# Patient Record
Sex: Male | Born: 1950 | Race: White | Hispanic: No | Marital: Married | Smoking: Never smoker
Health system: Southern US, Community
[De-identification: ages and names within clinical notes are randomized; demographics above are authoritative.]

## PROBLEM LIST (undated history)

## (undated) DIAGNOSIS — Z860101 Personal history of adenomatous and serrated colon polyps: Secondary | ICD-10-CM

## (undated) DIAGNOSIS — I10 Essential (primary) hypertension: Secondary | ICD-10-CM

## (undated) DIAGNOSIS — G250 Essential tremor: Secondary | ICD-10-CM

## (undated) DIAGNOSIS — M1990 Unspecified osteoarthritis, unspecified site: Secondary | ICD-10-CM

## (undated) DIAGNOSIS — C4491 Basal cell carcinoma of skin, unspecified: Secondary | ICD-10-CM

## (undated) DIAGNOSIS — C439 Malignant melanoma of skin, unspecified: Secondary | ICD-10-CM

## (undated) DIAGNOSIS — G709 Myoneural disorder, unspecified: Secondary | ICD-10-CM

## (undated) DIAGNOSIS — K5790 Diverticulosis of intestine, part unspecified, without perforation or abscess without bleeding: Secondary | ICD-10-CM

## (undated) DIAGNOSIS — Z86010 Personal history of colonic polyps: Secondary | ICD-10-CM

## (undated) HISTORY — DX: Myoneural disorder, unspecified: G70.9

## (undated) HISTORY — DX: Diverticulosis of intestine, part unspecified, without perforation or abscess without bleeding: K57.90

## (undated) HISTORY — DX: Personal history of adenomatous and serrated colon polyps: Z86.0101

## (undated) HISTORY — DX: Malignant melanoma of skin, unspecified: C43.9

## (undated) HISTORY — DX: Essential (primary) hypertension: I10

## (undated) HISTORY — PX: COSMETIC SURGERY: SHX468

## (undated) HISTORY — DX: Essential tremor: G25.0

## (undated) HISTORY — DX: Unspecified osteoarthritis, unspecified site: M19.90

## (undated) HISTORY — DX: Basal cell carcinoma of skin, unspecified: C44.91

## (undated) HISTORY — PX: COLONOSCOPY W/ POLYPECTOMY: SHX1380

## (undated) HISTORY — DX: Personal history of colonic polyps: Z86.010

---

## 2002-08-16 ENCOUNTER — Encounter: Payer: Self-pay | Admitting: Orthopedic Surgery

## 2002-08-16 ENCOUNTER — Encounter: Admission: RE | Admit: 2002-08-16 | Discharge: 2002-08-16 | Payer: Self-pay | Admitting: Orthopedic Surgery

## 2002-09-13 ENCOUNTER — Ambulatory Visit (HOSPITAL_BASED_OUTPATIENT_CLINIC_OR_DEPARTMENT_OTHER): Admission: RE | Admit: 2002-09-13 | Discharge: 2002-09-13 | Payer: Self-pay | Admitting: Orthopedic Surgery

## 2002-10-04 HISTORY — PX: SHOULDER ARTHROSCOPY W/ ROTATOR CUFF REPAIR: SHX2400

## 2004-11-10 DIAGNOSIS — C4492 Squamous cell carcinoma of skin, unspecified: Secondary | ICD-10-CM

## 2004-11-10 HISTORY — DX: Squamous cell carcinoma of skin, unspecified: C44.92

## 2006-08-02 ENCOUNTER — Encounter: Admission: RE | Admit: 2006-08-02 | Discharge: 2006-08-02 | Payer: Self-pay | Admitting: Orthopedic Surgery

## 2006-09-21 ENCOUNTER — Ambulatory Visit (HOSPITAL_BASED_OUTPATIENT_CLINIC_OR_DEPARTMENT_OTHER): Admission: RE | Admit: 2006-09-21 | Discharge: 2006-09-21 | Payer: Self-pay | Admitting: Orthopedic Surgery

## 2007-05-15 DIAGNOSIS — C4492 Squamous cell carcinoma of skin, unspecified: Secondary | ICD-10-CM | POA: Insufficient documentation

## 2007-05-15 HISTORY — DX: Squamous cell carcinoma of skin, unspecified: C44.92

## 2009-05-07 ENCOUNTER — Inpatient Hospital Stay (HOSPITAL_COMMUNITY): Admission: RE | Admit: 2009-05-07 | Discharge: 2009-05-09 | Payer: Self-pay | Admitting: Orthopedic Surgery

## 2010-10-04 HISTORY — PX: KNEE SURGERY: SHX244

## 2010-10-28 IMAGING — CR DG CHEST 2V
2 series · 2 of 2 positions shown · non-contrast
Comparison: None

CLINICAL DATA: Preop for degenerative change of the right knee

CHEST - 2 VIEW

[view not recorded (1 of 2)]
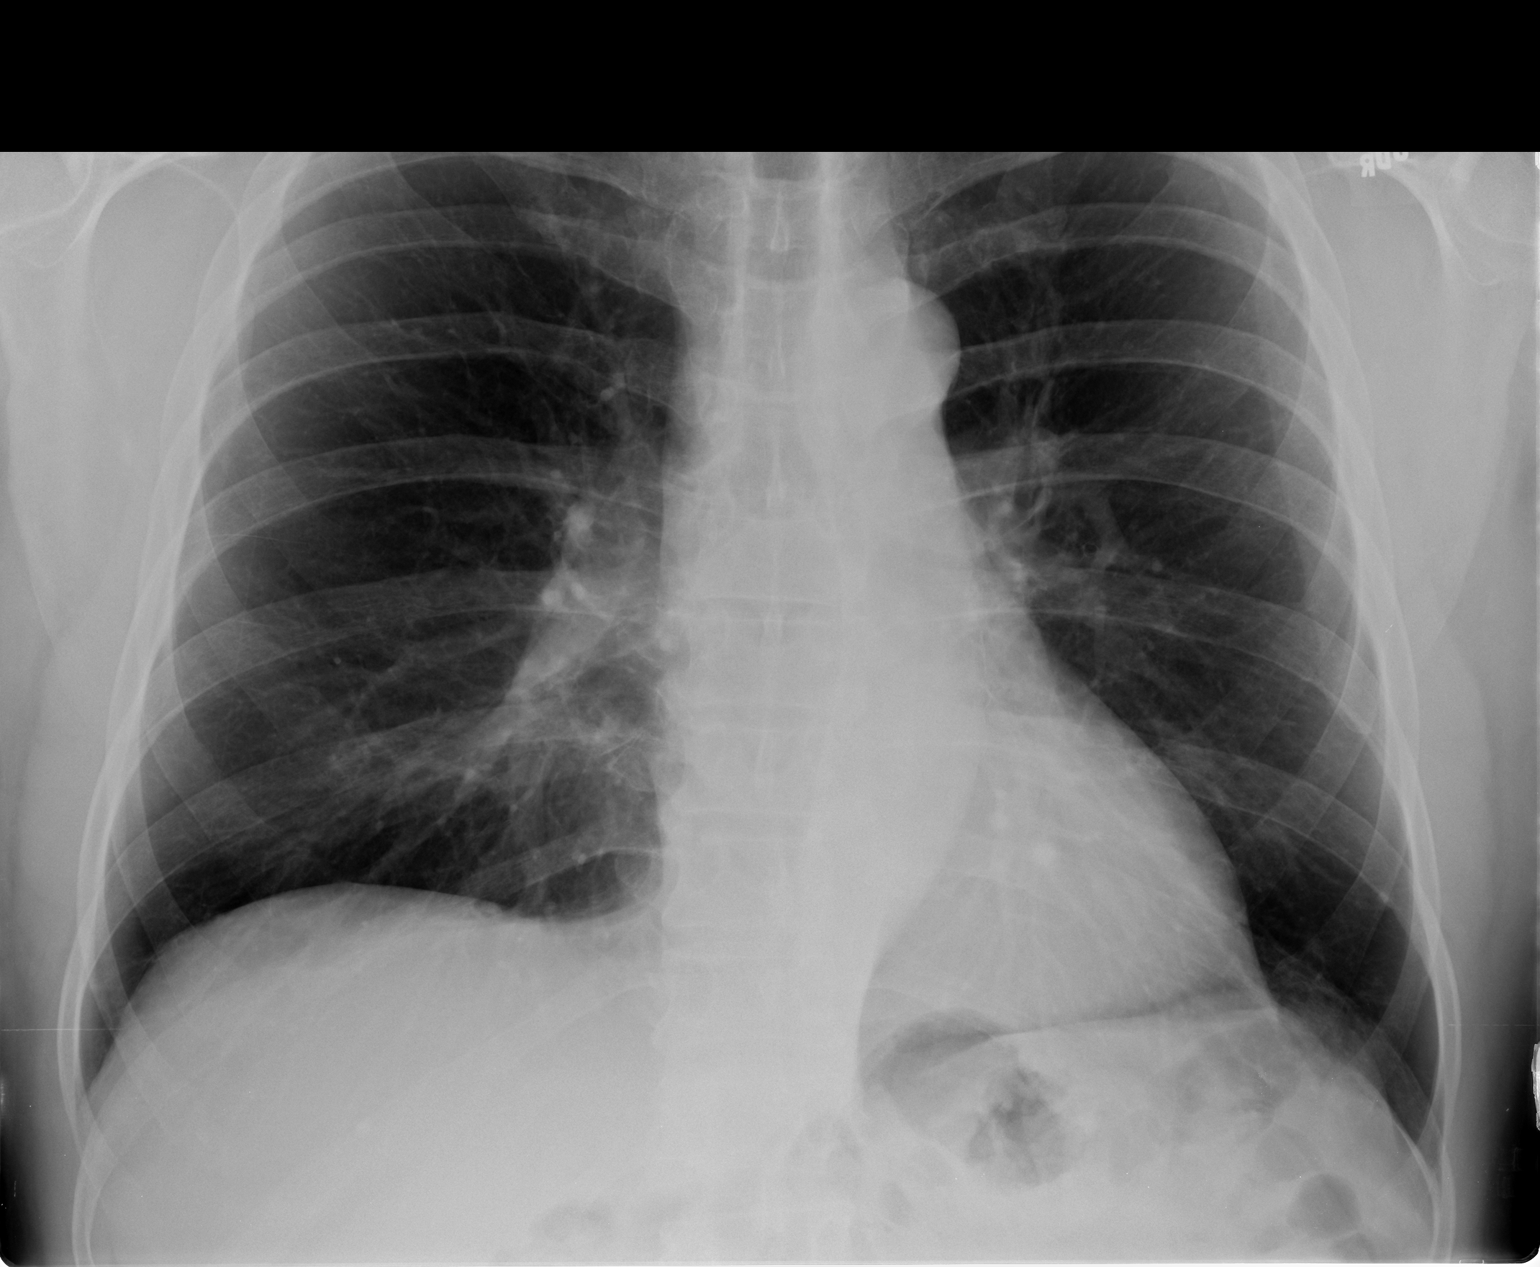

[view not recorded (2 of 2)]
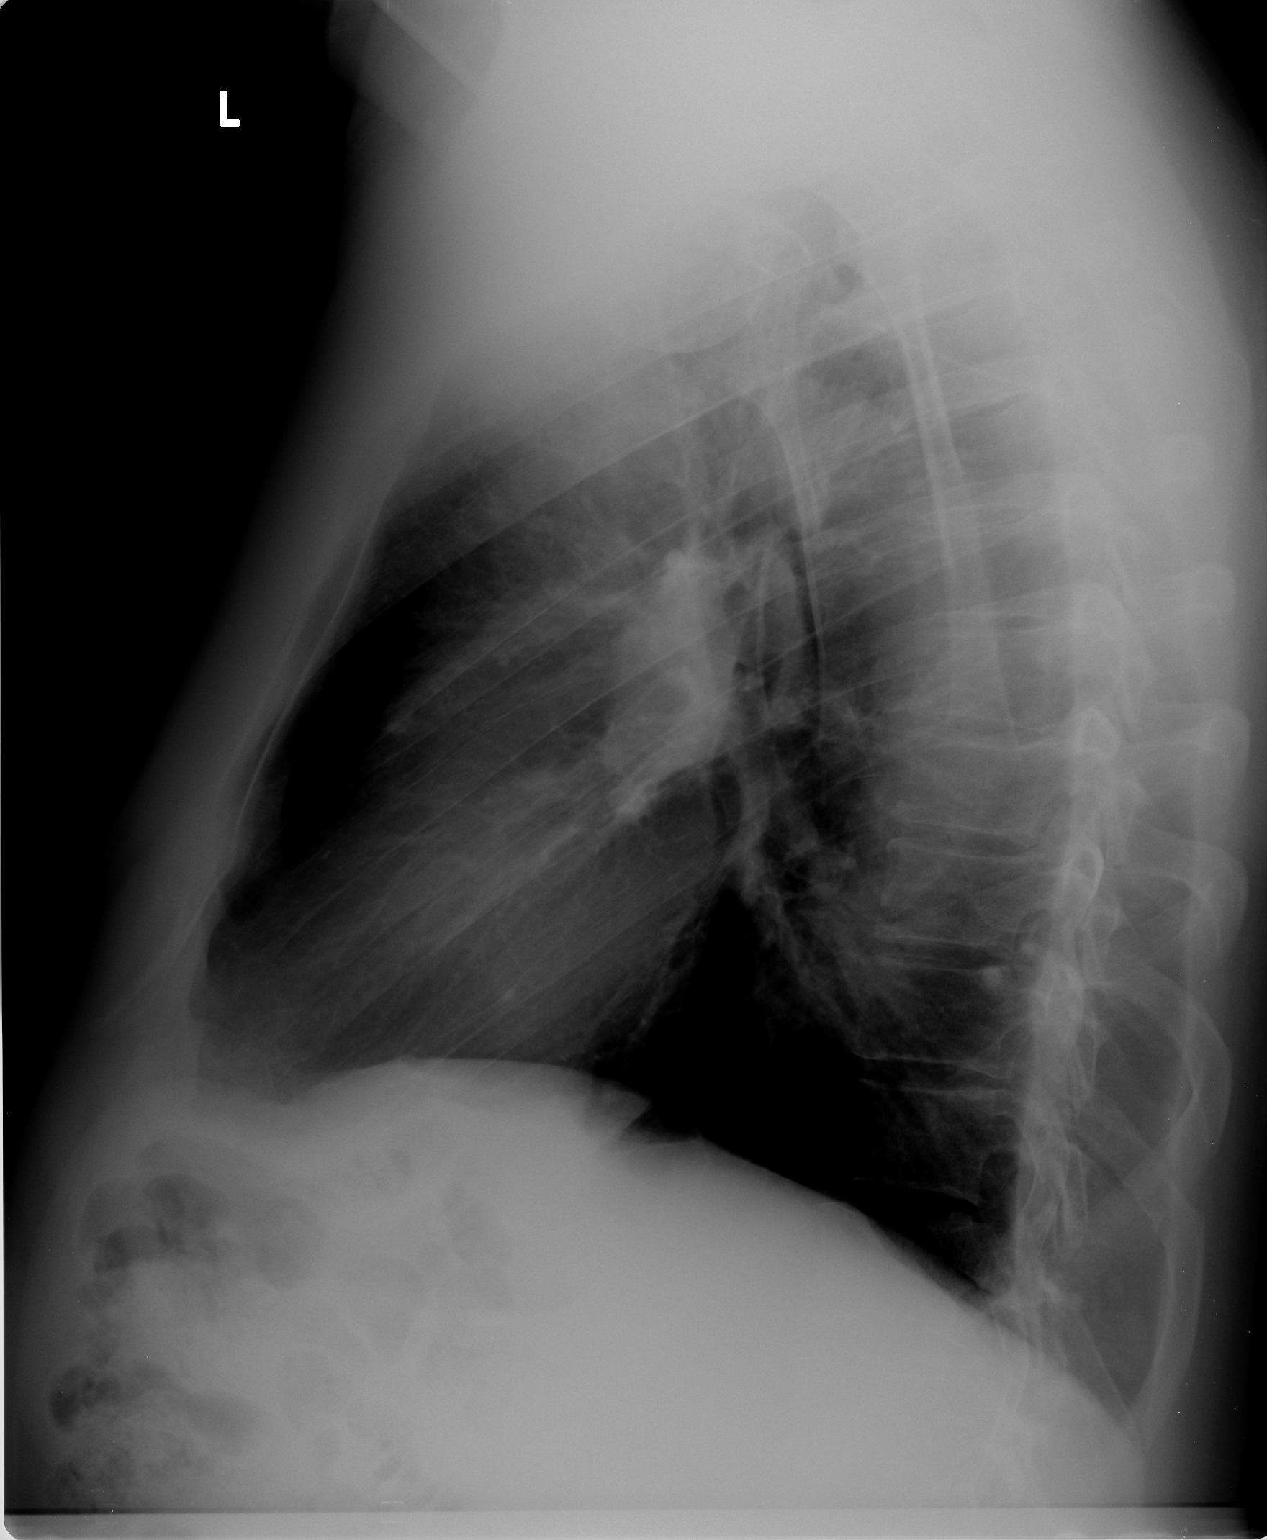

[2 of 2 positions shown; findings below may reference images not displayed]

FINDINGS: Small somewhat dense nodules are noted bilaterally most
consistent with granulomas, partially calcified.  No active
infiltrate or effusion is seen.  The heart is within normal limits
in size.  There are degenerative changes in the thoracic spine.
IMPRESSION: No active lung disease.  Probable faintly calcified granulomas
bilaterally.  Compare with prior or follow-up chest x-ray.

## 2011-01-09 LAB — PROTIME-INR
INR: 1.1 (ref 0.00–1.49)
Prothrombin Time: 13.8 seconds (ref 11.6–15.2)
Prothrombin Time: 14.9 seconds (ref 11.6–15.2)

## 2011-01-09 LAB — BASIC METABOLIC PANEL
BUN: 6 mg/dL (ref 6–23)
CO2: 29 mEq/L (ref 19–32)
Calcium: 8.1 mg/dL — ABNORMAL LOW (ref 8.4–10.5)
Creatinine, Ser: 0.79 mg/dL (ref 0.4–1.5)
Creatinine, Ser: 0.87 mg/dL (ref 0.4–1.5)
GFR calc Af Amer: >60 mL/min (ref >60)
GFR calc Af Amer: >60 mL/min (ref >60)
GFR calc non Af Amer: >60 mL/min (ref >60)
GFR calc non Af Amer: >60 mL/min (ref >60)
Potassium: 3.4 mEq/L — ABNORMAL LOW (ref 3.5–5.1)
Sodium: 135 mEq/L (ref 135–145)

## 2011-01-09 LAB — CBC
MCHC: 35.5 g/dL (ref 30.0–36.0)
MCV: 95.2 fL (ref 78.0–100.0)
Platelets: 148 10*3/uL — ABNORMAL LOW (ref 150–400)
Platelets: 183 10*3/uL (ref 150–400)
RBC: 3.41 MIL/uL — ABNORMAL LOW (ref 4.22–5.81)
RBC: 3.67 MIL/uL — ABNORMAL LOW (ref 4.22–5.81)
RBC: 4.47 MIL/uL (ref 4.22–5.81)
RDW: 12.6 % (ref 11.5–15.5)
WBC: 5 10*3/uL (ref 4.0–10.5)
WBC: 7.1 10*3/uL (ref 4.0–10.5)

## 2011-01-09 LAB — TYPE AND SCREEN
ABO/RH(D): O NEG
Antibody Screen: NEGATIVE

## 2011-01-10 LAB — COMPREHENSIVE METABOLIC PANEL
ALT: 27 U/L (ref 0–53)
AST: 22 U/L (ref 0–37)
Albumin: 4.3 g/dL (ref 3.5–5.2)
Calcium: 9.9 mg/dL (ref 8.4–10.5)
GFR calc Af Amer: >60 mL/min (ref >60)
Sodium: 141 mEq/L (ref 135–145)
Total Protein: 6.9 g/dL (ref 6.0–8.3)

## 2011-01-10 LAB — URINALYSIS, ROUTINE W REFLEX MICROSCOPIC
Glucose, UA: NEGATIVE mg/dL
Ketones, ur: NEGATIVE mg/dL
Protein, ur: NEGATIVE mg/dL
Urobilinogen, UA: 0.2 mg/dL (ref 0.0–1.0)

## 2011-01-10 LAB — TYPE AND SCREEN
ABO/RH(D): O NEG
Antibody Screen: NEGATIVE

## 2011-01-10 LAB — ABO/RH: ABO/RH(D): O NEG

## 2011-02-16 NOTE — Op Note (Signed)
NAME:  AHARON, CARRIERE NO.:  [id number]   MEDICAL RECORD NO.:  [id number]          PATIENT TYPE:  INP   LOCATION:  5030                         FACILITY:  [facility]   PHYSICIAN:  Loreta Ave, M.D. DATE OF BIRTH:  1951-07-15   DATE OF PROCEDURE:  05/07/2009  DATE OF DISCHARGE:                               OPERATIVE REPORT   PREOPERATIVE DIAGNOSIS:  End-stage degenerative arthritis, right knee  with varus alignment.   POSTOPERATIVE DIAGNOSIS:  End-stage degenerative arthritis, right knee  with varus alignment.   PROCEDURE:  Right total knee replacement, modified minimally invasive  approach, Stryker triathlon prosthesis.  Soft tissue balancing.  Cemented pegged posterior stabilized #7 femoral component.  Cemented #7  tibial component with 11-mm polyethylene insert.  Resurfacing patellar  component 38 mm pegged cemented medial offset.   SURGEON:  Loreta Ave, MD   ASSISTANT:  Genene Churn. Barry Dienes, [place], present throughout the entire case and  necessary for timely completion of procedure.   ANESTHESIA:  General.   ESTIMATED BLOOD LOSS:  Minimal.   SPECIMENS:  None.   CULTURES:  None.   COMPLICATIONS:  None.   DRESSINGS:  Soft compressive with knee immobilizer.   DRAIN:  Hemovac x1.   TOURNIQUET TIME:  1 hour and 5 minutes.   PROCEDURE:  The patient was brought to the operating room, placed on  operating table in supine position.  After adequate anesthesia had been  obtained, knee examined.  Varus alignment, partially correctable.  Pretty much full extension, flexion 100 degrees.  Tourniquet applied.  Prepped and draped in usual sterile fashion.  Exsanguinated with  elevation and Esmarch.  Tourniquet inflated to 300 mmHg.  Straight  incision above the patella down to tibial tubercle.  Skin and  subcutaneous tissue divided.  Medial arthrotomy vastus splitting  preserving quad tendon.  Knee exposed.  Grade 4 changes throughout.  Remnants of menisci,  cruciate ligaments, loose body spurs removed.  Distal femur exposed.  Intramedullary guide.  Distal cut 10 mm set at 5  degrees of valgus.  Using epicondylar axis sized, cut, and fitted for #7  femoral component.  Tibia exposed.  Erosion medially.  Extramedullary  guide.  Three degrees posterior slope cut resected below the defect  medially.  Size #7 component.  Recess examined to be sure all spurs  removed.  Thorough irrigation.  Patella exposed, measured, posterior 10  mm removed, sized, drilled, and fitted with a 38-mm component.  Trial  was put in place, #7 above and below.  With the 11-mm insert and 38  patella, I had excellent mechanical axis, full extension, full flexion,  good alignment, good stability, good patellofemoral tracking.  Tibia was  marked for rotation and hand reamed.  All trials removed.  Copious  irrigation with a pulse irrigating device.  Cement prepared, placed on  all components, firmly seated.  Excess cement removed.  Polyethylene  attached to tibia.  Knee reduced.  Patella held with a clamp.  Once  cement hardened, it was reexamined, again pleased with alignment,  stability, and tracking.  Hemovac placed and brought out through a  separate stab  wound.  Arthrotomy closed with #1 Vicryl.  Skin and  subcutaneous tissue with Vicryl and staples.  Knee injected with  Marcaine.  Hemovac clamped.  Sterile compressive dressing applied.  Tourniquet was inflated and removed.  Knee immobilizer applied.  Anesthesia reversed.  Brought to the room.  Tolerated surgery well.  No  complications.      Loreta Ave, M.D.  Electronically Signed     DFM/MEDQ  D:  05/08/2009  T:  05/08/2009  Job:  [id number]

## 2011-02-19 NOTE — Op Note (Signed)
NAME:  Henry Knight, Henry Knight NO.:  [id number]   MEDICAL RECORD NO.:  [id number]          PATIENT TYPE:  AMB   LOCATION:  DSC                          FACILITY:  [facility]   PHYSICIAN:  Loreta Ave, M.D. DATE OF BIRTH:  April 07, 1951   DATE OF PROCEDURE:  09/21/2006  DATE OF DISCHARGE:                               OPERATIVE REPORT   PREOPERATIVE DIAGNOSES:  Right shoulder impingement, rotator cuff tear,  distal clavicle osteolysis.   POSTOPERATIVE DIAGNOSES:  Right shoulder impingement, rotator cuff tear,  distal clavicle osteolysis, with significant complete rotator cuff tear,  posterior labrum tear, and marked tearing of intra-articular portion of  biceps tendon.   PROCEDURES:  1. Right shoulder exam under anesthesia, arthroscopy, debridement of      biceps, labrum, rotator cuff.  2. Acromioplasty, coracoacromial ligament release, excision of distal      clavicle, mini-open repair of rotator cuff tear with Concept Repair      system and FiberWire suture, repair of interval tear with FiberWire      suture, resection and reimplantation of biceps tendon into      bicipital groove.   SURGEON:  Loreta Ave, M.D.   ASSISTANT:  Genene Churn. Barry Dienes, P.A. (present throughout the entire case).   ANESTHESIA:  General.   BLOOD LOSS:  Minimal.   SPECIMENS:  None.   CULTURES:  None.   COMPLICATIONS:  None.   DRESSING:  Soft compressive with shoulder immobilizer.   DESCRIPTION OF PROCEDURES:  The patient was brought to the operating  room and placed on the operating table in supine position.  After  adequate anesthesia had been obtained, the right shoulder was examined.  Full motion and stable shoulder.  Placed in a beach-chair position on  the shoulder positioner and prepped and draped in the usual sterile  fashion.  Three portals, anterior, posterior and lateral.  Shoulder  entered with a blunt obturator, distended and inspected.  Posterior  labrum tear debrided.   Biceps tendon had marked tearing of intra-  articular portion, compromising more than 50% of the tendon.  Intra-  articular portion debrided and freed up for reimplantation.  Moderate  amount of partial tearing, undersurface of the cuff.  One area of full-  thickness tear, anterior half of supraspinatus tendon.  All of this  debrided from below.  Articular cartilage looked good in the shoulder.  Canal redirected subacromially.  Much more significant tearing, entire  supraspinatus tendon, with a little bit of retraction.  Interval tear  anteriorly.  Type 3 acromion.  Obvious impedance plethysmograph.  Grade  4 changes, AC joint with spurring of distal clavicle.  Bursa resected,  cuff debrided.  Acromioplasty to a type 1 acromion with shaver and high-  speed bur.  CA ligament released with cautery.  Distal clavicle exposed  lateral centimeter, and peri-articular spurs resected.  Adequacy of  decompression confirmed throughout.  Instruments and fluid removed.  Deltoid-splitting incision.  Adequacy of decompression again confirmed.  Tip was mobilized.  Interval tear recognized anteriorly that did not  extend all the way over laterally.  The cuff was mobilized, captured  with  weave, FiberWire suture.  Interval tear closed with a running  FiberWire suture.  Biceps tendon brought out to the bicipital groove,  resected back to healthy tissue and captured with a weave FiberWire  suture.  The cuff was then repaired with the  Concept Repair system with drill holes placed in the tuberosity, sutures  brought through these, arm abducted and sutures tied firmly over a bony  bridge.  Interval tear closed with the running FiberWire.  The biceps  was then reimplanted into the humerus in the bicipital groove through a  large drill hole with 2 small exiting drill holes.  Sutures brought in  the large hole out both sides, pulled up over the top of the tendon,  which was pulled down into the humerus.  Firmly  tied.  Nice firm  reimplantation and cuff repair.  Wound irrigated.  Full passive motion.  Deltoid closed with Vicryl.  Skin closed with Vicryl.  Portals were  closed with nylon.  Sterile compressive dressing applied.  Shoulder  immobilizer applied.  Anesthesia reversed.  Brought to the recovery  room.  Tolerated surgery well with no complications.      Loreta Ave, M.D.  Electronically Signed     DFM/MEDQ  D:  09/21/2006  T:  09/21/2006  Job:  [id number]

## 2011-02-19 NOTE — Op Note (Signed)
   NAME:  Henry Knight, Henry Knight NO.:  [id number]   MEDICAL RECORD NO.:  [id number]                   PATIENT TYPE:  AMB   LOCATION:  DSC                                  FACILITY:  [facility]   PHYSICIAN:  Loreta Ave, M.D.              DATE OF BIRTH:  01/06/1951   DATE OF PROCEDURE:  09/13/2002  DATE OF DISCHARGE:                                 OPERATIVE REPORT   PREOPERATIVE DIAGNOSIS:  Degenerative medial meniscus tear, left knee.   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   PROCEDURES:  Left knee examination under anesthesia, arthroscopy, partial  medial and lateral meniscectomies, with chondroplasty of medial and lateral  compartments.   SURGEON:  Loreta Ave, M.D.   ASSISTANT:  Arlys John D. Petrarca, P.A.-C.   ANESTHESIA:  Knee block with sedation.   CULTURES:  None.   COMPLICATIONS:  None.   DRAINS:  None.   DRESSING:  Soft compressive.   DESCRIPTION OF PROCEDURE:  The patient was brought to the operating room and  placed on the operating table in the supine position.  After adequate  anesthesia had been obtained, the left knee examined.  Good motion, good  stability, positive McMurray medially.  Tourniquet and leg holder applied.  Left prepped and draped in the usual sterile fashion.  Three portals  created, one superolateral, one each medial and lateral parapatellar.  Inflow catheter introduced, the knee distended, arthroscope introduced, knee  inspected.  Reactive synovitis throughout debrided.  The patellofemoral  joint had mild degenerative changes, good tracking.  Cruciate ligaments  intact.  The lateral compartment had some grade 2 changes on the tibia and  on the femur, treated with chondroplasty.  Complex tearing of the posterior  third at the posterior attachment.  Saucerized out, tapered in smoothly with  baskets  and shaver.  Remaining meniscus intact.  Medial compartment  extensive tearing, entire posterior half of medial meniscus.  Posterior half  removed, tapered into remaining meniscus.  Grade 3 changes on the tibia and  on the femur.  Chondroplasty to a smooth surface.  All chondral loose bodies  were removed.  The entire knee examined and no other findings appreciated.  Instruments and fluid removed.  Portals and the knee injected with Marcaine.  Portals closed with 4-0 nylon.  Sterile compressive dressing applied.  Anesthesia reversed.  Brought to the recovery room.  Tolerated the surgery  well with no complications.                                              Loreta Ave, M.D.   DFM/MEDQ  D:  09/13/2002  T:  09/14/2002  Job:  [id number]

## 2012-04-14 ENCOUNTER — Other Ambulatory Visit: Payer: Self-pay

## 2012-04-14 MED ORDER — IBUPROFEN 800 MG PO TABS: 800.0000 mg | ORAL_TABLET | Freq: Three times a day (TID) | ORAL | Status: AC | PRN

## 2012-04-18 ENCOUNTER — Other Ambulatory Visit: Payer: Self-pay | Admitting: *Deleted

## 2012-04-18 MED ORDER — LORAZEPAM 1 MG PO TABS: 1.0000 mg | ORAL_TABLET | Freq: Two times a day (BID) | ORAL | Status: AC | PRN

## 2012-07-20 ENCOUNTER — Other Ambulatory Visit: Payer: Self-pay | Admitting: Radiology

## 2012-07-28 ENCOUNTER — Ambulatory Visit (INDEPENDENT_AMBULATORY_CARE_PROVIDER_SITE_OTHER): Payer: BC Managed Care – PPO | Admitting: Family Medicine

## 2012-07-28 VITALS — BP 125/95 | HR 77 | Temp 98.3°F | Resp 18 | Wt 234.0 lb

## 2012-07-28 DIAGNOSIS — G250 Essential tremor: Secondary | ICD-10-CM | POA: Insufficient documentation

## 2012-07-28 MED ORDER — LORAZEPAM 1 MG PO TABS: 1.0000 mg | ORAL_TABLET | Freq: Two times a day (BID) | ORAL | Status: DC | PRN

## 2012-07-28 NOTE — Progress Notes (Signed)
[facility] [address] [place] [address] [phone]  Date:  07/28/2012   Name:  Henry Knight   DOB:  [birth year]   MRN:  [id number]  PCP:  No primary provider on file.    Chief Complaint: Anxiety   History of Present Illness:  Henry Knight is a 61 y.o. very pleasant male patient who presents with the following:  He is here today for an rx refill.  He has a history of essential tremor which has been present since he was a child.  He has had the best results with ativan- he has tried several different medications over his lifetime.   He uses the lorazepam prn- he does not like to use it unless it is necessary.  He does not usually take it two days in a row.   He also occasionally uses tramadol for pain- he is aware that using these medications in combination can cause increased sedation.  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   There is no problem list on file for this patient.   Past Medical History  Diagnosis Date  . Arthritis   . Neuromuscular disorder   . Essential tremor     Past Surgical History  Procedure Date  . Cosmetic surgery   . Joint replacement     History  Substance Use Topics  . Smoking status: Never Smoker   . Smokeless tobacco: Not on file  . Alcohol Use: 0.6 oz/week    1 Glasses of wine per week    Family History  Problem Relation Age of Onset  . Heart disease Father   . Obesity Brother     Allergies  Allergen Reactions  . Penicillins     Medication list has been reviewed and updated.  No current outpatient prescriptions on file prior to visit.    Review of Systems:  As per HPI- otherwise negative.   Physical Examination: Filed Vitals:   07/28/12 1326  BP: 125/95  Pulse: 77  Temp: 98.3 F (36.8 C)  Resp: 18   Filed Vitals:   07/28/12 1326  Weight: 234 lb (106.142 kg)   There is no height on file to calculate BMI. Ideal Body Weight:    GEN: WDWN, NAD, Non-toxic, A & O x 3 HEENT:  Atraumatic, Normocephalic. Neck supple. No masses, No LAD. Ears and Nose: No external deformity. CV: RRR, No M/G/R. No JVD. No thrill. No extra heart sounds. PULM: CTA B, no wheezes, crackles, rhonchi. No retractions. No resp. distress. No accessory muscle use. ABD: S, NT, ND, +BS. No rebound. No HSM. EXTR: No c/c/e NEURO Normal gait. Minimal tremor evident PSYCH: Normally interactive. Conversant. Not depressed or anxious appearing.  Calm demeanor.    Assessment and Plan: 1. Essential tremor  LORazepam (ATIVAN) 1 MG tablet   Refilled lorazepam for his tremor.  He has used this for some time without a problem.   Abbe Amsterdam, MD

## 2013-01-31 ENCOUNTER — Other Ambulatory Visit: Payer: Self-pay | Admitting: Family Medicine

## 2013-01-31 ENCOUNTER — Other Ambulatory Visit: Payer: Self-pay | Admitting: Internal Medicine

## 2013-01-31 NOTE — Telephone Encounter (Signed)
Dr Patsy Lager, it looks like you last D/W pt in 07/2012. Do you want to RF or pt need OV first?

## 2013-05-03 ENCOUNTER — Other Ambulatory Visit: Payer: Self-pay | Admitting: Physician Assistant

## 2013-06-08 ENCOUNTER — Other Ambulatory Visit: Payer: Self-pay | Admitting: Family Medicine

## 2013-06-08 DIAGNOSIS — G894 Chronic pain syndrome: Secondary | ICD-10-CM

## 2013-07-11 ENCOUNTER — Ambulatory Visit (INDEPENDENT_AMBULATORY_CARE_PROVIDER_SITE_OTHER): Payer: BC Managed Care – PPO | Admitting: Emergency Medicine

## 2013-07-11 VITALS — BP 150/88 | HR 65 | Temp 98.0°F | Resp 16 | Ht 73.0 in | Wt 233.8 lb

## 2013-07-11 DIAGNOSIS — Z23 Encounter for immunization: Secondary | ICD-10-CM

## 2013-07-11 DIAGNOSIS — G250 Essential tremor: Secondary | ICD-10-CM

## 2013-07-11 MED ORDER — MELOXICAM 15 MG PO TABS: 15.0000 mg | ORAL_TABLET | Freq: Every day | ORAL | Status: DC

## 2013-07-11 MED ORDER — LORAZEPAM 1 MG PO TABS: 1.0000 mg | ORAL_TABLET | Freq: Two times a day (BID) | ORAL | Status: DC | PRN

## 2013-07-11 NOTE — Patient Instructions (Signed)
Tremor  Tremor is a rhythmic, involuntary muscular contraction characterized by oscillations (to-and-fro movements) of a part of the body. The most common of all involuntary movements, tremor can affect various body parts such as the hands, head, facial structures, vocal cords, trunk, and legs; most tremors, however, occur in the hands. Tremor often accompanies neurological disorders associated with aging. Although the disorder is not life-threatening, it can be responsible for functional disability and social embarrassment.  TREATMENT   There are many types of tremor and several ways in which tremor is classified. The most common classification is by behavioral context or position. There are five categories of tremor within this classification: resting, postural, kinetic, task-specific, and psychogenic. Resting or static tremor occurs when the muscle is at rest, for example when the hands are lying on the lap. This type of tremor is often seen in patients with Parkinson's disease. Postural tremor occurs when a patient attempts to maintain posture, such as holding the hands outstretched. Postural tremors include physiological tremor, essential tremor, tremor with basal ganglia disease (also seen in patients with Parkinson's disease), cerebellar postural tremor, tremor with peripheral neuropathy, post-traumatic tremor, and alcoholic tremor. Kinetic or intention (action) tremor occurs during purposeful movement, for example during finger-to-nose testing. Task-specific tremor appears when performing goal-oriented tasks such as handwriting, speaking, or standing. This group consists of primary writing tremor, vocal tremor, and orthostatic tremor. Psychogenic tremor occurs in both older and younger patients. The key feature of this tremor is that it dramatically lessens or disappears when the patient is distracted.  PROGNOSIS  There are some treatment options available for tremor; the appropriate treatment depends on  accurate diagnosis of the cause. Some tremors respond to treatment of the underlying condition, for example in some cases of psychogenic tremor treating the patient's underlying mental problem may cause the tremor to disappear. Also, patients with tremor due to Parkinson's disease may be treated with Levodopa drug therapy. Symptomatic drug therapy is available for several other tremors as well. For those cases of tremor in which there is no effective drug treatment, physical measures such as teaching the patient to brace the affected limb during the tremor are sometimes useful. Surgical intervention such as thalamotomy or deep brain stimulation may be useful in certain cases.  Document Released: 09/10/2002 Document Revised: 12/13/2011 Document Reviewed: 09/20/2005  ExitCare® Patient Information ©2014 [employer].

## 2013-07-11 NOTE — Progress Notes (Signed)
[facility] [address], [place] [address] [phone]  Date:  07/11/2013   Name:  Henry Knight   DOB:  1951-07-21   MRN:  [id number]  PCP:  No primary provider on file.    Chief Complaint: Medication Refill and Immunizations   History of Present Illness:  Henry Knight is a 62 y.o. very pleasant male patient who presents with the following:  Life long history of essential tremor evaluated at [facility] and [facility].  Has been maintained on ativan and needs a refill on his medication.  No adverse effects.  No untoward sedation.  No improvement with over the counter medications or other home remedies. Denies other complaint or health concern today.   Patient Active Problem List   Diagnosis Date Noted  . Essential tremor 07/28/2012    Past Medical History  Diagnosis Date  . Arthritis   . Neuromuscular disorder   . Essential tremor     Past Surgical History  Procedure Laterality Date  . Cosmetic surgery    . Joint replacement      History  Substance Use Topics  . Smoking status: Never Smoker   . Smokeless tobacco: Not on file  . Alcohol Use: 0.6 oz/week    1 Glasses of wine per week    Family History  Problem Relation Age of Onset  . Heart disease Father   . Obesity Brother     Allergies  Allergen Reactions  . Penicillins     Medication list has been reviewed and updated.  Current Outpatient Prescriptions on File Prior to Visit  Medication Sig Dispense Refill  . ibuprofen (ADVIL,MOTRIN) 800 MG tablet Take 800 mg by mouth every 8 (eight) hours as needed.      Marland Kitchen LORazepam (ATIVAN) 1 MG tablet Take 1 tablet (1 mg total) by mouth 2 (two) times daily as needed. For tremor  60 tablet  1  . LORazepam (ATIVAN) 1 MG tablet TAKE 1 TABLET 2 TIMES DAILY AS NEEDED FOR TREMOR  60 tablet  1  . traMADol (ULTRAM) 50 MG tablet Take 1 tablet (50 mg total) by mouth every 6 (six) hours as needed for pain. Office visit needed prior to  further refills  30 tablet  0   No current facility-administered medications on file prior to visit.    Review of Systems:  As per HPI, otherwise negative.    Physical Examination: Filed Vitals:   07/11/13 0915  BP: 150/88  Pulse: 65  Temp: 98 F (36.7 C)  Resp: 16   Filed Vitals:   07/11/13 0915  Height: 6\' 1"  (1.854 m)  Weight: 233 lb 12.8 oz (106.051 kg)   Body mass index is 30.85 kg/(m^2). Ideal Body Weight: Weight in (lb) to have BMI = 25: 189.1   GEN: WDWN, NAD, Non-toxic, Alert & Oriented x 3 HEENT: Atraumatic, Normocephalic.  Ears and Nose: No external deformity. EXTR: No clubbing/cyanosis/edema NEURO: Normal gait. Mild resting tremor. Controlled by holding onto his arms and legs PSYCH: Normally interactive. Conversant. Not depressed or anxious appearing.  Calm demeanor.    Assessment and Plan: Essential tremor Ativan Follow up in 6 months Flu shot   Signed,  Phillips Odor, MD

## 2013-10-06 ENCOUNTER — Other Ambulatory Visit: Payer: Self-pay | Admitting: Family Medicine

## 2013-10-11 ENCOUNTER — Other Ambulatory Visit: Payer: Self-pay | Admitting: Family Medicine

## 2013-10-11 DIAGNOSIS — G894 Chronic pain syndrome: Secondary | ICD-10-CM

## 2013-10-11 NOTE — Telephone Encounter (Signed)
Pt here in October. Told to follow up in 6 months. Ok to refill for 3 months.

## 2014-02-15 ENCOUNTER — Other Ambulatory Visit: Payer: Self-pay | Admitting: Family Medicine

## 2014-02-15 DIAGNOSIS — G894 Chronic pain syndrome: Secondary | ICD-10-CM

## 2014-02-15 NOTE — Telephone Encounter (Signed)
Dr Lorelei Pont, do you want to RF? Pt was seen in Jan by Dr Ouida Sills for other issues and told to f/up in 6 mos, but she hasn't been seen by you or for this med since Oct.

## 2014-04-22 ENCOUNTER — Other Ambulatory Visit: Payer: Self-pay | Admitting: Family Medicine

## 2014-04-25 ENCOUNTER — Other Ambulatory Visit: Payer: Self-pay | Admitting: Emergency Medicine

## 2014-05-01 ENCOUNTER — Encounter: Payer: Self-pay | Admitting: Nurse Practitioner

## 2014-05-01 ENCOUNTER — Ambulatory Visit (INDEPENDENT_AMBULATORY_CARE_PROVIDER_SITE_OTHER): Payer: BC Managed Care – PPO | Admitting: Nurse Practitioner

## 2014-05-01 ENCOUNTER — Other Ambulatory Visit: Payer: Self-pay | Admitting: Family Medicine

## 2014-05-01 VITALS — BP 180/108 | HR 57 | Temp 97.9°F | Ht 73.0 in | Wt 232.0 lb

## 2014-05-01 DIAGNOSIS — L723 Sebaceous cyst: Secondary | ICD-10-CM

## 2014-05-01 DIAGNOSIS — M129 Arthropathy, unspecified: Secondary | ICD-10-CM

## 2014-05-01 DIAGNOSIS — G894 Chronic pain syndrome: Secondary | ICD-10-CM

## 2014-05-01 DIAGNOSIS — G252 Other specified forms of tremor: Secondary | ICD-10-CM

## 2014-05-01 DIAGNOSIS — F411 Generalized anxiety disorder: Secondary | ICD-10-CM

## 2014-05-01 DIAGNOSIS — L720 Epidermal cyst: Secondary | ICD-10-CM

## 2014-05-01 DIAGNOSIS — R030 Elevated blood-pressure reading, without diagnosis of hypertension: Secondary | ICD-10-CM

## 2014-05-01 DIAGNOSIS — IMO0001 Reserved for inherently not codable concepts without codable children: Secondary | ICD-10-CM

## 2014-05-01 DIAGNOSIS — G250 Essential tremor: Secondary | ICD-10-CM

## 2014-05-01 DIAGNOSIS — M1990 Unspecified osteoarthritis, unspecified site: Secondary | ICD-10-CM

## 2014-05-01 MED ORDER — IBUPROFEN 800 MG PO TABS: 800.0000 mg | ORAL_TABLET | Freq: Two times a day (BID) | ORAL | Status: DC | PRN

## 2014-05-01 MED ORDER — LORAZEPAM 1 MG PO TABS: 1.0000 mg | ORAL_TABLET | Freq: Every day | ORAL | Status: DC | PRN

## 2014-05-01 MED ORDER — TRAMADOL HCL 50 MG PO TABS: 50.0000 mg | ORAL_TABLET | Freq: Every evening | ORAL | Status: DC | PRN

## 2014-05-01 NOTE — Progress Notes (Signed)
Pre visit review using our clinic review tool, if applicable. No additional management support is needed unless otherwise documented below in the visit note. 

## 2014-05-01 NOTE — Patient Instructions (Signed)
I am concerned about your blood pressure. Please check 3 blood pressures at home. Feet should be flat on floor, cuff should be at level of heart, sit for 5 minutes before taking reading. Check at same time each day. Bring numbers with you to next appointment.  Please schedule follow up visit within the next 4 weeks. We will do blood work at that time, so consider a morning appointment & do not eat 8 hours before arrival. Water is OK. I recommend tetanus vaccine at that time as well .  Go to www.medicare.gov to learn more about medicare. Start application process 6 months before you turn 59.  I will return records to you at follow up appointment. Nice to see you.

## 2014-05-02 ENCOUNTER — Encounter: Payer: Self-pay | Admitting: Nurse Practitioner

## 2014-05-02 DIAGNOSIS — L720 Epidermal cyst: Secondary | ICD-10-CM | POA: Insufficient documentation

## 2014-05-02 DIAGNOSIS — F4010 Social phobia, unspecified: Secondary | ICD-10-CM | POA: Insufficient documentation

## 2014-05-02 DIAGNOSIS — I10 Essential (primary) hypertension: Secondary | ICD-10-CM | POA: Insufficient documentation

## 2014-05-02 DIAGNOSIS — M159 Polyosteoarthritis, unspecified: Secondary | ICD-10-CM | POA: Insufficient documentation

## 2014-05-02 NOTE — Progress Notes (Signed)
Subjective:     Henry Knight is a 63 y.o. male who wishes to establish care. Most recent health care provided by [facility]. Mr Chasteen gives history of essential tremor for which he saw Dr Erling Cruz one time. He uses ativan in social situations when anxiety makes tremor worse. He has significant history of MSK pain for which he has seen Dr Percell Miller who performed R KR and R shoulder repair. He has also seen acupuncturist in past for relief of arthritis pain in hands & feet. He uses 800 mg advil bid and tramadol 2-3 times weekly for pain. Medications such as narcotics keep him awake.  He has elevated BP in ofc today-states he took HCTZ in past, but stopped medication. 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  The following portions of the patient's history were reviewed and updated as appropriate: allergies, current medications, past family history, past medical history, past social history, past surgical history and problem list.  Review of Systems Pertinent items are noted in HPI.    Objective:    BP 180/108  Pulse 57  Temp(Src) 97.9 F (36.6 C) (Temporal)  Ht 6\' 1"  (1.854 m)  Wt 232 lb (105.235 kg)  BMI 30.62 kg/m2  SpO2 96% BP 180/108  Pulse 57  Temp(Src) 97.9 F (36.6 C) (Temporal)  Ht 6\' 1"  (1.854 m)  Wt 232 lb (105.235 kg)  BMI 30.62 kg/m2  SpO2 96% General appearance: alert, cooperative, appears stated age, mild distress and appears anxious, nervous Head: Normocephalic, without obvious abnormality, atraumatic Eyes: negative findings: lids and lashes normal and conjunctivae and sclerae normal Lungs: clear to auscultation bilaterally Heart: regular rate and rhythm, S1, S2 normal, no murmur, click, rub or gallop Extremities: extremities normal, atraumatic, no cyanosis or edema Pulses: 2+ and symmetric    Assessment:   1. 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 Essential tremor Would like to refer to neuro for eval & tx, pt hesitant Consider inderal to lower BP, help w/tremor & anxiety   3. Elevated blood pressure Need blood work, pt declines today-needlephobic, agrees to return within 4 weeks. Record 3 home pressures & bring at f/u OV  4. Anxiety state, unspecified Ativan, PRN recmd inderal  5. Needs tdap, pt declines.  6. Cyst on back Planning to see Dr Jon Gills next month

## 2014-05-06 ENCOUNTER — Telehealth: Payer: Self-pay | Admitting: Nurse Practitioner

## 2014-05-06 NOTE — Telephone Encounter (Signed)
Left detailed message for pt. Advised to call back if she has any questions.

## 2014-05-06 NOTE — Telephone Encounter (Signed)
pls call pt: Advise He may pick up his medical info that he brought to his last OV, at his convenience. It will be at front desk.

## 2014-05-29 ENCOUNTER — Ambulatory Visit (INDEPENDENT_AMBULATORY_CARE_PROVIDER_SITE_OTHER): Payer: BC Managed Care – PPO | Admitting: Nurse Practitioner

## 2014-05-29 ENCOUNTER — Encounter: Payer: Self-pay | Admitting: Nurse Practitioner

## 2014-05-29 VITALS — BP 162/95 | HR 64 | Temp 97.5°F | Resp 18 | Ht 73.0 in | Wt 233.0 lb

## 2014-05-29 DIAGNOSIS — M8949 Other hypertrophic osteoarthropathy, multiple sites: Secondary | ICD-10-CM

## 2014-05-29 DIAGNOSIS — R252 Cramp and spasm: Secondary | ICD-10-CM | POA: Insufficient documentation

## 2014-05-29 DIAGNOSIS — R7989 Other specified abnormal findings of blood chemistry: Secondary | ICD-10-CM

## 2014-05-29 DIAGNOSIS — M150 Primary generalized (osteo)arthritis: Secondary | ICD-10-CM

## 2014-05-29 DIAGNOSIS — G252 Other specified forms of tremor: Secondary | ICD-10-CM

## 2014-05-29 DIAGNOSIS — G250 Essential tremor: Secondary | ICD-10-CM

## 2014-05-29 DIAGNOSIS — Z0000 Encounter for general adult medical examination without abnormal findings: Secondary | ICD-10-CM | POA: Insufficient documentation

## 2014-05-29 DIAGNOSIS — M159 Polyosteoarthritis, unspecified: Secondary | ICD-10-CM

## 2014-05-29 DIAGNOSIS — I10 Essential (primary) hypertension: Secondary | ICD-10-CM

## 2014-05-29 LAB — COMPREHENSIVE METABOLIC PANEL
ALT: 29 U/L (ref 0–53)
AST: 29 U/L (ref 0–37)
Albumin: 4.2 g/dL (ref 3.5–5.2)
Alkaline Phosphatase: 73 U/L (ref 39–117)
BILIRUBIN TOTAL: 0.9 mg/dL (ref 0.2–1.2)
BUN: 22 mg/dL (ref 6–23)
CALCIUM: 9.2 mg/dL (ref 8.4–10.5)
CHLORIDE: 105 meq/L (ref 96–112)
CO2: 23 meq/L (ref 19–32)
CREATININE: 0.8 mg/dL (ref 0.4–1.5)
GFR: 97.64 mL/min (ref >60.00)
Glucose, Bld: 97 mg/dL (ref 70–99)
Potassium: 4.3 mEq/L (ref 3.5–5.1)
Sodium: 136 mEq/L (ref 135–145)
Total Protein: 6.9 g/dL (ref 6.0–8.3)

## 2014-05-29 LAB — CBC WITH DIFFERENTIAL/PLATELET
BASOS PCT: 0.4 % (ref 0.0–3.0)
Basophils Absolute: 0 10*3/uL (ref 0.0–0.1)
EOS ABS: 0.1 10*3/uL (ref 0.0–0.7)
EOS PCT: 1.7 % (ref 0.0–5.0)
HCT: 46.2 % (ref 39.0–52.0)
HEMOGLOBIN: 15.7 g/dL (ref 13.0–17.0)
LYMPHS PCT: 38.9 % (ref 12.0–46.0)
Lymphs Abs: 1.5 10*3/uL (ref 0.7–4.0)
MCHC: 34 g/dL (ref 30.0–36.0)
MCV: 95.7 fl (ref 78.0–100.0)
Monocytes Absolute: 0.4 10*3/uL (ref 0.1–1.0)
Monocytes Relative: 10.8 % (ref 3.0–12.0)
NEUTROS ABS: 1.9 10*3/uL (ref 1.4–7.7)
NEUTROS PCT: 48.2 % (ref 43.0–77.0)
Platelets: 199 10*3/uL (ref 150.0–400.0)
RBC: 4.82 Mil/uL (ref 4.22–5.81)
RDW: 12.3 % (ref 11.5–15.5)
WBC: 3.9 10*3/uL — AB (ref 4.0–10.5)

## 2014-05-29 LAB — LIPID PANEL
CHOL/HDL RATIO: 6
CHOLESTEROL: 175 mg/dL (ref 0–200)
HDL: 30.1 mg/dL — AB (ref >39.00)
NONHDL: 144.9
Triglycerides: 317 mg/dL — ABNORMAL HIGH (ref 0.0–149.0)
VLDL: 63.4 mg/dL — ABNORMAL HIGH (ref 0.0–40.0)

## 2014-05-29 LAB — VITAMIN B12: VITAMIN B 12: 372 pg/mL (ref 211–911)

## 2014-05-29 LAB — TSH: TSH: 1.9 u[IU]/mL (ref 0.35–4.50)

## 2014-05-29 LAB — T4, FREE: Free T4: 0.8 ng/dL (ref 0.60–1.60)

## 2014-05-29 LAB — VITAMIN D 25 HYDROXY (VIT D DEFICIENCY, FRACTURES): VITD: 38.59 ng/mL (ref 30.00–100.00)

## 2014-05-29 LAB — MAGNESIUM: Magnesium: 1.9 mg/dL (ref 1.5–2.5)

## 2014-05-29 LAB — HEMOGLOBIN A1C: Hgb A1c MFr Bld: 5.4 % (ref 4.6–6.5)

## 2014-05-29 MED ORDER — IBUPROFEN 600 MG PO TABS: 600.0000 mg | ORAL_TABLET | Freq: Two times a day (BID) | ORAL | Status: DC | PRN

## 2014-05-29 MED ORDER — PROPRANOLOL HCL ER 80 MG PO CP24: 80.0000 mg | ORAL_CAPSULE | Freq: Every day | ORAL | Status: DC

## 2014-05-29 MED ORDER — TRAMADOL HCL 50 MG PO TABS: 50.0000 mg | ORAL_TABLET | Freq: Every evening | ORAL | Status: DC | PRN

## 2014-05-29 NOTE — Assessment & Plan Note (Signed)
Mildly elevated BP, Home readings range from 130-160/90-95. Start propranolol as he it may help tremor as well. Advised may have some fatigue in 1st 2 weeks. Labs today. F/u 3-4 weeks.

## 2014-05-29 NOTE — Patient Instructions (Signed)
We will call with lab results.  Please start propranolol. Take after dinner or at bedtime.  Please return in 3 to 4 weeks to evaluate.   I decreased ibuprophen to 600 mg tabs. Continue to take with food as needed. Studies show tylenol & ibuprophen taken together can result in better pain relief than ibuprophen alone. Consider taking 500 to 1000 mg tylenol twice daily. Also, topical aspercreme may help if used 3 times daily. Natural anti-inflammatories can be used as well: Fresh ginger tea daily (grate 1-2 TBLS fresh ginger in & steep in hot water for 5 minutes); handful tart cherries or 1/2 cup tart cherry juice daily; 2-3 curmarin capsules daily. Start 1 supplement at a time & take for 2 weeks before adding a second or third, if needed.  Consider getting tetanus vaccine at physical with Dr Anitra Lauth or when you return for follow-up.  Nice to see you.

## 2014-05-29 NOTE — Progress Notes (Signed)
Subjective:     Henry Knight is a 63 y.o. male who presents for follow up osteoarthritis of bilat cmc joints, knees, shoulders; essential tremor, and elevated blood pressure. Also, he is returning to have labwork done-he is needle phobic & was not prepared to have labwork done at first visit. He is agreable today. Re osteoarthritis, he is getting good relief from ibuprophen 800 mg bid prn-works better than mobic. He thinks his blood pressure is a bit higher on medication. Re tremor, he has had persistent tremor in hands, legs, head & voice since childhood. He was evaluated by neurology in past. He tried propranolol with inadequate results & it made him feel tired, so he stopped taking medication. Re elevated bp, he brings in home readings that range from 135/90-160/95.  He is active-works as Art therapist. He plans to schedule physical w/Dr McGowen in next few mos, I will order labs work today pertinent to today's visit & for CPE. Pt not sure if he wants PSA checked. Will discuss further w/Dr McGowen.  The following portions of the patient's history were reviewed and updated as appropriate: allergies, current medications, past family history, past medical history, past social history, past surgical history and problem list.  Review of Systems Pertinent items are noted in HPI.    Objective:    BP 162/95  Pulse 64  Temp(Src) 97.5 F (36.4 C) (Oral)  Resp 18  Ht 6\' 1"  (1.854 m)  Wt 233 lb (105.688 kg)  BMI 30.75 kg/m2  SpO2 96% BP 162/95  Pulse 64  Temp(Src) 97.5 F (36.4 C) (Oral)  Resp 18  Ht 6\' 1"  (1.854 m)  Wt 233 lb (105.688 kg)  BMI 30.75 kg/m2  SpO2 96% General appearance: alert, cooperative, appears stated age, no distress and mild voice tremor, comes & goes. Head: Normocephalic, without obvious abnormality, atraumatic Eyes: negative findings: lids and lashes normal, conjunctivae and sclerae normal, corneas clear and pupils equal, round, reactive to light and  accomodation Lungs: clear to auscultation bilaterally Heart: regular rate and rhythm, S1, S2 normal, no murmur, click, rub or gallop Extremities: extremities normal, atraumatic, no cyanosis or edema and varicose veins noted Pulses: 2+ and symmetric Neurologic: Motor: grossly normal Gait: Normal    Assessment:   1. Essential tremor - Vitamin B12 - TSH - T4, free - Vitamin B1 - propranolol ER (INDERAL LA) 80 MG 24 hr capsule; Take 1 capsule (80 mg total) by mouth daily.  Dispense: 30 capsule; Refill: 1  2. Preventative health care - CBC with Differential - Hemoglobin A1c - Hepatitis C antibody - Lipid panel - Comprehensive metabolic panel - Vit D  25 hydroxy (rtn osteoporosis monitoring) - Vitamin B12 - Vitamin B1  3. Cramp of both lower extremities - Vitamin B12 - TSH - T4, free - Vitamin B1 - Magnesium  4. Essential hypertension, benign - propranolol ER (INDERAL LA) 80 MG 24 hr capsule; Take 1 capsule (80 mg total) by mouth daily.  Dispense: 30 capsule; Refill: 1  5. Primary osteoarthritis involving multiple joints - traMADol (ULTRAM) 50 MG tablet; Take 1 tablet (50 mg total) by mouth at bedtime as needed.  Dispense: 30 tablet; Refill: 2 - ibuprofen (ADVIL,MOTRIN) 600 MG tablet; Take 1 tablet (600 mg total) by mouth 2 (two) times daily as needed.  Dispense: 60 tablet; Refill: 1  See problem list for complete A&P See pt instructions. F/u 3-4 weeks. Needs tdap-declined today. Will request Gi records from Dr Fawn Kirk

## 2014-05-29 NOTE — Assessment & Plan Note (Signed)
Pos fam Hx: mother, mat aunts. Tremor present since childhood, hands, legs, voice, head involved. He was evaluated by neuro many years ago.  Manages socially w/ETOH. Tried propranolol in past-didn't think it helped. Has gotten better over years-thinks due to fewer triggers-lifestyle changes/stability. Labs today to eval thyroid, mg, B12 & B1, CMET. Pt agreeable to starting propranolol again since Bp is slightly elevated & med may work for both conditions. F/u 3-4 wks.

## 2014-05-29 NOTE — Progress Notes (Signed)
Pre visit review using our clinic review tool, if applicable. No additional management support is needed unless otherwise documented below in the visit note. 

## 2014-05-29 NOTE — Assessment & Plan Note (Signed)
800 mg ibuprophen bid is helpful, has bumped BP a little. Will decrease to 600 mg bid PRN.  Encourage topicals-aspercream, natural anti-inflamms. May add tylenol as well.

## 2014-05-29 NOTE — Assessment & Plan Note (Signed)
Nocturnal cramping in thighs for several years. Pt is a landscaper. Labs to eval lytes, Mg, thyroid. Muscle relaxer helpful in past.

## 2014-05-30 LAB — HEPATITIS C ANTIBODY: HCV Ab: NEGATIVE

## 2014-05-30 LAB — LDL CHOLESTEROL, DIRECT: Direct LDL: 84.3 mg/dL

## 2014-05-31 ENCOUNTER — Telehealth: Payer: Self-pay | Admitting: Nurse Practitioner

## 2014-05-31 NOTE — Telephone Encounter (Signed)
pls call pt: Advise Labs look good, except B12 borderline: starts B complex to get 1200 mcg daily; Triglycerides are elevated. This is influenced by sugar intake. I recommend he cut out sweet foods & beverages, including alcoholic beverages for 4 to 6 weeks & we can check again. If diet changes do not drop level, medication is option. Magnesium is borderline. Start 250 mg at bedtime. If it causes diarrhea, cut back to 125 mg.

## 2014-05-31 NOTE — Telephone Encounter (Signed)
Left message for pt to call back  °

## 2014-06-02 LAB — VITAMIN B1: Vitamin B1 (Thiamine): 11 nmol/L (ref 8–30)

## 2014-06-03 NOTE — Telephone Encounter (Signed)
Spoke with pt, advised lab results. Pt understood and will take vitamins as instructed.

## 2014-06-03 NOTE — Telephone Encounter (Signed)
Left message for pt to call back  °

## 2014-06-26 ENCOUNTER — Encounter: Payer: Self-pay | Admitting: Nurse Practitioner

## 2014-06-26 ENCOUNTER — Ambulatory Visit (INDEPENDENT_AMBULATORY_CARE_PROVIDER_SITE_OTHER): Payer: BC Managed Care – PPO | Admitting: Nurse Practitioner

## 2014-06-26 VITALS — BP 149/81 | HR 44 | Temp 97.5°F | Resp 16 | Ht 72.0 in | Wt 233.0 lb

## 2014-06-26 DIAGNOSIS — Z23 Encounter for immunization: Secondary | ICD-10-CM

## 2014-06-26 DIAGNOSIS — G252 Other specified forms of tremor: Secondary | ICD-10-CM

## 2014-06-26 DIAGNOSIS — I10 Essential (primary) hypertension: Secondary | ICD-10-CM

## 2014-06-26 DIAGNOSIS — G250 Essential tremor: Secondary | ICD-10-CM

## 2014-06-26 NOTE — Patient Instructions (Signed)
Magnesium is low normal-may be contributing to muscle cramps. Take 250 mg magnesium maleate at night. If it causes diarrhea, cut in half.  B12 & B1 are low normal. Regular alcohol intake can make these low. Start B complex daily to get 1200 mcg B12 in each tablet or dropperful. Liquid is absorbed best.  Take vitamin D daily 1000 iu D3 with meal.  Triglycerides are high. Refined sugars (including alcoholic beverages) & flours contribute to this high number. Triglycerides turn into bad cholesterol that contributes to heart & vessel disease. Diet changes can normalize this number. Overall, you are in good shape, given age, activity, & no prior heart history.  See handout for good human nutrition.  Continue propranolol. See Dr Anitra Lauth for physical & flu shot.  Nice to see you.

## 2014-06-26 NOTE — Progress Notes (Signed)
Pre visit review using our clinic review tool, if applicable. No additional management support is needed unless otherwise documented below in the visit note. 

## 2014-06-27 NOTE — Assessment & Plan Note (Signed)
Started propranolol. Pt not sure if helping. Will continue for BP control.

## 2014-06-27 NOTE — Progress Notes (Signed)
Subjective:     Henry Knight is a 63 y.o. male presents for f/u elevated BP & essential tremor. I started propranolol to address both conditions. BP is well controlled in ofc today & home readings have been under 140/90. He is not sure if tremor is better. He has had some fatigue since starting med. He wishes to continue for another 4 weeks. We discussed elevated triglycerides. He does not want to start a statin, but would like to make some diet changes-cutting back sugar & refined flours. He is active & no heart history. Will not push meds. He feels well overall, but continues to c/o L knee pain. He sees ortho-knee replcmt has been recommended. He plans to schedule CPE w/Dr McGowen in next month or so.  The following portions of the patient's history were reviewed and updated as appropriate: allergies, current medications, past family history, past medical history, past social history, past surgical history and problem list.  Review of Systems Pertinent items are noted in HPI.    Objective:    BP 149/81  Pulse 44  Temp(Src) 97.5 F (36.4 C) (Oral)  Resp 16  Ht 6' (1.829 m)  Wt 233 lb (105.688 kg)  BMI 31.59 kg/m2  SpO2 99% BP 149/81  Pulse 44  Temp(Src) 97.5 F (36.4 C) (Oral)  Resp 16  Ht 6' (1.829 m)  Wt 233 lb (105.688 kg)  BMI 31.59 kg/m2  SpO2 99% General appearance: alert, cooperative, appears stated age and no distress Head: Normocephalic, without obvious abnormality, atraumatic Eyes: negative findings: lids and lashes normal and conjunctivae and sclerae normal    Assessment:  1. Essential tremor  2. Essential hypertension, benign  3. Need for prophylactic vaccination with combined diphtheria-tetanus-pertussis (DTP) vaccine - Tdap vaccine greater than or equal to 7yo IM  See problem list for complete A&P See pt instructions. F/u at convenience for CPE w/Dr Ware.

## 2014-06-27 NOTE — Assessment & Plan Note (Signed)
Normal BP with propranolol. Pt reports some fatigue, tolerable. Will continue.

## 2014-07-22 ENCOUNTER — Other Ambulatory Visit: Payer: Self-pay | Admitting: *Deleted

## 2014-07-22 DIAGNOSIS — G250 Essential tremor: Secondary | ICD-10-CM

## 2014-07-22 DIAGNOSIS — I10 Essential (primary) hypertension: Secondary | ICD-10-CM

## 2014-07-22 MED ORDER — PROPRANOLOL HCL ER 80 MG PO CP24: 80.0000 mg | ORAL_CAPSULE | Freq: Every day | ORAL | Status: DC

## 2014-08-07 ENCOUNTER — Encounter: Payer: Self-pay | Admitting: Family Medicine

## 2014-08-07 ENCOUNTER — Ambulatory Visit (INDEPENDENT_AMBULATORY_CARE_PROVIDER_SITE_OTHER): Payer: BC Managed Care – PPO | Admitting: Family Medicine

## 2014-08-07 VITALS — BP 131/83 | HR 45 | Temp 97.7°F | Resp 18 | Ht 72.0 in | Wt 233.0 lb

## 2014-08-07 DIAGNOSIS — Z23 Encounter for immunization: Secondary | ICD-10-CM

## 2014-08-07 DIAGNOSIS — Z125 Encounter for screening for malignant neoplasm of prostate: Secondary | ICD-10-CM

## 2014-08-07 DIAGNOSIS — H7113 Cholesteatoma of tympanum, bilateral: Secondary | ICD-10-CM

## 2014-08-07 DIAGNOSIS — Z0000 Encounter for general adult medical examination without abnormal findings: Secondary | ICD-10-CM

## 2014-08-07 NOTE — Progress Notes (Addendum)
Office Note 08/07/2014  CC:  Chief Complaint  Patient presents with  . Annual Exam   HPI:  Henry Knight is a 63 y.o. White male who is here for annual CPE. No acute complaints. Compliant with meds, propranolol seems to be helping more with bp than with essential tremor but he is doing "ok" on this for now.  Past Medical History  Diagnosis Date  . Arthritis   . Neuromuscular disorder   . Essential tremor   . Chicken pox   . Melanoma     chest  . BCC (basal cell carcinoma of skin)   . HTN (hypertension)     Past Surgical History  Procedure Laterality Date  . Cosmetic surgery    . Knee surgery Right 2012  . Joint replacement Right 2012  . Shoulder arthroscopy w/ rotator cuff repair  2004  . Colonoscopy w/ polypectomy  approx 2007    Dr. Marcy Panning 5 yrs but pt did not go    Family History  Problem Relation Age of Onset  . Heart disease Father   . Alcohol abuse Father   . Obesity Brother   . Asperger's syndrome Brother   . Obesity Mother   . Tremor Mother   . Anxiety disorder Mother     History   Social History  . Marital Status: Married    Spouse Name: N/A    Number of Children: 1  . Years of Education: N/A   Occupational History  . Not on file.   Social History Main Topics  . Smoking status: Never Smoker   . Smokeless tobacco: Never Used  . Alcohol Use: 4.8 oz/week    8 Glasses of wine per week  . Drug Use: No  . Sexual Activity: Yes   Other Topics Concern  . 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 He lives with his wife & daughter.    Outpatient Prescriptions Prior to Visit  Medication Sig Dispense Refill  . ibuprofen (ADVIL,MOTRIN) 600 MG tablet Take 1 tablet (600 mg total) by mouth 2 (two) times daily as needed. 60 tablet 1  . LORazepam (ATIVAN) 1 MG tablet Take 1 tablet (1 mg total) by mouth daily as needed for anxiety. 60 tablet 1  . propranolol ER (INDERAL LA) 80 MG 24 hr capsule  Take 1 capsule (80 mg total) by mouth daily. 30 capsule 2  . traMADol (ULTRAM) 50 MG tablet Take 1 tablet (50 mg total) by mouth at bedtime as needed. 30 tablet 2   No facility-administered medications prior to visit.    Allergies  Allergen Reactions  . Penicillins     ROS Review of Systems  Constitutional: Negative for fever, chills, appetite change and fatigue.  HENT: Negative for congestion, dental problem, ear pain and sore throat.   Eyes: Negative for discharge, redness and visual disturbance.  Respiratory: Negative for cough, chest tightness, shortness of breath and wheezing.   Cardiovascular: Negative for chest pain, palpitations and leg swelling.  Gastrointestinal: Negative for nausea, vomiting, abdominal pain, diarrhea and blood in stool.  Genitourinary: Negative for dysuria, urgency, frequency, hematuria, flank pain and difficulty urinating.  Musculoskeletal: Negative for myalgias, back pain, joint swelling, arthralgias and neck stiffness.  Skin: Negative for pallor and rash.  Neurological: Negative for dizziness, speech difficulty, weakness and headaches.  Hematological: Negative for adenopathy. Does not bruise/bleed easily.  Psychiatric/Behavioral: Negative for confusion and sleep disturbance. The patient is not nervous/anxious.  PE; Blood pressure 131/83, pulse 45, temperature 97.7 F (36.5 C), temperature source Oral, resp. rate 18, height 6' (1.829 m), weight 233 lb (105.688 kg), SpO2 95 %. Gen: Alert, well appearing.  Patient is oriented to person, place, time, and situation. AFFECT: pleasant, lucid thought and speech. ENT: Ears: EACs clear, normal epithelium.  TMs with good light reflex and landmarks bilaterally.  At the area where the [facility] intersects the TM he has very distinct, round, pearly-colored cystic-appearing nodular masses--several on right TM, one on left TM.  Eyes: no injection, icteris, swelling, or exudate.  EOMI, PERRLA. Nose: no drainage or  turbinate edema/swelling.  No injection or focal lesion.  Mouth: lips without lesion/swelling.  Oral mucosa pink and moist.  Dentition intact and without obvious caries or gingival swelling.  Oropharynx without erythema, exudate, or swelling.  Neck: supple/nontender.  No LAD, mass, or TM.  Carotid pulses 2+ bilaterally, without bruits. CV: RRR, no m/r/g.   LUNGS: CTA bilat, nonlabored resps, good aeration in all lung fields. ABD: soft, NT, ND, BS normal.  No hepatospenomegaly or mass.  No bruits. EXT: no clubbing, cyanosis, or edema.  Musculoskeletal: no joint swelling, erythema, warmth, or tenderness.  ROM of all joints intact. Skin - no sores or suspicious lesions or rashes or color changes Rectal exam: negative without mass, lesions or tenderness, PROSTATE EXAM: smooth and symmetric without nodules or tenderness. Neuro: CN 2-12 intact bilaterally, strength 5/5 in proximal and distal upper extremities and lower extremities bilaterally.  No sensory deficits.  Mild resting tremor of arms, head, neck..  No disdiadochokinesis.  No ataxia.    Pertinent labs:  Lab Results  Component Value Date   WBC 3.9* 05/29/2014   HGB 15.7 05/29/2014   HCT 46.2 05/29/2014   MCV 95.7 05/29/2014   PLT 199.0 05/29/2014     Chemistry      Component Value Date/Time   NA 136 05/29/2014 1000   K 4.3 05/29/2014 1000   CL 105 05/29/2014 1000   CO2 23 05/29/2014 1000   BUN 22 05/29/2014 1000   CREATININE 0.8 05/29/2014 1000      Component Value Date/Time   CALCIUM 9.2 05/29/2014 1000   ALKPHOS 73 05/29/2014 1000   AST 29 05/29/2014 1000   ALT 29 05/29/2014 1000   BILITOT 0.9 05/29/2014 1000     Lab Results  Component Value Date   CHOL 175 05/29/2014   HDL 30.10* 05/29/2014   LDLDIRECT 84.3 05/29/2014   TRIG 317.0* 05/29/2014   CHOLHDL 6 05/29/2014   Lab Results  Component Value Date   TSH 1.90 05/29/2014    ASSESSMENT AND PLAN:   Health maintenance examination Reviewed age and gender  appropriate health maintenance issues (prudent diet, regular exercise, health risks of tobacco and excessive alcohol, use of seatbelts, fire alarms in home, use of sunscreen).  Also reviewed age and gender appropriate health screening as well as vaccine recommendations. Flu vaccine IM today. Recent CBC, lipids, CMET, TSH 05/2014: reviewed again today.  Just needs to work on diet/exercise for mildly elevated trigs and low HDL. DRE normal today, PSA drawn. Gave pt Dr. Ulyses Amor number to call and arrange f/u colonoscopy that he is overdue for. We called their office today and asked them to sent report of last colonoscopy. Regarding his abnormal small masses around the TM edge on both TM's, I don't know what these are so I've referred him to ENT for further e/m of this.   An After Visit Summary was printed  and given to the patient.  FOLLOW UP:  Return for 4-6 mo for routine chronic illness f/u.

## 2014-08-07 NOTE — Addendum Note (Signed)
Addended by: Jannette Spanner on: 08/07/2014 11:20 AM   Modules accepted: Orders

## 2014-08-07 NOTE — Patient Instructions (Signed)
Call [facility] (Dr. Ulyses Amor office) at [phone] to arrange your colonoscopy that is overdue.

## 2014-08-07 NOTE — Assessment & Plan Note (Signed)
Reviewed age and gender appropriate health maintenance issues (prudent diet, regular exercise, health risks of tobacco and excessive alcohol, use of seatbelts, fire alarms in home, use of sunscreen).  Also reviewed age and gender appropriate health screening as well as vaccine recommendations. Flu vaccine IM today. Recent CBC, lipids, CMET, TSH 05/2014: reviewed again today.  Just needs to work on diet/exercise for mildly elevated trigs and low HDL. DRE normal today, PSA drawn. Gave pt Dr. Ulyses Amor number to call and arrange f/u colonoscopy that he is overdue for. We called their office today and asked them to sent report of last colonoscopy. Regarding his abnormal small masses around the TM edge on both TM's, I don't know what these are so I've referred him to ENT for further e/m of this.

## 2014-08-07 NOTE — Progress Notes (Signed)
Pre visit review using our clinic review tool, if applicable. No additional management support is needed unless otherwise documented below in the visit note. 

## 2014-08-08 LAB — PSA: PSA: 0.61 ng/mL (ref 0.10–4.00)

## 2014-10-08 ENCOUNTER — Other Ambulatory Visit: Payer: Self-pay

## 2014-10-08 ENCOUNTER — Telehealth: Payer: Self-pay | Admitting: Nurse Practitioner

## 2014-10-08 DIAGNOSIS — M150 Primary generalized (osteo)arthritis: Principal | ICD-10-CM

## 2014-10-08 DIAGNOSIS — M159 Polyosteoarthritis, unspecified: Secondary | ICD-10-CM

## 2014-10-08 DIAGNOSIS — M8949 Other hypertrophic osteoarthropathy, multiple sites: Secondary | ICD-10-CM

## 2014-10-08 NOTE — Telephone Encounter (Signed)
Please advise on refill.

## 2014-10-08 NOTE — Telephone Encounter (Signed)
Patient requesting refill for Lorazepam & Ibuprofen [facility] [address]

## 2014-10-08 NOTE — Telephone Encounter (Signed)
Please send these to me as refills. Thnx!

## 2014-10-08 NOTE — Telephone Encounter (Signed)
Last OV 08/07/2014

## 2014-10-09 MED ORDER — IBUPROFEN 600 MG PO TABS: 600.0000 mg | ORAL_TABLET | Freq: Two times a day (BID) | ORAL | Status: DC | PRN

## 2014-10-09 MED ORDER — LORAZEPAM 1 MG PO TABS: 1.0000 mg | ORAL_TABLET | Freq: Every day | ORAL | Status: DC | PRN

## 2014-10-17 ENCOUNTER — Other Ambulatory Visit: Payer: Self-pay | Admitting: *Deleted

## 2014-10-17 DIAGNOSIS — I10 Essential (primary) hypertension: Secondary | ICD-10-CM

## 2014-10-17 DIAGNOSIS — G250 Essential tremor: Secondary | ICD-10-CM

## 2014-10-17 MED ORDER — PROPRANOLOL HCL ER 80 MG PO CP24: 80.0000 mg | ORAL_CAPSULE | Freq: Every day | ORAL | Status: DC

## 2014-11-04 DIAGNOSIS — K5790 Diverticulosis of intestine, part unspecified, without perforation or abscess without bleeding: Secondary | ICD-10-CM

## 2014-11-04 HISTORY — DX: Diverticulosis of intestine, part unspecified, without perforation or abscess without bleeding: K57.90

## 2014-11-14 LAB — HM COLONOSCOPY

## 2014-11-18 ENCOUNTER — Encounter: Payer: Self-pay | Admitting: Family Medicine

## 2014-12-12 ENCOUNTER — Encounter: Payer: Self-pay | Admitting: Family Medicine

## 2015-03-07 ENCOUNTER — Encounter: Payer: Self-pay | Admitting: Nurse Practitioner

## 2015-03-07 ENCOUNTER — Ambulatory Visit (INDEPENDENT_AMBULATORY_CARE_PROVIDER_SITE_OTHER): Payer: Medicare Other | Admitting: Nurse Practitioner

## 2015-03-07 VITALS — BP 122/64 | HR 71 | Temp 97.5°F | Ht 72.0 in | Wt 233.0 lb

## 2015-03-07 DIAGNOSIS — R252 Cramp and spasm: Secondary | ICD-10-CM | POA: Diagnosis not present

## 2015-03-07 DIAGNOSIS — E781 Pure hyperglyceridemia: Secondary | ICD-10-CM

## 2015-03-07 DIAGNOSIS — M150 Primary generalized (osteo)arthritis: Secondary | ICD-10-CM

## 2015-03-07 DIAGNOSIS — M8949 Other hypertrophic osteoarthropathy, multiple sites: Secondary | ICD-10-CM

## 2015-03-07 DIAGNOSIS — F4010 Social phobia, unspecified: Secondary | ICD-10-CM

## 2015-03-07 DIAGNOSIS — IMO0001 Reserved for inherently not codable concepts without codable children: Secondary | ICD-10-CM

## 2015-03-07 DIAGNOSIS — H7393 Unspecified disorder of tympanic membrane, bilateral: Secondary | ICD-10-CM

## 2015-03-07 DIAGNOSIS — M159 Polyosteoarthritis, unspecified: Secondary | ICD-10-CM

## 2015-03-07 DIAGNOSIS — R030 Elevated blood-pressure reading, without diagnosis of hypertension: Secondary | ICD-10-CM | POA: Diagnosis not present

## 2015-03-07 MED ORDER — LORAZEPAM 1 MG PO TABS: 1.0000 mg | ORAL_TABLET | Freq: Every day | ORAL | Status: DC | PRN

## 2015-03-07 MED ORDER — TRAMADOL HCL 50 MG PO TABS: 50.0000 mg | ORAL_TABLET | Freq: Every evening | ORAL | Status: DC | PRN

## 2015-03-07 MED ORDER — IBUPROFEN 600 MG PO TABS: 600.0000 mg | ORAL_TABLET | Freq: Two times a day (BID) | ORAL | Status: DC | PRN

## 2015-03-07 NOTE — Patient Instructions (Signed)
Start curmarin 2000 mg qd for arthritis pain. Continue ibuprophen as needed. OK to add tylenol 1000 mg 3 times daily if needed.  Consider starting a low dose anti-depressant for treatment of anxiety so that you do not need lorazepam as often.  Take 250 mg magnesium daily, at bedtime.  Fiber goal is 30 mg daily to lower triglycerides. Take spoonful metamucil with 8 oz water daily to lower triglycerides. Limit alcohol intake to 10 oz wine daily to lower triglycerides.  Take B complex to get 1200 mcg b12 daily.  When buying supplements, look for USP label.  Pls return for labs.  See you in 6 mos or sooner if concerns.

## 2015-03-07 NOTE — Progress Notes (Signed)
Pre visit review using our clinic review tool, if applicable. No additional management support is needed unless otherwise documented below in the visit note. 

## 2015-03-10 ENCOUNTER — Telehealth: Payer: Self-pay | Admitting: Nurse Practitioner

## 2015-03-10 ENCOUNTER — Other Ambulatory Visit: Payer: Self-pay | Admitting: Family Medicine

## 2015-03-10 DIAGNOSIS — R030 Elevated blood-pressure reading, without diagnosis of hypertension: Secondary | ICD-10-CM

## 2015-03-10 DIAGNOSIS — E781 Pure hyperglyceridemia: Secondary | ICD-10-CM | POA: Insufficient documentation

## 2015-03-10 DIAGNOSIS — H7390 Unspecified disorder of tympanic membrane, unspecified ear: Secondary | ICD-10-CM | POA: Insufficient documentation

## 2015-03-10 DIAGNOSIS — IMO0001 Reserved for inherently not codable concepts without codable children: Secondary | ICD-10-CM | POA: Insufficient documentation

## 2015-03-10 NOTE — Telephone Encounter (Signed)
LMOM for pt to CB and advise which ENT he prefers.

## 2015-03-10 NOTE — Progress Notes (Signed)
Subjective:     Henry Knight is a 64 y.o. male presents for f/u of hypertriglyceridemia, anxiety, continues to c/o leg cramps, BP is elevated today, continues to c/o joint pain. Hypertriglyceridemia: over 300. Discussed potential contributing dietary factors-refined sugar including ETOH, refined grains. Pt states he does not eat a lot of refined sugar or grains, but drinks wine daily. Discussed cutting back to no more than 2 glasses daily-dry wine & increasing fiber in diet. He is not interested in taking statin or fenofibrate. Anxiety: mostly social anxiety & is r/t tremor (hands & head), goes back to childhood-he was on valium as child & teen for tremor. Has been using ativan for 15 yrs w/relief in anxiety. Also uses ETOH for social anxiety. Discussed benefits of SSRI for anxiety. Discussed potential harms of LT use of benzos. He does not want to change meds. Was taking propranolol for tremor, anxiety, & BP. Stopped as he does not like to take meds & didn't think it was helping tremor or anixiety.  continues to c/o leg cramps: Mg was 1.9-low nml. He is not taking supplement as recommended. 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  BP is elevated today: recheck was nml. Pt states home bp are good, but BP always elevates in social situations. Discussed indication for lowering BP if stays elevated most of the time. He will consider re-starting propranolol.  continues to c/o joint pain: L knee, L heel, bilat shoulders, bilat CMC joints. He is taking advil 600 mg bid & uses ativan to "help deal with pain". We have discussed celebrex-he does not want to start, feels ibuprophen working OK. Denies heartburn/stomach upset, black stools. Discussed adding curmarin 2000 mg qd.   The following portions of the patient's history were reviewed and updated as appropriate: allergies, current medications, past medical history, past social history, past surgical history and problem list.  Review of  Systems Pertinent items are noted in HPI.    Objective:    BP 122/64 mmHg  Pulse 71  Temp(Src) 97.5 F (36.4 C) (Oral)  Ht 6' (1.829 m)  Wt 233 lb (105.688 kg)  BMI 31.59 kg/m2  SpO2 98% BP 122/64 mmHg  Pulse 71  Temp(Src) 97.5 F (36.4 C) (Oral)  Ht 6' (1.829 m)  Wt 233 lb (105.688 kg)  BMI 31.59 kg/m2  SpO2 98% General appearance: alert, cooperative, appears stated age and no distress Head: Normocephalic, without obvious abnormality, atraumatic Eyes: negative findings: lids and lashes normal and conjunctivae and sclerae normal  Ears: R TM has 2 pearly growths top of TM, canal clear. L TM Flesh-colored growth lateral TM, irreg shape, canal nml Lungs: clear to auscultation bilaterally Heart: regular rate and rhythm, S1, S2 normal, no murmur, click, rub or gallop Extremities: extremities normal, atraumatic, no cyanosis or edema and no red or swollen joints. Well-healed Scar anterior R knee from replcmt. Neurologic: Alert and oriented X 3, normal strength and tone. Normal symmetric reflexes. Normal coordination and gait Motor: fine hand tremor, bilat    Assessment:Plan     1. Primary osteoarthritis involving multiple joints continue - ibuprofen (ADVIL,MOTRIN) 600 MG tablet; Take 1 tablet (600 mg total) by mouth 2 (two) times daily as needed.  Dispense: 60 tablet; Refill: 3 - traMADol (ULTRAM) 50 MG tablet; Take 1 tablet (50 mg total) by mouth at bedtime as needed.  Dispense: 30 tablet; Refill: 2 Add curmarin 2000 mg qd  2. Hypertriglyceridemia Add 1 tbls metamucil qd mixed w/8 oz water Cut back ETOH  conumption-no more than 2 glasses wine qd  3. Cramp of both lower extremities Start magnesium 250 mg qhs  4. Elevated blood pressure Consider re-startin propranolol Need to repeat labs in August  5. Abnormal tympanic membrane, bilateral Denies hearing changes Refer to ENT (pt will CB w/name of provider he prefers)  6. Social anxiety disorder recmd SSRI to use ativan  less Pt refuses Cont ativan PRN  F/u 2 mos-cmet, urine micro, BP Spent 25 Minutes with patient, 50% or more was spent in counseling.

## 2015-03-10 NOTE — Telephone Encounter (Signed)
pls call pt & ask who he wants to see for ENT. Thnx!

## 2015-03-10 NOTE — Telephone Encounter (Signed)
Refill request for tramadol.  Pt was just given this medication at last OV on 03/07/15 x 2 rfs.  Rx denied.

## 2015-03-11 ENCOUNTER — Other Ambulatory Visit: Payer: Self-pay | Admitting: Nurse Practitioner

## 2015-03-11 DIAGNOSIS — H7393 Unspecified disorder of tympanic membrane, bilateral: Secondary | ICD-10-CM

## 2015-03-11 NOTE — Telephone Encounter (Signed)
Pt LMOM stating that her prefers [facility] [phone] and he won't be able to schedule until July.

## 2015-07-11 ENCOUNTER — Ambulatory Visit (INDEPENDENT_AMBULATORY_CARE_PROVIDER_SITE_OTHER): Payer: Medicare Other | Admitting: Family Medicine

## 2015-07-11 ENCOUNTER — Encounter: Payer: Self-pay | Admitting: Family Medicine

## 2015-07-11 VITALS — BP 153/89 | HR 64 | Temp 98.0°F | Resp 16 | Wt 236.0 lb

## 2015-07-11 DIAGNOSIS — M25571 Pain in right ankle and joints of right foot: Secondary | ICD-10-CM

## 2015-07-11 DIAGNOSIS — M79674 Pain in right toe(s): Secondary | ICD-10-CM

## 2015-07-11 DIAGNOSIS — Z23 Encounter for immunization: Secondary | ICD-10-CM

## 2015-07-11 NOTE — Progress Notes (Signed)
OFFICE VISIT  07/11/2015   CC:  Chief Complaint  Patient presents with  . Toe Pain    2 digit right foot x 3-4 weeks     HPI:    Patient is a 64 y.o. Caucasian male who presents for pain in 2nd toe of R foot. Onset about a month ago, constantly there but waxes and wanes some in intensity, located on the underside of 2nd toe and in space between 2nd and 3rd toes a bit as well,no injury preceding it.  No worsening as time goes on. Never turned red or swelled up.  Works on Retail banker a lot Runner, broadcasting/film/video.  Wearing sandals seems to help decrease the pressure on the toe.  Past Medical History  Diagnosis Date  . Arthritis   . Neuromuscular disorder ([place])   . Essential tremor   . Melanoma ([place])     chest  . BCC (basal cell carcinoma of skin)   . HTN (hypertension)   . History of adenomatous polyp of colon 2007; 2016    Recall 5 yrs (Dr. Benson Norway)  . Diverticulosis 11/2014    Noted on 11/2014 colonoscopy: ascending, descending, and sigmoid    Past Surgical History  Procedure Laterality Date  . Cosmetic surgery    . Knee surgery Right 2012  . Joint replacement Right 2012  . Shoulder arthroscopy w/ rotator cuff repair  2004  . Colonoscopy w/ polypectomy  approx 2007; 11/2014    Dr. Marcy Panning 5 yrs     Outpatient Prescriptions Prior to Visit  Medication Sig Dispense Refill  . ibuprofen (ADVIL,MOTRIN) 600 MG tablet Take 1 tablet (600 mg total) by mouth 2 (two) times daily as needed. 60 tablet 3  . LORazepam (ATIVAN) 1 MG tablet Take 1 tablet (1 mg total) by mouth daily as needed for anxiety. 30 tablet 2  . traMADol (ULTRAM) 50 MG tablet Take 1 tablet (50 mg total) by mouth at bedtime as needed. 30 tablet 2  . propranolol ER (INDERAL LA) 80 MG 24 hr capsule Take 1 capsule (80 mg total) by mouth daily. (Patient not taking: Reported on 03/07/2015) 30 capsule 2   No facility-administered medications prior to visit.    Allergies  Allergen Reactions  . Penicillins     ROS As per  HPI  PE: Blood pressure 153/89, pulse 64, temperature 98 F (36.7 C), temperature source Oral, resp. rate 16, weight 236 lb (107.049 kg), SpO2 94 %. Gen: Alert, well appearing.  Patient is oriented to person, place, time, and situation. R foot: plantar surface of 2nd toe MTP joint is TTP, not so much tenderness noted in interdigital spaces.  No metatarsal head tenderness.  No plantar wart present.  No swelling or warmth or erythema of any joints of toe or foot.  ROM of toes intact.  LABS:  none  IMPRESSION AND PLAN:  Toe arthralgia in R foot, likely osteoarthritis. Rest, NSAIDs discussed.  Discussed getting an x-ray today but pt says he sees Dr. Percell Miller, orthopedist, and will likely f/u with him about his toe pain and likely get an x-ray at that time, so we decided to defer x-ray today. No new meds recommended.    He went on to asks some questions today about fish oil and hypertriglyceridemia treatment but I had to ask him to bring this up at his next chronic illness f/u or CPE.  An After Visit Summary was printed and given to the patient.  FOLLOW UP: Return if symptoms worsen or fail to  improve.

## 2015-07-11 NOTE — Progress Notes (Signed)
Pre visit review using our clinic review tool, if applicable. No additional management support is needed unless otherwise documented below in the visit note. 

## 2015-07-11 NOTE — Addendum Note (Signed)
Addended by: Ralph Dowdy on: 07/11/2015 10:39 AM   Modules accepted: Orders

## 2015-08-05 ENCOUNTER — Other Ambulatory Visit: Payer: Self-pay | Admitting: *Deleted

## 2015-08-05 NOTE — Telephone Encounter (Signed)
RF request for lorazepam LOV: 08/07/14 Next ov:  None Last written: 03/07/15 #30 w/ 2RF  Please advise. Thanks. This was a Henry Knight pt, last few ov have been with Dr. Anitra Lauth.

## 2015-08-06 MED ORDER — LORAZEPAM 1 MG PO TABS: 1.0000 mg | ORAL_TABLET | Freq: Every day | ORAL | Status: DC | PRN

## 2015-08-07 NOTE — Telephone Encounter (Signed)
Rx faxed

## 2015-10-10 ENCOUNTER — Ambulatory Visit (INDEPENDENT_AMBULATORY_CARE_PROVIDER_SITE_OTHER): Payer: Medicare Other | Admitting: Family Medicine

## 2015-10-10 ENCOUNTER — Encounter: Payer: Self-pay | Admitting: Family Medicine

## 2015-10-10 VITALS — BP 141/106 | HR 68 | Temp 98.2°F | Resp 20 | Wt 239.8 lb

## 2015-10-10 DIAGNOSIS — M629 Disorder of muscle, unspecified: Secondary | ICD-10-CM | POA: Diagnosis not present

## 2015-10-10 DIAGNOSIS — R252 Cramp and spasm: Secondary | ICD-10-CM | POA: Diagnosis not present

## 2015-10-10 DIAGNOSIS — M9905 Segmental and somatic dysfunction of pelvic region: Secondary | ICD-10-CM | POA: Diagnosis not present

## 2015-10-10 DIAGNOSIS — M150 Primary generalized (osteo)arthritis: Secondary | ICD-10-CM | POA: Diagnosis not present

## 2015-10-10 DIAGNOSIS — M8949 Other hypertrophic osteoarthropathy, multiple sites: Secondary | ICD-10-CM

## 2015-10-10 DIAGNOSIS — M159 Polyosteoarthritis, unspecified: Secondary | ICD-10-CM

## 2015-10-10 DIAGNOSIS — M6289 Other specified disorders of muscle: Secondary | ICD-10-CM | POA: Insufficient documentation

## 2015-10-10 DIAGNOSIS — Z639 Problem related to primary support group, unspecified: Secondary | ICD-10-CM | POA: Insufficient documentation

## 2015-10-10 MED ORDER — BACLOFEN 10 MG PO TABS: 10.0000 mg | ORAL_TABLET | Freq: Three times a day (TID) | ORAL | Status: DC

## 2015-10-10 MED ORDER — IBUPROFEN 600 MG PO TABS
600.0000 mg | ORAL_TABLET | Freq: Two times a day (BID) | ORAL | Status: DC | PRN
Start: 2015-10-10 — End: 2016-06-30

## 2015-10-10 NOTE — Progress Notes (Signed)
Subjective:    Patient ID: Henry Knight, male    DOB: [birth year], 65 y.o.   MRN: [id number]  HPI   Muscle cramp/soreness: Patient presents with a history of chronic hamstring "soreness "since October 2016. He states he does not recall any enticing injury, but around that time he was lifting heavy bags might of her his lower back. He states he is an established patient of Dr. Percell Miller, orthopedic, and was evaluated by him for this addition at that time. He states he was told to complete stretches, which she has done but had slacked off. He was also prescribed a muscle relaxer, which he states he could not tolerate and function secondary to sedation. Patient is a Development worker, international aid, owns his own business. He denies numbness, tingling or pain. He does admit to tightness and soreness in his bilateral hamstrings left greater the right. He has had right knee replacement, and surgery on his right shoulder.   Of note: He states that he is looking for referral to a therapist/psychologist to help him deal with his wife who is a Ship broker. He states that he has no marital problems. He is looking for something to help him, help her, because she will not seek treatment.  Past Medical History  Diagnosis Date  . Arthritis   . Neuromuscular disorder ([place])   . Essential tremor   . Melanoma ([place])     chest  . BCC (basal cell carcinoma of skin)   . HTN (hypertension)   . History of adenomatous polyp of colon 2007; 2016    Recall 5 yrs (Dr. Benson Norway)  . Diverticulosis 11/2014    Noted on 11/2014 colonoscopy: ascending, descending, and sigmoid   Allergies  Allergen Reactions  . Penicillins    Past Surgical History  Procedure Laterality Date  . Cosmetic surgery    . Knee surgery Right 2012  . Joint replacement Right 2012  . Shoulder arthroscopy w/ rotator cuff repair  2004  . Colonoscopy w/ polypectomy  approx 2007; 11/2014    Dr. Marcy Panning 5 yrs      Review of Systems Negative, with the exception of  above mentioned in HPI     Objective:   Physical Exam BP 141/106 mmHg  Pulse 68  Temp(Src) 98.2 F (36.8 C)  Resp 20  Wt 239 lb 12 oz (108.75 kg)  SpO2 96%  Body mass index is 32.51 kg/(m^2). Gen: Afebrile. No acute distress. Nontoxic in appearance, well-developed, well-nourished, mildly obese Caucasian male.  HENT: AT. [place]. MMM.  Eyes:Pupils Equal Round Reactive to light, Extraocular movements intact,  Conjunctiva without redness, discharge or icterus. CV: RRR  Chest: CTAB, no wheeze or crackles MSK: No erythema, tissue texture change/ropiness right lumbar paraspinal, fullness right lumbar paraspinal. No bony tenderness. No step-offs. Tenderness to palpation left distal hamstring insertion and muscle belly medially. Right ropiness of semimembranous medial semitendinosus muscles. Decreased flexion left hip. ASIS compression test positive for restriction on the left. ASIS inferior on left compared to ASIS on the right. Positive bending flexion test left. Neurovascularly intact distally.    Assessment & Plan:  1. Primary osteoarthritis involving multiple joints - ibuprofen (ADVIL,MOTRIN) 600 MG tablet; Take 1 tablet (600 mg total) by mouth 2 (two) times daily as needed.  Dispense: 60 tablet; Refill: 3   2. Hamstring tightness of both lower extremities/Pelvic somatic dysfunction - Hd been evaluated by Ortho. Pt did not seem interested in medications. Again discussed heat/stretches, muscle relaxers, physical therapy. Patient would benefit from lower  back strengthening to aid in realignment of pelvic floor/hips allowing for release of hamstrings. - Patient decided to use his physical therapy friend, who will provide him with proper stretches/training without incurring cost. - baclofen (LIORESAL) 10 MG tablet; Take 1 tablet (10 mg total) by mouth 3 (three) times daily.  Dispense: 30 each; Refill: 0 - discussed referral to sports medicine for continued monitoring.  could consider osteopathic  manipulation in the future. ? Dr. Charlann Boxer, DO?  F/U PRN

## 2015-10-10 NOTE — Patient Instructions (Addendum)
Hamstring Strain A hamstring strain is an injury that occurs when the hamstring muscles are overstretched or overloaded. The hamstring muscles are a group of muscles at the back of the thighs. These muscles are used in straightening the hips, bending the knees, and pulling back the legs. This type of injury is often called a pulled hamstring muscle. The severity of a muscle strain is rated in degrees. First-degree strains have the least amount of muscle fiber tearing and pain. Second-degree and third-degree strains have increasingly more tearing and pain. CAUSES Hamstring strains occur when a sudden, violent force is placed on these muscles and stretches them too far. This often occurs during activities that involve running, jumping, kicking, or weight lifting. RISK FACTORS Hamstring strains are especially common in athletes. Other things that can increase your risk for this injury include:  Having low strength, endurance, or flexibility of the hamstring muscles.  Performing high-impact physical activity.  Having poor physical fitness.  Having a previous leg injury.  Having fatigued muscles.  Older age. SIGNS AND SYMPTOMS  Pain in the back of the thigh.  Bruising.  Swelling.  Muscle spasm.  Difficulty using the muscle because of pain or lack of normal function. For severe strains, you may have a popping or snapping feeling when the injury occurs. DIAGNOSIS Your health care provider will perform a physical exam and ask about your medical history.  TREATMENT Often, the best treatment for a hamstring strain is protecting, resting, icing, applying compression, and elevating the injured area. This is referred to as the PRICE method of treatment. Your health care provider may also recommend medicines to help reduce pain or inflammation. HOME CARE INSTRUCTIONS  Use the PRICE method of treatment to promote muscle healing during the first 2-3 days after your injury. The PRICE method  involves:  P--Protecting the muscle from being injured again.  R--Restricting your activity and resting the injured body part.  I--Icing your injury. To do this, put ice in a plastic bag. Place a towel between your skin and the bag. Then, apply the ice and leave it on for 20 minutes, 2-3 times per day. After the third day, switch to moist heat packs.  C--Applying compression to the injured area with an elastic bandage. Be careful not to wrap it too tightly. That may interfere with blood circulation or may increase swelling.  E--Elevating the injured body part above the level of your heart as often as you can. You can do this by putting a pillow under your thigh when you sit or lie down.  Take medicines only as directed by your health care provider.  Begin exercising or stretching as directed by your health care provider.  Do not return to full activity level until your health care provider approves.  Keep all follow-up visits as directed by your health care provider. This is important. SEEK MEDICAL CARE IF:  You have increasing pain or swelling in the injured area.  You have numbness, tingling, or a significant loss of strength in the injured area.  Your foot or your toes become cold or turn blue.   This information is not intended to replace advice given to you by your health care provider. Make sure you discuss any questions you have with your health care provider.   Document Released: 06/15/2001 Document Revised: 10/11/2014 Document Reviewed: 05/06/2014 Elsevier Interactive Patient Education 2016 [place] need to stretch, use heat application and muscle relaxer.   I have called in baclofen and a  sports med referral for you. This will take some time and work to get better.  Talk to your PT friend and get some stretches/stregthening for your hamstrings, pelvic girdle and lower back.

## 2015-10-15 ENCOUNTER — Telehealth: Payer: Self-pay | Admitting: *Deleted

## 2015-10-15 NOTE — Telephone Encounter (Signed)
Left message for patient referral was already placed and They are trying to reach him to schedule.

## 2015-10-15 NOTE — Telephone Encounter (Signed)
Patient called and left message requesting a referral to Dr Raliegh Ip. Fields (sports medicine) He states you had recommended physical therapy but he feels he should see someone to find the cause before considering therapy. Please advise.

## 2015-10-15 NOTE — Telephone Encounter (Signed)
That referral has Henry Knight been placed at the time of his visit with me, and it appears in the referral section Epic that they have tried to call once.

## 2015-11-04 ENCOUNTER — Encounter: Payer: Self-pay | Admitting: Sports Medicine

## 2015-11-04 ENCOUNTER — Ambulatory Visit (INDEPENDENT_AMBULATORY_CARE_PROVIDER_SITE_OTHER): Payer: Medicare Other | Admitting: Sports Medicine

## 2015-11-04 VITALS — BP 164/99 | Ht 72.0 in | Wt 235.0 lb

## 2015-11-04 DIAGNOSIS — M629 Disorder of muscle, unspecified: Secondary | ICD-10-CM

## 2015-11-04 NOTE — Progress Notes (Signed)
   Subjective:    Patient ID: Henry Knight, male    DOB: 02/01/1951, 65 y.o.   MRN: [id number]  HPI chief complaint: Hamstring tightness  Henry Knight is a very pleasant 65 year old gentleman who comes in today complaining of bilateral hamstring pain. He does not recall any specific trauma but did have a sudden onset of left hamstring pain back in October after working in his yard. He then began to develop similar symptoms in his right hamstring. He describes a tightness and occasional cramping in his hamstrings which is worse with activity. He initially brought this to the attention of Dr. Percell Miller who gave him some simple stretches to start doing but that has not been helpful. He has also tried a couple of different muscle relaxers (Flexeril and baclofen), but these too have not been very helpful. He denies any associated numbness or tingling. He denies any discomfort past his knees. He does get occasional low back pain but it does not seem to be associated with his hamstring pain. He enjoys working out on an exercise bike. He has pain initially with his workout which resolves as he gets further into his ride. He denies any groin pain. He has not had any x-rays. No prior low back surgeries.  Past medical history reviewed Medications reviewed Surgical history reviewed. It is significant for a previous right total knee arthroplasty 4 years ago. He has also had a prior right shoulder arthroscopy. All of his orthopedic surgeries have been done by Dr. Percell Miller He has his own business in [place]    as above Objective:   Physical Exam Well-developed, well-nourished. No acute distress.  Neurological exam: Essential tremor noted  Lumbar spine: Full lumbar range of motion. He does have reproducible hamstring pain with forward flexion. No pain with extension. There is no tenderness to palpation along the lumbar midline nor over the paraspinal musculature. No spasm.  Examination  of both hamstrings show some tenderness to palpation along the musculotendinous area bilaterally. No palpable defect. Good strength. He definitely has tight hamstrings with forward flexion.       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 He understands that I would like for him to try to do these daily. He will follow-up with me in 4-6 weeks. It is possible that his symptoms are originating from his lumbar spine so if he fails to show any initial improvement at follow-up, then I would consider getting some x-rays of his L-spine. I think he is okay to continue with his current exercise plan. He will call me with questions or concerns prior to his follow-up visit.

## 2015-11-06 DIAGNOSIS — S76311D Strain of muscle, fascia and tendon of the posterior muscle group at thigh level, right thigh, subsequent encounter: Secondary | ICD-10-CM | POA: Diagnosis not present

## 2015-11-06 DIAGNOSIS — S76312D Strain of muscle, fascia and tendon of the posterior muscle group at thigh level, left thigh, subsequent encounter: Secondary | ICD-10-CM | POA: Diagnosis not present

## 2015-11-07 ENCOUNTER — Ambulatory Visit: Payer: Medicare Other | Admitting: Family Medicine

## 2015-11-24 ENCOUNTER — Telehealth: Payer: Self-pay | Admitting: *Deleted

## 2015-11-24 DIAGNOSIS — M8949 Other hypertrophic osteoarthropathy, multiple sites: Secondary | ICD-10-CM

## 2015-11-24 DIAGNOSIS — M150 Primary generalized (osteo)arthritis: Principal | ICD-10-CM

## 2015-11-24 DIAGNOSIS — M159 Polyosteoarthritis, unspecified: Secondary | ICD-10-CM

## 2015-11-24 MED ORDER — TRAMADOL HCL 50 MG PO TABS: 50.0000 mg | ORAL_TABLET | Freq: Every evening | ORAL | Status: DC | PRN

## 2015-11-24 NOTE — Telephone Encounter (Signed)
Pt will need to pick up Tramadol script. Printed today

## 2015-11-24 NOTE — Telephone Encounter (Signed)
Patient called left message requesting Tramadol for his joint pain. Patient states he has had this medication in the past and it has helped. Please advise.

## 2015-11-25 NOTE — Telephone Encounter (Signed)
Script up front for pick up

## 2015-11-26 DIAGNOSIS — S76311D Strain of muscle, fascia and tendon of the posterior muscle group at thigh level, right thigh, subsequent encounter: Secondary | ICD-10-CM | POA: Diagnosis not present

## 2015-11-26 DIAGNOSIS — S76312D Strain of muscle, fascia and tendon of the posterior muscle group at thigh level, left thigh, subsequent encounter: Secondary | ICD-10-CM | POA: Diagnosis not present

## 2015-12-03 DIAGNOSIS — B009 Herpesviral infection, unspecified: Secondary | ICD-10-CM | POA: Diagnosis not present

## 2015-12-03 DIAGNOSIS — S76312D Strain of muscle, fascia and tendon of the posterior muscle group at thigh level, left thigh, subsequent encounter: Secondary | ICD-10-CM | POA: Diagnosis not present

## 2015-12-03 DIAGNOSIS — L259 Unspecified contact dermatitis, unspecified cause: Secondary | ICD-10-CM | POA: Diagnosis not present

## 2015-12-03 DIAGNOSIS — S76311D Strain of muscle, fascia and tendon of the posterior muscle group at thigh level, right thigh, subsequent encounter: Secondary | ICD-10-CM | POA: Diagnosis not present

## 2016-01-07 ENCOUNTER — Other Ambulatory Visit: Payer: Self-pay | Admitting: *Deleted

## 2016-01-07 ENCOUNTER — Other Ambulatory Visit: Payer: Self-pay | Admitting: Family Medicine

## 2016-01-07 MED ORDER — LORAZEPAM 1 MG PO TABS: 1.0000 mg | ORAL_TABLET | Freq: Every day | ORAL | Status: DC | PRN

## 2016-01-07 NOTE — Telephone Encounter (Signed)
RF request for lorazepam LOV: 10/10/15 - acute visit Next ov: None Last written: 08/06/15 #30 w/ 2RF  Please advise. Thanks.

## 2016-01-08 NOTE — Telephone Encounter (Signed)
Rx did not print. Please reprint. Thanks.

## 2016-01-09 ENCOUNTER — Other Ambulatory Visit: Payer: Self-pay | Admitting: *Deleted

## 2016-01-09 MED ORDER — LORAZEPAM 1 MG PO TABS: 1.0000 mg | ORAL_TABLET | Freq: Every day | ORAL | Status: DC | PRN

## 2016-01-09 NOTE — Telephone Encounter (Signed)
Rx faxed

## 2016-01-09 NOTE — Telephone Encounter (Signed)
Pharmacy called in regards to this medication. Dr. Raoul Pitch printed Rx on Wednesday before she left but she did not sign it. Will you please reprint and sign. Thanks.

## 2016-02-10 ENCOUNTER — Encounter: Payer: Self-pay | Admitting: Family Medicine

## 2016-02-10 ENCOUNTER — Ambulatory Visit (INDEPENDENT_AMBULATORY_CARE_PROVIDER_SITE_OTHER): Payer: Medicare Other

## 2016-02-10 ENCOUNTER — Ambulatory Visit (INDEPENDENT_AMBULATORY_CARE_PROVIDER_SITE_OTHER): Payer: Medicare Other | Admitting: Family Medicine

## 2016-02-10 VITALS — BP 164/103 | HR 73 | Temp 98.1°F | Ht 72.0 in | Wt 238.8 lb

## 2016-02-10 DIAGNOSIS — S2231XA Fracture of one rib, right side, initial encounter for closed fracture: Secondary | ICD-10-CM | POA: Diagnosis not present

## 2016-02-10 DIAGNOSIS — R0781 Pleurodynia: Secondary | ICD-10-CM

## 2016-02-10 DIAGNOSIS — W19XXXA Unspecified fall, initial encounter: Secondary | ICD-10-CM

## 2016-02-10 MED ORDER — NAPROXEN 500 MG PO TABS: 500.0000 mg | ORAL_TABLET | Freq: Two times a day (BID) | ORAL | Status: DC

## 2016-02-10 MED ORDER — CYCLOBENZAPRINE HCL 5 MG PO TABS: 5.0000 mg | ORAL_TABLET | Freq: Three times a day (TID) | ORAL | Status: DC | PRN

## 2016-02-10 NOTE — Patient Instructions (Signed)
Rib Contusion A rib contusion is a deep bruise on your rib area. Contusions are the result of a blunt trauma that causes bleeding and injury to the tissues under the skin. A rib contusion may involve bruising of the ribs and of the skin and muscles in the area. The skin overlying the contusion may turn blue, purple, or yellow. Minor injuries will give you a painless contusion, but more severe contusions may stay painful and swollen for a few weeks. CAUSES  A contusion is usually caused by a blow, trauma, or direct force to an area of the body. This often occurs while playing contact sports. SYMPTOMS  Swelling and redness of the injured area.  Discoloration of the injured area.  Tenderness and soreness of the injured area.  Pain with or without movement. DIAGNOSIS  The diagnosis can be made by taking a medical history and performing a physical exam. An X-ray, CT scan, or MRI may be needed to determine if there were any associated injuries, such as broken bones (fractures) or internal injuries. TREATMENT  Often, the best treatment for a rib contusion is rest. Icing or applying cold compresses to the injured area may help reduce swelling and inflammation. Deep breathing exercises may be recommended to reduce the risk of partial lung collapse and pneumonia. Over-the-counter or prescription medicines may also be recommended for pain control. HOME CARE INSTRUCTIONS   Apply ice to the injured area:  Put ice in a plastic bag.  Place a towel between your skin and the bag.  Leave the ice on for 20 minutes, 2-3 times per day.  Take medicines only as directed by your health care provider.  Rest the injured area. Avoid strenuous activity and any activities or movements that cause pain. Be careful during activities and avoid bumping the injured area.  Perform deep-breathing exercises as directed by your health care provider.  Do not lift anything that is heavier than 5 lb (2.3 kg) until your  health care provider approves.  Do not use any tobacco products, including cigarettes, chewing tobacco, or electronic cigarettes. If you need help quitting, ask your health care provider. SEEK MEDICAL CARE IF:   You have increased bruising or swelling.  You have pain that is not controlled with treatment.  You have a fever. SEEK IMMEDIATE MEDICAL CARE IF:   You have difficulty breathing or shortness of breath.  You develop a continual cough, or you cough up thick or bloody sputum.  You feel sick to your stomach (nauseous), you throw up (vomit), or you have abdominal pain.   This information is not intended to replace advice given to you by your health care provider. Make sure you discuss any questions you have with your health care provider.   Document Released: 06/15/2001 Document Revised: 10/11/2014 Document Reviewed: 07/02/2014 Elsevier Interactive Patient Education 2016 [place].   Rib Fracture A rib fracture is a break or crack in one of the bones of the ribs. The ribs are a group of long, curved bones that wrap around your chest and attach to your spine. They protect your lungs and other organs in the chest cavity. A broken or cracked rib is often painful, but most do not cause other problems. Most rib fractures heal on their own over time. However, rib fractures can be more serious if multiple ribs are broken or if broken ribs move out of place and push against other structures. CAUSES   A direct blow to the chest. For example, this could  happen during contact sports, a car accident, or a fall against a hard object.  Repetitive movements with high force, such as pitching a baseball or having severe coughing spells. SYMPTOMS   Pain when you breathe in or cough.  Pain when someone presses on the injured area. DIAGNOSIS  Your caregiver will perform a physical exam. Various imaging tests may be ordered to confirm the diagnosis and to look for related injuries. These tests  may include a chest X-ray, computed tomography (CT), magnetic resonance imaging (MRI), or a bone scan. TREATMENT  Rib fractures usually heal on their own in 1-3 months. The longer healing period is often associated with a continued cough or other aggravating activities. During the healing period, pain control is very important. Medication is usually given to control pain. Hospitalization or surgery may be needed for more severe injuries, such as those in which multiple ribs are broken or the ribs have moved out of place.  HOME CARE INSTRUCTIONS   Avoid strenuous activity and any activities or movements that cause pain. Be careful during activities and avoid bumping the injured rib.  Gradually increase activity as directed by your caregiver.  Only take over-the-counter or prescription medications as directed by your caregiver. Do not take other medications without asking your caregiver first.  Apply ice to the injured area for the first 1-2 days after you have been treated or as directed by your caregiver. Applying ice helps to reduce inflammation and pain.  Put ice in a plastic bag.  Place a towel between your skin and the bag.   Leave the ice on for 15-20 minutes at a time, every 2 hours while you are awake.  Perform deep breathing as directed by your caregiver. This will help prevent pneumonia, which is a common complication of a broken rib. Your caregiver may instruct you to:  Take deep breaths several times a day.  Try to cough several times a day, holding a pillow against the injured area.  Use a device called an incentive spirometer to practice deep breathing several times a day.  Drink enough fluids to keep your urine clear or pale yellow. This will help you avoid constipation.   Do not wear a rib belt or binder. These restrict breathing, which can lead to pneumonia.  SEEK IMMEDIATE MEDICAL CARE IF:   You have a fever.   You have difficulty breathing or shortness of  breath.   You develop a continual cough, or you cough up thick or bloody sputum.  You feel sick to your stomach (nausea), throw up (vomit), or have abdominal pain.   You have worsening pain not controlled with medications.  MAKE SURE YOU:  Understand these instructions.  Will watch your condition.  Will get help right away if you are not doing well or get worse.   This information is not intended to replace advice given to you by your health care provider. Make sure you discuss any questions you have with your health care provider.   Document Released: 09/20/2005 Document Revised: 05/23/2013 Document Reviewed: 11/22/2012 Elsevier Interactive Patient Education [employer].

## 2016-02-10 NOTE — Progress Notes (Signed)
Patient ID: Henry Knight, male   DOB: 05-12-1951, 65 y.o.   MRN: [id number]    Henry Knight , May 09, 1951, 65 y.o., male MRN: [id number]  CC: right rib pain Subjective: Pt presents for an acute OV with complaints of right rib pain after falling 4 days ago. Patient states he was feeling okay and thought he was healing well until he sneezed yesterday. He states after he sneezed he had a sharp pain on his right back/side over his ribs. Patient states he fell on Saturday when he tripped in his new shoes and fell flat on cemented landing on his right side. Patient states it hurts now when he takes a deep breath. He denies any shortness of breath, dizziness, fever, increased bruising, abdominal pain or cough. He denies any prior injuries to his back or ribs, and this location.   Allergies  Allergen Reactions  . Penicillins    Social History  Substance Use Topics  . Smoking status: Never Smoker   . Smokeless tobacco: Never Used  . Alcohol Use: 4.8 oz/week    8 Glasses of wine per week   Past Medical History  Diagnosis Date  . Arthritis   . Neuromuscular disorder ([place])   . Essential tremor   . Melanoma ([place])     chest  . BCC (basal cell carcinoma of skin)   . HTN (hypertension)   . History of adenomatous polyp of colon 2007; 2016    Recall 5 yrs (Dr. Benson Norway)  . Diverticulosis 11/2014    Noted on 11/2014 colonoscopy: ascending, descending, and sigmoid   Past Surgical History  Procedure Laterality Date  . Cosmetic surgery    . Knee surgery Right 2012  . Joint replacement Right 2012  . Shoulder arthroscopy w/ rotator cuff repair  2004  . Colonoscopy w/ polypectomy  approx 2007; 11/2014    Dr. Marcy Panning 5 yrs    Family History  Problem Relation Age of Onset  . Heart disease Father   . Alcohol abuse Father   . Obesity Brother   . Asperger's syndrome Brother   . Obesity Mother   . Tremor Mother   . Anxiety disorder Mother      Medication List       This list is  accurate as of: 02/10/16  3:40 PM.  Always use your most recent med list.               ibuprofen 600 MG tablet  Commonly known as:  ADVIL,MOTRIN  Take 1 tablet (600 mg total) by mouth 2 (two) times daily as needed.     LORazepam 1 MG tablet  Commonly known as:  ATIVAN  Take 1 tablet (1 mg total) by mouth daily as needed for anxiety.     traMADol 50 MG tablet  Commonly known as:  ULTRAM  Take 1 tablet (50 mg total) by mouth at bedtime as needed.         ROS: Negative, with the exception of above mentioned in HPI  Objective:  BP 164/103 mmHg  Pulse 73  Temp(Src) 98.1 F (36.7 C) (Oral)  Ht 6' (1.829 m)  Wt 238 lb 12.8 oz (108.319 kg)  BMI 32.38 kg/m2  SpO2 96% Body mass index is 32.38 kg/(m^2). Gen: Afebrile. No acute distress. Nontoxic in appearance, well-developed, well-nourished, Caucasian male. HENT: AT. .  MMM, no oral lesions. Eyes:Pupils Equal Round Reactive to light, Extraocular movements intact,  Conjunctiva without redness, discharge or icterus. CV: RRR, no edema bilateral  lower extremity Chest: CTAB, no wheeze or crackles. Good air movement, normal resp effort.  Abd: Soft. NTND. BS present. No hepatosplenomegaly. MSK: No erythema, mild abrasion right posterior thoracic area. Mild bruising. Tender to palpation right posterior rib 10/11 Neuro: Normal gait. PERLA. EOMi. Alert. Oriented x3   Assessment/Plan: RONDO KOEL is a 65 y.o. male present for acute OV for  1. Rib pain on right side - Discussed with patient the possibility of further strain versus rib fracture. There is point tenderness to palpation over right posterior rib around 10/11. - No concern for pneumothorax or internal bleeding on exam. - patient to have x-ray of right ribs today, discussed with him treatment for rib fractures her to control the pain and allow time to heal. Patient was encouraged as long as not extremely painful to take deep breaths at least a few times an hour to prevent  pneumonia. Precautions were discussed with patient and when to seek emergent care. - DG Ribs Unilateral Right; Future - cyclobenzaprine (FLEXERIL) 5 MG tablet; Take 1 tablet (5 mg total) by mouth 3 (three) times daily as needed for muscle spasms.  Dispense: 30 tablet; Refill: 1 - naproxen (NAPROSYN) 500 MG tablet; Take 1 tablet (500 mg total) by mouth 2 (two) times daily with a meal.  Dispense: 30 tablet; Refill: 0   electronically signed by:  Howard Pouch, DO  [place]

## 2016-02-11 ENCOUNTER — Telehealth: Payer: Self-pay | Admitting: Family Medicine

## 2016-02-11 NOTE — Telephone Encounter (Signed)
Called pt to discuss: -Minimally displaced 11th right Rib fracture. Discussed personally with patient today that he does have a small rib fracture in the 11th rib. Encouraged him to not lift weight or perform heavy exercise for the next 4-6 weeks, using pain as his guide. She was advised the complications we normally see from such an injury, if any, is the development of pneumonia. Patient was encouraged to take be cognizant of taking deep breaths at least a few times an hour. Cautions given on increased short of breath, increased pain, dizziness or development of cough/fever, if patient experiences any of the above he was advised to seek immediate attention. Patient voiced understanding.  Dg Ribs Unilateral Right  02/10/2016  CLINICAL DATA:  Patient c/o right posterior lower rib pains s/p fall on 02/07/16, has bruising to this area as well, per MD does not want a chest x-ray EXAM: RIGHT RIBS - 2 VIEW COMPARISON:  05/02/2009 FINDINGS: Minimal displaced fracture, posterolateral aspect right eleventh rib. No pneumothorax or effusion. Right lung clear. IMPRESSION: 1. Minimally displaced right eleventh rib fracture. Electronically Signed   By: Lucrezia Europe M.D.   On: 02/10/2016 18:47

## 2016-03-11 ENCOUNTER — Encounter: Payer: Self-pay | Admitting: Family Medicine

## 2016-03-11 ENCOUNTER — Ambulatory Visit (INDEPENDENT_AMBULATORY_CARE_PROVIDER_SITE_OTHER): Payer: Medicare Other | Admitting: Family Medicine

## 2016-03-11 VITALS — BP 132/85 | HR 70 | Temp 98.0°F | Resp 20 | Wt 234.0 lb

## 2016-03-11 DIAGNOSIS — J209 Acute bronchitis, unspecified: Secondary | ICD-10-CM

## 2016-03-11 DIAGNOSIS — H7393 Unspecified disorder of tympanic membrane, bilateral: Secondary | ICD-10-CM

## 2016-03-11 MED ORDER — HYDROCODONE-HOMATROPINE 5-1.5 MG/5ML PO SYRP: 5.0000 mL | ORAL_SOLUTION | Freq: Three times a day (TID) | ORAL | Status: DC | PRN

## 2016-03-11 MED ORDER — DOXYCYCLINE HYCLATE 100 MG PO TABS: 100.0000 mg | ORAL_TABLET | Freq: Two times a day (BID) | ORAL | Status: DC

## 2016-03-11 NOTE — Progress Notes (Signed)
Patient ID: Henry Knight, male   DOB: January 24, 1951, 65 y.o.   MRN: [id number]    Henry Knight , [birth year], 65 y.o., male MRN: [id number]  CC: cough Subjective: Pt presents for an acute OV with complaints of cough of 2 weeks duration. Associated symptoms include chest congestion, decreased sleep from cough, fatigue, sneeze, hoarseness. He states it first started with a virus his daughter brought home that started with GI symptoms and  now progressed to chest symptoms. His cough has made his broken rib start to hurt again.  Patient denies fever, chills, nasal congestion, rhinorrhea, sinus pressure. Of note: Fractured Rib healed well. He has tried OTC meds, without great benefit.  Tdap and flu UTD.   Allergies  Allergen Reactions  . Penicillins    Social History  Substance Use Topics  . Smoking status: Never Smoker   . Smokeless tobacco: Never Used  . Alcohol Use: 4.8 oz/week    8 Glasses of wine per week   Past Medical History  Diagnosis Date  . Arthritis   . Neuromuscular disorder ([place])   . Essential tremor   . Melanoma ([place])     chest  . BCC (basal cell carcinoma of skin)   . HTN (hypertension)   . History of adenomatous polyp of colon 2007; 2016    Recall 5 yrs (Dr. Benson Norway)  . Diverticulosis 11/2014    Noted on 11/2014 colonoscopy: ascending, descending, and sigmoid   Past Surgical History  Procedure Laterality Date  . Cosmetic surgery    . Knee surgery Right 2012  . Joint replacement Right 2012  . Shoulder arthroscopy w/ rotator cuff repair  2004  . Colonoscopy w/ polypectomy  approx 2007; 11/2014    Dr. Marcy Panning 5 yrs    Family History  Problem Relation Age of Onset  . Heart disease Father   . Alcohol abuse Father   . Obesity Brother   . Asperger's syndrome Brother   . Obesity Mother   . Tremor Mother   . Anxiety disorder Mother      Medication List       This list is accurate as of: 03/11/16  8:41 AM.  Always use your most recent med list.               cyclobenzaprine 5 MG tablet  Commonly known as:  FLEXERIL  Take 1 tablet (5 mg total) by mouth 3 (three) times daily as needed for muscle spasms.     ibuprofen 600 MG tablet  Commonly known as:  ADVIL,MOTRIN  Take 1 tablet (600 mg total) by mouth 2 (two) times daily as needed.     LORazepam 1 MG tablet  Commonly known as:  ATIVAN  Take 1 tablet (1 mg total) by mouth daily as needed for anxiety.     naproxen 500 MG tablet  Commonly known as:  NAPROSYN  Take 1 tablet (500 mg total) by mouth 2 (two) times daily with a meal.     traMADol 50 MG tablet  Commonly known as:  ULTRAM  Take 1 tablet (50 mg total) by mouth at bedtime as needed.        ROS: Negative, with the exception of above mentioned in HPI  Objective:  BP 132/85 mmHg  Pulse 70  Temp(Src) 98 F (36.7 C)  Resp 20  Wt 234 lb (106.142 kg)  SpO2 97% Body mass index is 31.73 kg/(m^2). Gen: Afebrile. No acute distress. Nontoxic in appearance, well developed, well nourished.  HENT: AT. . Bilateral TM visualized, RT TM with 2-3 masses, LT TM normal , Rt EAM with multiple masses, LT EAM mass x1, flesh toned.MMM, no oral lesions. Bilateral nares with erythema, no swelling. Throat without erythema or exudates. Mild cough, mild hoarseness. No TTP facial sinus.  Eyes:Pupils Equal Round Reactive to light, Extraocular movements intact,  Conjunctiva without redness, discharge or icterus. Neck/lymp/endocrine: Supple,No lymphadenopathy CV: RRR  Chest: CTAB, no wheeze or crackles. Good air movement, normal resp effort.  Abd: Soft. NTND. BS present   Assessment/Plan: Henry Knight is a 65 y.o. male present for acute OV for  Acute bronchitis, unspecified organism/Cough - rest, hydrate - doxycycline (VIBRA-TABS) 100 MG tablet; Take 1 tablet (100 mg total) by mouth 2 (two) times daily.  Dispense: 20 tablet; Refill: 0 - HYDROcodone-homatropine (HYCODAN) 5-1.5 MG/5ML syrup; Take 5 mLs by mouth every 8 (eight)  hours as needed for cough.  Dispense: 120 mL; Refill: 0   Abnormal Ear anatomy/mass bilateral EOM and TM: ENT referral    > 25 minutes spent with patient, >50% of time spent face to face counseling patient and coordinating care.  electronically signed by:  Howard Pouch, DO  [place]

## 2016-03-11 NOTE — Patient Instructions (Signed)
Medicare wellness visit yearly for screenings and immunizations.  ENT referral placed again for abnormal ear anatomy.  Doxycycline for bronchitis and cough syrup. Mucinex palin for secretions and cough.   Acute Bronchitis Bronchitis is inflammation of the airways that extend from the windpipe into the lungs (bronchi). The inflammation often causes mucus to develop. This leads to a cough, which is the most common symptom of bronchitis.  In acute bronchitis, the condition usually develops suddenly and goes away over time, usually in a couple weeks. Smoking, allergies, and asthma can make bronchitis worse. Repeated episodes of bronchitis may cause further lung problems.  CAUSES Acute bronchitis is most often caused by the same virus that causes a cold. The virus can spread from person to person (contagious) through coughing, sneezing, and touching contaminated objects. SIGNS AND SYMPTOMS   Cough.   Fever.   Coughing up mucus.   Body aches.   Chest congestion.   Chills.   Shortness of breath.   Sore throat.  DIAGNOSIS  Acute bronchitis is usually diagnosed through a physical exam. Your health care provider will also ask you questions about your medical history. Tests, such as chest X-rays, are sometimes done to rule out other conditions.  TREATMENT  Acute bronchitis usually goes away in a couple weeks. Oftentimes, no medical treatment is necessary. Medicines are sometimes given for relief of fever or cough. Antibiotic medicines are usually not needed but may be prescribed in certain situations. In some cases, an inhaler may be recommended to help reduce shortness of breath and control the cough. A cool mist vaporizer may also be used to help thin bronchial secretions and make it easier to clear the chest.  HOME CARE INSTRUCTIONS  Get plenty of rest.   Drink enough fluids to keep your urine clear or pale yellow (unless you have a medical condition that requires fluid  restriction). Increasing fluids may help thin your respiratory secretions (sputum) and reduce chest congestion, and it will prevent dehydration.   Take medicines only as directed by your health care provider.  If you were prescribed an antibiotic medicine, finish it all even if you start to feel better.  Avoid smoking and secondhand smoke. Exposure to cigarette smoke or irritating chemicals will make bronchitis worse. If you are a smoker, consider using nicotine gum or skin patches to help control withdrawal symptoms. Quitting smoking will help your lungs heal faster.   Reduce the chances of another bout of acute bronchitis by washing your hands frequently, avoiding people with cold symptoms, and trying not to touch your hands to your mouth, nose, or eyes.   Keep all follow-up visits as directed by your health care provider.  SEEK MEDICAL CARE IF: Your symptoms do not improve after 1 week of treatment.  SEEK IMMEDIATE MEDICAL CARE IF:  You develop an increased fever or chills.   You have chest pain.   You have severe shortness of breath.  You have bloody sputum.   You develop dehydration.  You faint or repeatedly feel like you are going to pass out.  You develop repeated vomiting.  You develop a severe headache. MAKE SURE YOU:   Understand these instructions.  Will watch your condition.  Will get help right away if you are not doing well or get worse.   This information is not intended to replace advice given to you by your health care provider. Make sure you discuss any questions you have with your health care provider.   Document Released: 10/28/2004  Document Revised: 10/11/2014 Document Reviewed: 03/13/2013 Elsevier Interactive Patient Education [employer].

## 2016-03-31 DIAGNOSIS — L723 Sebaceous cyst: Secondary | ICD-10-CM | POA: Diagnosis not present

## 2016-03-31 DIAGNOSIS — L821 Other seborrheic keratosis: Secondary | ICD-10-CM | POA: Diagnosis not present

## 2016-03-31 DIAGNOSIS — L570 Actinic keratosis: Secondary | ICD-10-CM | POA: Diagnosis not present

## 2016-03-31 DIAGNOSIS — D239 Other benign neoplasm of skin, unspecified: Secondary | ICD-10-CM | POA: Diagnosis not present

## 2016-04-14 DIAGNOSIS — D164 Benign neoplasm of bones of skull and face: Secondary | ICD-10-CM | POA: Diagnosis not present

## 2016-04-21 ENCOUNTER — Encounter: Payer: Self-pay | Admitting: Physician Assistant

## 2016-04-21 DIAGNOSIS — D169 Benign neoplasm of bone and articular cartilage, unspecified: Secondary | ICD-10-CM | POA: Insufficient documentation

## 2016-06-10 ENCOUNTER — Other Ambulatory Visit: Payer: Self-pay | Admitting: *Deleted

## 2016-06-10 MED ORDER — LORAZEPAM 1 MG PO TABS: 1.0000 mg | ORAL_TABLET | Freq: Every day | ORAL | 2 refills | Status: DC | PRN

## 2016-06-10 NOTE — Telephone Encounter (Signed)
Refill request received from pharmacy for Lorazepam 1 mg . Last filled 01/09/16 by Dr Anitra Lauth for 30 tabs with 2 refills. I dont see where we have evaluated him for this medication . Do you want to Rx. Please advise

## 2016-06-30 ENCOUNTER — Other Ambulatory Visit: Payer: Self-pay | Admitting: *Deleted

## 2016-06-30 DIAGNOSIS — M159 Polyosteoarthritis, unspecified: Secondary | ICD-10-CM

## 2016-06-30 DIAGNOSIS — M8949 Other hypertrophic osteoarthropathy, multiple sites: Secondary | ICD-10-CM

## 2016-06-30 DIAGNOSIS — M150 Primary generalized (osteo)arthritis: Principal | ICD-10-CM

## 2016-06-30 MED ORDER — IBUPROFEN 600 MG PO TABS: 600.0000 mg | ORAL_TABLET | Freq: Two times a day (BID) | ORAL | 3 refills | Status: DC | PRN

## 2016-06-30 NOTE — Telephone Encounter (Signed)
Patient requesting refill on ibuprofen 600 mg last filled 10/10/15 60 with 3 refills. Patient last seen 03/11/16 for URI. This was Rx'd for osteoarthritis  On 10/10/15. Do you want to refill this?

## 2016-08-11 ENCOUNTER — Encounter: Payer: Self-pay | Admitting: Family Medicine

## 2016-08-11 ENCOUNTER — Ambulatory Visit (INDEPENDENT_AMBULATORY_CARE_PROVIDER_SITE_OTHER): Payer: Medicare Other | Admitting: Family Medicine

## 2016-08-11 VITALS — BP 131/88 | HR 66 | Temp 98.2°F | Resp 20 | Ht 72.0 in | Wt 236.5 lb

## 2016-08-11 DIAGNOSIS — E781 Pure hyperglyceridemia: Secondary | ICD-10-CM | POA: Diagnosis not present

## 2016-08-11 DIAGNOSIS — Z23 Encounter for immunization: Secondary | ICD-10-CM | POA: Diagnosis not present

## 2016-08-11 DIAGNOSIS — Z125 Encounter for screening for malignant neoplasm of prostate: Secondary | ICD-10-CM

## 2016-08-11 DIAGNOSIS — Z0000 Encounter for general adult medical examination without abnormal findings: Secondary | ICD-10-CM

## 2016-08-11 DIAGNOSIS — Z6832 Body mass index (BMI) 32.0-32.9, adult: Secondary | ICD-10-CM

## 2016-08-11 DIAGNOSIS — I10 Essential (primary) hypertension: Secondary | ICD-10-CM

## 2016-08-11 LAB — LIPID PANEL
CHOL/HDL RATIO: 6
Cholesterol: 195 mg/dL (ref 0–200)
HDL: 31.9 mg/dL — AB (ref >39.00)
NONHDL: 163.35
TRIGLYCERIDES: 321 mg/dL — AB (ref 0.0–149.0)
VLDL: 64.2 mg/dL — AB (ref 0.0–40.0)

## 2016-08-11 LAB — COMPREHENSIVE METABOLIC PANEL
ALT: 21 U/L (ref 0–53)
AST: 21 U/L (ref 0–37)
Albumin: 4.5 g/dL (ref 3.5–5.2)
Alkaline Phosphatase: 64 U/L (ref 39–117)
BILIRUBIN TOTAL: 0.8 mg/dL (ref 0.2–1.2)
BUN: 17 mg/dL (ref 6–23)
CALCIUM: 9.5 mg/dL (ref 8.4–10.5)
CHLORIDE: 105 meq/L (ref 96–112)
CO2: 26 meq/L (ref 19–32)
Creatinine, Ser: 0.89 mg/dL (ref 0.40–1.50)
GFR: 90.71 mL/min (ref >60.00)
GLUCOSE: 97 mg/dL (ref 70–99)
POTASSIUM: 4.3 meq/L (ref 3.5–5.1)
Sodium: 139 mEq/L (ref 135–145)
Total Protein: 7 g/dL (ref 6.0–8.3)

## 2016-08-11 LAB — CBC WITH DIFFERENTIAL/PLATELET
BASOS ABS: 0 10*3/uL (ref 0.0–0.1)
BASOS PCT: 0.8 % (ref 0.0–3.0)
Eosinophils Absolute: 0.1 10*3/uL (ref 0.0–0.7)
Eosinophils Relative: 2.8 % (ref 0.0–5.0)
HEMATOCRIT: 45.8 % (ref 39.0–52.0)
Hemoglobin: 15.6 g/dL (ref 13.0–17.0)
LYMPHS ABS: 1.6 10*3/uL (ref 0.7–4.0)
Lymphocytes Relative: 35.9 % (ref 12.0–46.0)
MCHC: 34.1 g/dL (ref 30.0–36.0)
MCV: 94.9 fl (ref 78.0–100.0)
MONOS PCT: 8.8 % (ref 3.0–12.0)
Monocytes Absolute: 0.4 10*3/uL (ref 0.1–1.0)
NEUTROS ABS: 2.3 10*3/uL (ref 1.4–7.7)
NEUTROS PCT: 51.7 % (ref 43.0–77.0)
PLATELETS: 210 10*3/uL (ref 150.0–400.0)
RBC: 4.82 Mil/uL (ref 4.22–5.81)
RDW: 12.7 % (ref 11.5–15.5)
WBC: 4.5 10*3/uL (ref 4.0–10.5)

## 2016-08-11 LAB — HEMOGLOBIN A1C: Hgb A1c MFr Bld: 5.3 % (ref 4.6–6.5)

## 2016-08-11 LAB — PSA: PSA: 0.82 ng/mL (ref 0.10–4.00)

## 2016-08-11 LAB — LDL CHOLESTEROL, DIRECT: LDL DIRECT: 88 mg/dL

## 2016-08-11 MED ORDER — ZOSTER VACCINE LIVE 19400 UNT/0.65ML ~~LOC~~ SUSR: 0.6500 mL | Freq: Once | SUBCUTANEOUS | 0 refills | Status: DC

## 2016-08-11 MED ORDER — ZOSTER VACCINE LIVE 19400 UNT/0.65ML ~~LOC~~ SUSR: 0.6500 mL | Freq: Once | SUBCUTANEOUS | 0 refills | Status: AC

## 2016-08-11 NOTE — Addendum Note (Signed)
Addended by: Leota Jacobsen on: 08/11/2016 10:12 AM   Modules accepted: Orders

## 2016-08-11 NOTE — Patient Instructions (Signed)

## 2016-08-11 NOTE — Progress Notes (Signed)
Patient ID: Henry Knight, male  DOB: September 09, 1951, 65 y.o.   MRN: [id number] Patient Care Team    Relationship Specialty Notifications Start End  Ma Hillock, DO PCP - General Family Medicine  07/11/15   Carol Ada, MD Consulting Physician Gastroenterology  08/11/16   Gaynelle Arabian, MD Consulting Physician Orthopedic Surgery  08/11/16   Melida Quitter, MD Consulting Physician Otolaryngology  08/11/16     Subjective:  Henry Knight is a 65 y.o. male present for CPE. All past medical history, surgical history, allergies, family history, immunizations, medications and social history were updated in the electronic medical record today. All recent labs, ED visits and hospitalizations within the last year were reviewed.  Pt has no complaints today. He is scheduled to see to Dr. Maureen Ralphs for his left knee, which he feels will likely need replaced. He also feels his tremor is mildly worse, but he has been drinking caffeine.   Health maintenance:  Colonoscopy: last screen 11/2014, recommend follow up 5 years; resulted 1 polyp. Completed by Dr. Benson Norway. Immunizations:  tdap UTD 2015, influenza admin today, PNA series prevnar 13 today- PSV23 08/2017, zostavax printed for patient.  Infectious disease screening:  Hep C completed PSA:  Lab Results  Component Value Date   PSA 0.61 08/07/2014  , pt was counseled on prostate cancer screenings.  Assistive device: None Oxygen use: None Patient has a Dental home. Hospitalizations/ED visits: None  Depression screen [facility] 2/9 08/11/2016 11/04/2015 11/04/2015  Decreased Interest 0 0 0  Down, Depressed, Hopeless 0 0 0  PHQ - 2 Score 0 0 0     Immunization History  Administered Date(s) Administered  . Influenza,inj,Quad PF,36+ Mos 07/11/2013, 08/07/2014, 07/11/2015  . Tdap 06/26/2014     Past Medical History:  Diagnosis Date  . Arthritis   . BCC (basal cell carcinoma of skin)   . Diverticulosis 11/2014   Noted on 11/2014 colonoscopy:  ascending, descending, and sigmoid  . Essential tremor   . History of adenomatous polyp of colon 2007; 2016   Recall 5 yrs (Dr. Benson Norway)  . HTN (hypertension)   . Melanoma ([place])    chest  . Neuromuscular disorder ([place])    Allergies  Allergen Reactions  . Penicillins    Past Surgical History:  Procedure Laterality Date  . COLONOSCOPY W/ POLYPECTOMY  approx 2007; 11/2014   Dr. Marcy Panning 5 yrs   . COSMETIC SURGERY    . JOINT REPLACEMENT Right 2012  . KNEE SURGERY Right 2012  . SHOULDER ARTHROSCOPY W/ ROTATOR CUFF REPAIR  2004   Family History  Problem Relation Age of Onset  . Heart disease Father   . Alcohol abuse Father   . Obesity Brother   . Asperger's syndrome Brother   . Obesity Mother   . Tremor Mother   . Anxiety disorder Mother    Social History   Social History  . Marital status: Married    Spouse name: N/A  . Number of children: 1  . Years of education: N/A   Occupational History  . Not on file.   Social History Main Topics  . Smoking status: Never Smoker  . Smokeless tobacco: Never Used  . Alcohol use 4.8 oz/week    8 Glasses of wine per week  . Drug use: No  . Sexual activity: Yes   Other Topics Concern  . 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 He lives with  his wife & daughter.     Medication List       Accurate as of 08/11/16  9:52 AM. Always use your most recent med list.          ECHINACEA EXTRACT PO Take by mouth.   Fish Oil 1000 MG Caps Take 1 capsule by mouth daily.   LORazepam 1 MG tablet Commonly known as:  ATIVAN Take 1 tablet (1 mg total) by mouth daily as needed for anxiety.   multivitamin capsule Take 1 capsule by mouth daily.   traMADol 50 MG tablet Commonly known as:  ULTRAM Take 1 tablet (50 mg total) by mouth at bedtime as needed.   TURMERIC PO Take by mouth.   Zoster Vaccine Live (PF) 19400 UNT/0.65ML injection Commonly known as:  ZOSTAVAX Inject 19,400 Units  into the skin once.        No results found for this or any previous visit (from the past 2160 hour(s)).  Dg Knee Right Port  Result Date: 05/07/2009 Clinical Data: Status post TKR  PORTABLE RIGHT KNEE - 1-2 VIEW  Comparison: None  Findings: The distal femoral, proximal tibial, and patellar prosthetic components appear in satisfactory position and alignment.  Vertical row of superficial staples anteriorly. Radiopaque drain in the suprapatellar space.  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 Provider: Morrell Riddle    ROS: 14 pt review of systems performed and negative (unless mentioned in an HPI)  Objective: BP 131/88 (BP Location: Right Arm, Patient Position: Sitting, Cuff Size: Large)   Pulse 66   Temp 98.2 F (36.8 C)   Resp 20   Ht 6' (1.829 m)   Wt 236 lb 8 oz (107.3 kg)   SpO2 97%   BMI 32.08 kg/m  Gen: Afebrile. No acute distress. Nontoxic in appearance, well-developed, well-nourished,  pleasant caucasian male.  HENT: AT. [place]. Bilateral TM visualized and normal in appearance, normal external auditory canal. MMM, no oral lesions, adequate dentition. Bilateral nares within normal limits. Throat without erythema, ulcerations or exudates.  No Cough on exam, no hoarseness on exam. Eyes:Pupils Equal Round Reactive to light, Extraocular movements intact,  Conjunctiva without redness, discharge or icterus. Neck/lymp/endocrine: Supple,no lymphadenopathy, no thyromegaly CV: RRR no murmur, no edema, +2/4 P posterior tibialis pulses. no carotid bruits. No JVD. Chest: CTAB, no wheeze, rhonchi or crackles. normal Respiratory effort. good Air movement. Abd: Soft. flat. NTND. BS present. no Masses palpated. No hepatosplenomegaly. No rebound tenderness or guarding. Skin: no rashes, purpura or petechiae. Warm and well-perfused. Skin intact. Neuro/Msk:  Normal gait. PERLA. EOMi. Alert. Oriented x3.  Cranial nerves II through XII intact. Muscle strength 5/5  upper/lower extremity. DTRs equal bilaterally. Mild tremor bilateral upper ext.  Psych: Normal affect, dress and demeanor. Normal speech. Normal thought content and judgment.   Assessment/plan: Henry Knight is a 65 y.o. male present for CPE Encounter for preventative adult health care examination Patient was encouraged to exercise greater than 150 minutes a week. Patient was encouraged to choose a diet filled with fresh fruits and vegetables, and lean meats. AVS provided to patient today for education/recommendation on gender specific health and safety maintenance. Colonoscopy: last screen 11/2014, recommend follow up 5 years; resulted 1 polyp. Completed by Dr. Benson Norway. Immunizations:  tdap UTD 2015, influenza admin today, PNA series prevnar 13 today- PSV23 08/2017, zostavax printed for patient.  Infectious disease screening:  Hep C completed PSA: normal 2015, repeat today  Prostate cancer screening - no urinary changes. Nocturia 1-2 x a  night. No fhx.  - PSA Essential hypertension, benign - stable today - CBC w/Diff - Comp Met (CMET) Hypertriglyceridemia - Lipid panel - takes fish oil 1000 mg, does not desire a statin medication. BMI 32.0-32.9,adult - Lipid panel - HgB A1c   Return in about 6 months (around 02/08/2017), or chronic medical conditions. Yearly for CPE and/or medical wellness (depending on insurance)  Electronically signed by: Howard Pouch, DO [place]

## 2016-08-12 ENCOUNTER — Telehealth: Payer: Self-pay | Admitting: Family Medicine

## 2016-08-12 DIAGNOSIS — E781 Pure hyperglyceridemia: Secondary | ICD-10-CM

## 2016-08-12 DIAGNOSIS — Z79899 Other long term (current) drug therapy: Secondary | ICD-10-CM | POA: Insufficient documentation

## 2016-08-12 MED ORDER — FENOFIBRATE 145 MG PO TABS: 145.0000 mg | ORAL_TABLET | Freq: Every day | ORAL | 1 refills | Status: DC

## 2016-08-12 NOTE — Telephone Encounter (Signed)
Please call pt:  - his labs are normal with the exception of his triglycerides are high (> 300) and his HDL or "good cholesterol" is low ( should be above 40). His records indicate he is taking 1000 mg of fish oil QD, I would like him to increase to 2000-3000 mg daily, increase fiber, increase the exercise to > 150 minutes a week.  The above modifications can help bring down triglycerides and increase the HDL.  - I would like to start him on fenofibrate to lower his triglycerides. This is a fiber based medication, not a statin (he has concerns over statins). I have called this in to his pharmacy for him to start. We will need to follow up with him 3 months after starting medication and repeat fasting labs. Future labs ordered if he would like to come in 2 days prior to provider appt to have labs drawn so they will be available to discuss.

## 2016-08-12 NOTE — Telephone Encounter (Signed)
Left message for patient to return call to review lab results and instructions. 

## 2016-08-13 NOTE — Telephone Encounter (Signed)
Spoke with patient reviewed lab results and instructions. Patient verbalized understanding. 

## 2016-08-20 DIAGNOSIS — M1712 Unilateral primary osteoarthritis, left knee: Secondary | ICD-10-CM | POA: Diagnosis not present

## 2016-10-13 ENCOUNTER — Ambulatory Visit (INDEPENDENT_AMBULATORY_CARE_PROVIDER_SITE_OTHER): Payer: Medicare Other | Admitting: Family Medicine

## 2016-10-13 ENCOUNTER — Encounter: Payer: Self-pay | Admitting: Family Medicine

## 2016-10-13 VITALS — BP 164/100 | Temp 97.9°F | Resp 20 | Wt 238.5 lb

## 2016-10-13 DIAGNOSIS — R030 Elevated blood-pressure reading, without diagnosis of hypertension: Secondary | ICD-10-CM

## 2016-10-13 DIAGNOSIS — R05 Cough: Secondary | ICD-10-CM

## 2016-10-13 DIAGNOSIS — J209 Acute bronchitis, unspecified: Secondary | ICD-10-CM | POA: Diagnosis not present

## 2016-10-13 DIAGNOSIS — R059 Cough, unspecified: Secondary | ICD-10-CM

## 2016-10-13 MED ORDER — DOXYCYCLINE HYCLATE 100 MG PO TABS: 100.0000 mg | ORAL_TABLET | Freq: Two times a day (BID) | ORAL | 0 refills | Status: DC

## 2016-10-13 MED ORDER — HYDROCODONE-HOMATROPINE 5-1.5 MG/5ML PO SYRP: 5.0000 mL | ORAL_SOLUTION | Freq: Three times a day (TID) | ORAL | 0 refills | Status: DC | PRN

## 2016-10-13 NOTE — Patient Instructions (Signed)
Start doxycyline 100 mg twice a day for 10 days. Complete antibiotics (what you already have and the new). Try Mucinex DM and humidifier in your room.  Use cough syrup for cough, caution with sedation.   Monitor BP. If elevated at followup we will need to discuss restarting a BP medication.   Avoid added salt and continue exercise.

## 2016-10-13 NOTE — Progress Notes (Signed)
Henry Knight , 03-13-1951, 66 y.o., male MRN: [id number] Patient Care Team    Relationship Specialty Notifications Start End  Ma Hillock, DO PCP - General Family Medicine  07/11/15   Carol Ada, MD Consulting Physician Gastroenterology  08/11/16   Gaynelle Arabian, MD Consulting Physician Orthopedic Surgery  08/11/16   Melida Quitter, MD Consulting Physician Otolaryngology  08/11/16   [place]    10/13/16    Comment[place] [facility] [place] [address] [place])    CC: congestion Subjective: Pt presents for an acute OV with complaints of nasal and chest congestion of 1 week duration. He states his cough is worsening and he is not sleeping at all. Painful cough secondary to prior cracked rib.  He denies, fever, chills, nausea, vomit or diarrhea. Pt has tried advil/tylenol robitussin cough and cold, nothing today to ease their symptoms.   Allergies  Allergen Reactions  . Penicillins    Social History  Substance Use Topics  . Smoking status: Never Smoker  . Smokeless tobacco: Never Used  . Alcohol use 4.8 oz/week    8 Glasses of wine per week   Past Medical History:  Diagnosis Date  . Arthritis   . BCC (basal cell carcinoma of skin)   . Diverticulosis 11/2014   Noted on 11/2014 colonoscopy: ascending, descending, and sigmoid  . Essential tremor   . History of adenomatous polyp of colon 2007; 2016   Recall 5 yrs (Dr. Benson Norway)  . HTN (hypertension)   . Melanoma ([place])    chest  . Neuromuscular disorder [facility])    Past Surgical History:  Procedure Laterality Date  . COLONOSCOPY W/ POLYPECTOMY  approx 2007; 11/2014   Dr. Marcy Panning 5 yrs   . COSMETIC SURGERY     laser peels, [facility])  . JOINT REPLACEMENT Right 2012  . KNEE SURGERY Right 2012  . SHOULDER ARTHROSCOPY W/ ROTATOR CUFF REPAIR  2004   Family History  Problem Relation Age of Onset  . Heart disease Father   . Alcohol abuse Father   . Obesity  Brother   . Asperger's syndrome Brother   . Obesity Mother   . Tremor Mother   . Anxiety disorder Mother    Allergies as of 10/13/2016      Reactions   Penicillins       Medication List       Accurate as of 10/13/16 12:10 PM. Always use your most recent med list.          doxycycline 100 MG tablet Commonly known as:  VIBRA-TABS Take 1 tablet (100 mg total) by mouth 2 (two) times daily.   ECHINACEA EXTRACT PO Take by mouth.   fenofibrate 145 MG tablet Commonly known as:  TRICOR Take 1 tablet (145 mg total) by mouth daily.   Fish Oil 1000 MG Caps Take 1 capsule by mouth daily.   HYDROcodone-homatropine 5-1.5 MG/5ML syrup Commonly known as:  HYCODAN Take 5 mLs by mouth every 8 (eight) hours as needed for cough.   ibuprofen 600 MG tablet Commonly known as:  ADVIL,MOTRIN   LORazepam 1 MG tablet Commonly known as:  ATIVAN Take 1 tablet (1 mg total) by mouth daily as needed for anxiety.   multivitamin capsule Take 1 capsule by mouth daily.   traMADol 50 MG tablet Commonly known as:  ULTRAM Take 1 tablet (50 mg total) by mouth at bedtime as needed.   TURMERIC PO Take  by mouth.       No results found for this or any previous visit (from the past 24 hour(s)). No results found.   ROS: Negative, with the exception of above mentioned in HPI   Objective:  BP (!) 164/100 (BP Location: Left Arm, Cuff Size: Large)   Temp 97.9 F (36.6 C)   Resp 20   Wt 238 lb 8 oz (108.2 kg)   SpO2 97%   BMI 32.35 kg/m  Body mass index is 32.35 kg/m. Gen: Afebrile. No acute distress. Nontoxic in appearance, well developed, well nourished.  HENT: AT. [place]. Bilateral TM visualized WNL (for him). MMM, no oral lesions. Bilateral nares with erythema and drianage. Throat without erythema or exudates. Hoarseness and cough present. Eyes:Pupils Equal Round Reactive to light, Extraocular movements intact,  Conjunctiva without redness, discharge or icterus. Neck/lymp/endocrine: Supple,  no lymphadenopathy CV: RRR Chest: CTAB, no wheeze or crackles. Good air movement, normal resp effort.   Assessment/Plan: Henry Knight is a 66 y.o. male present for acute OV for  Acute bronchitis, unspecified organism Cough - rest and hydrate, mucinex DM (avoid decongestants) - doxycyline BID for 10 days (enough called in to complete full therapy from the amount he already had) - HYDROcodone-homatropine (HYCODAN) 5-1.5 MG/5ML syrup; Take 5 mLs by mouth every 8 (eight) hours as needed for cough.  Dispense: 120 mL; Refill: 0 - F/U PRN, no refills on cough syrup.   Elevated BP without diagnosis of hypertension Pt had a prior h/o hypertension and currently not on medications. He is adamant he does not desire meds. He declined statin for his cholesterol as well, but agreed to tricor. Fhx heart disease in his father.  Review of BP readings at outside offices over last two months all have been above goal. Discussed dangers of uncontrolled HTN with pt today. He is ill, which may driving up blood pressure as well. He is asymptomatic. He has an appt for f/u on cholesterol coming up. Discussed monitoring BP in outpt setting and repeat on return visit, if still elevated would strongly encourage start of a blood pressure medicne and urine microalbumin.   electronically signed by:  Howard Pouch, DO  [place]

## 2016-11-04 DIAGNOSIS — M1712 Unilateral primary osteoarthritis, left knee: Secondary | ICD-10-CM | POA: Diagnosis not present

## 2016-11-10 ENCOUNTER — Other Ambulatory Visit (INDEPENDENT_AMBULATORY_CARE_PROVIDER_SITE_OTHER): Payer: Medicare Other

## 2016-11-10 DIAGNOSIS — Z79899 Other long term (current) drug therapy: Secondary | ICD-10-CM

## 2016-11-10 DIAGNOSIS — E781 Pure hyperglyceridemia: Secondary | ICD-10-CM | POA: Diagnosis not present

## 2016-11-10 LAB — LIPID PANEL
CHOL/HDL RATIO: 6
CHOLESTEROL: 221 mg/dL — AB (ref 0–200)
HDL: 36.3 mg/dL — AB (ref >39.00)
NonHDL: 184.61
Triglycerides: 211 mg/dL — ABNORMAL HIGH (ref 0.0–149.0)
VLDL: 42.2 mg/dL — AB (ref 0.0–40.0)

## 2016-11-10 LAB — HEPATIC FUNCTION PANEL
ALBUMIN: 4.6 g/dL (ref 3.5–5.2)
ALT: 21 U/L (ref 0–53)
AST: 25 U/L (ref 0–37)
Alkaline Phosphatase: 52 U/L (ref 39–117)
BILIRUBIN TOTAL: 0.6 mg/dL (ref 0.2–1.2)
Bilirubin, Direct: 0.1 mg/dL (ref 0.0–0.3)
Total Protein: 7.3 g/dL (ref 6.0–8.3)

## 2016-11-10 LAB — LDL CHOLESTEROL, DIRECT: Direct LDL: 126 mg/dL

## 2016-11-11 ENCOUNTER — Telehealth: Payer: Self-pay | Admitting: Family Medicine

## 2016-11-11 NOTE — Telephone Encounter (Signed)
Spoke with patient he states he has had a relapse of his URI . Explained to patient he would need an appt for evaluation of his symptoms. Scheduled patient to be seen on Friday

## 2016-11-11 NOTE — Telephone Encounter (Signed)
**  Remind patient they can make refill requests via MyChart**  Medication refill request (Name & Dosage): Doxycycline    Preferred pharmacy (Name & Address): [facility]    Other comments (if applicable):

## 2016-11-12 ENCOUNTER — Encounter: Payer: Self-pay | Admitting: Family Medicine

## 2016-11-12 ENCOUNTER — Ambulatory Visit (INDEPENDENT_AMBULATORY_CARE_PROVIDER_SITE_OTHER): Payer: Medicare Other | Admitting: Family Medicine

## 2016-11-12 VITALS — BP 148/96 | HR 62 | Temp 97.8°F | Resp 20 | Wt 234.0 lb

## 2016-11-12 DIAGNOSIS — Z9109 Other allergy status, other than to drugs and biological substances: Secondary | ICD-10-CM

## 2016-11-12 DIAGNOSIS — R058 Other specified cough: Secondary | ICD-10-CM

## 2016-11-12 DIAGNOSIS — R05 Cough: Secondary | ICD-10-CM | POA: Diagnosis not present

## 2016-11-12 NOTE — Patient Instructions (Signed)
Mucinex plain  or Mucinex DM (has robitussin in it also) for phlegm reduction and cough Supressant.  Zyrtec nightly for allergies. Take for at least a month straight.  Consider Flonase nasal spray as well to help with symptoms (for about 2-4 weeks).    Allergies An allergy is when your body reacts to a substance in a way that is not normal. An allergic reaction can happen after you:  Eat something.  Breathe in something.  Touch something. WHAT KINDS OF ALLERGIES ARE THERE? You can be allergic to:  Things that are only around during certain seasons, like molds and pollens.  Foods.  Drugs.  Insects.  Animal dander. WHAT ARE SYMPTOMS OF ALLERGIES?  Puffiness (swelling). This may happen on the lips, face, tongue, mouth, or throat.  Sneezing.  Coughing.  Breathing loudly (wheezing).  Stuffy nose.  Tingling in the mouth.  A rash.  Itching.  Itchy, red, puffy areas of skin (hives).  Watery eyes.  Throwing up (vomiting).  Watery poop (diarrhea).  Dizziness.  Feeling faint or fainting.  Trouble breathing or swallowing.  A tight feeling in the chest.  A fast heartbeat. HOW ARE ALLERGIES DIAGNOSED? Allergies can be diagnosed with:  A medical and family history.  Skin tests.  Blood tests.  A food diary. A food diary is a record of all the foods, drinks, and symptoms you have each day.  The results of an elimination diet. This diet involves making sure not to eat certain foods and then seeing what happens when you start eating them again. HOW ARE ALLERGIES TREATED? There is no cure for allergies, but allergic reactions can be treated with medicine. Severe reactions usually need to be treated at a hospital.  HOW CAN REACTIONS BE PREVENTED? The best way to prevent an allergic reaction is to avoid the thing you are allergic to. Allergy shots and medicines can also help prevent reactions in some cases. This information is not intended to replace advice  given to you by your health care provider. Make sure you discuss any questions you have with your health care provider. Document Released: 01/15/2013 Document Revised: 10/11/2014 Document Reviewed: 07/02/2014 Elsevier Interactive Patient Education  2017 Elsevier Inc.     Cough, Adult Introduction A cough helps to clear your throat and lungs. A cough may last only 2-3 weeks (acute), or it may last longer than 8 weeks (chronic). Many different things can cause a cough. A cough may be a sign of an illness or another medical condition. Follow these instructions at home:  Pay attention to any changes in your cough.  Take medicines only as told by your doctor.  If you were prescribed an antibiotic medicine, take it as told by your doctor. Do not stop taking it even if you start to feel better.  Talk with your doctor before you try using a cough medicine.  Drink enough fluid to keep your pee (urine) clear or pale yellow.  If the air is dry, use a cold steam vaporizer or humidifier in your home.  Stay away from things that make you cough at work or at home.  If your cough is worse at night, try using extra pillows to raise your head up higher while you sleep.  Do not smoke, and try not to be around smoke. If you need help quitting, ask your doctor.  Do not have caffeine.  Do not drink alcohol.  Rest as needed. Contact a doctor if:  You have new problems (symptoms).  You cough up yellow fluid (pus).  Your cough does not get better after 2-3 weeks, or your cough gets worse.  Medicine does not help your cough and you are not sleeping well.  You have pain that gets worse or pain that is not helped with medicine.  You have a fever.  You are losing weight and you do not know why.  You have night sweats. Get help right away if:  You cough up blood.  You have trouble breathing.  Your heartbeat is very fast. This information is not intended to replace advice given to you by  your health care provider. Make sure you discuss any questions you have with your health care provider. Document Released: 06/03/2011 Document Revised: 02/26/2016 Document Reviewed: 11/27/2014  2017 Elsevier

## 2016-11-12 NOTE — Progress Notes (Signed)
Henry Knight , [birth year], 66 y.o., male MRN: [id number] Patient Care Team    Relationship Specialty Notifications Start End  Ma Hillock, DO PCP - General Family Medicine  07/11/15   Carol Ada, MD Consulting Physician Gastroenterology  08/11/16   Gaynelle Arabian, MD Consulting Physician Orthopedic Surgery  08/11/16   Melida Quitter, MD Consulting Physician Otolaryngology  08/11/16   [place]    10/13/16    Comment[place] [facility] [place]ion [facility] [place] [address] [place])    CC: re-current sinus symptoms.  Subjective:   Cough: Pt presents today for re-current cough,congestion and drainage. He was treated for bronchitis about 1 month ago with doxycyline and reports improvement, but never full recovery. He reports his cough has remained, although improved. He is producing a minimal amount of clear phlegm with cough. He feels his sinus has improved. He denies fever, hoarseness, sore throat, sinus headache, nausea or vomit.   Prior note:  Pt presents for an acute OV with complaints of nasal and chest congestion of 1 week duration. He states his cough is worsening and he is not sleeping at all. Painful cough secondary to prior cracked rib.  He denies, fever, chills, nausea, vomit or diarrhea. Pt has tried advil/tylenol robitussin cough and cold, nothing today to ease their symptoms.   Allergies  Allergen Reactions  . Penicillins    Social History  Substance Use Topics  . Smoking status: Never Smoker  . Smokeless tobacco: Never Used  . Alcohol use 4.8 oz/week    8 Glasses of wine per week   Past Medical History:  Diagnosis Date  . Arthritis   . BCC (basal cell carcinoma of skin)   . Diverticulosis 11/2014   Noted on 11/2014 colonoscopy: ascending, descending, and sigmoid  . Essential tremor   . History of adenomatous polyp of colon 2007; 2016   Recall 5 yrs (Dr. Benson Norway)  . HTN (hypertension)   . Melanoma ([place])    chest  . Neuromuscular disorder [facility])    Past  Surgical History:  Procedure Laterality Date  . COLONOSCOPY W/ POLYPECTOMY  approx 2007; 11/2014   Dr. Marcy Panning 5 yrs   . COSMETIC SURGERY     laser peels, [facility] [facility] surgery [facility]) for acne scars  . JOINT REPLACEMENT Right 2012  . KNEE SURGERY Right 2012  . SHOULDER ARTHROSCOPY W/ ROTATOR CUFF REPAIR  2004   Family History  Problem Relation Age of Onset  . Heart disease Father   . Alcohol abuse Father   . Obesity Brother   . Asperger's syndrome Brother   . Obesity Mother   . Tremor Mother   . Anxiety disorder Mother    Allergies as of 11/12/2016      Reactions   Penicillins       Medication List       Accurate as of 11/12/16  9:32 AM. Always use your most recent med list.          ECHINACEA EXTRACT PO Take by mouth.   fenofibrate 145 MG tablet Commonly known as:  TRICOR Take 1 tablet (145 mg total) by mouth daily.   Fish Oil 1000 MG Caps Take 1 capsule by mouth daily.   ibuprofen 600 MG tablet Commonly known as:  ADVIL,MOTRIN   LORazepam 1 MG tablet Commonly known as:  ATIVAN Take 1 tablet (1 mg total) by mouth daily as needed for anxiety.   multivitamin capsule Take 1 capsule by  mouth daily.   traMADol 50 MG tablet Commonly known as:  ULTRAM Take 1 tablet (50 mg total) by mouth at bedtime as needed.   TURMERIC PO Take by mouth.       No results found for this or any previous visit (from the past 24 hour(s)). No results found.   ROS: Negative, with the exception of above mentioned in HPI   Objective:  BP (!) 148/96 (BP Location: Right Arm, Patient Position: Sitting, Cuff Size: Large)   Pulse 62   Temp 97.8 F (36.6 C)   Resp 20   Wt 234 lb (106.1 kg)   SpO2 98%   BMI 31.74 kg/m  Body mass index is 31.74 kg/m. Gen: Afebrile. No acute distress. Nontoxic in appearance. HENT: AT. [place]. Bilateral TM visualized and normal in appearance (osteoma bilateral). MMM. Bilateral nares with erythema, very mild swelling,  no drainage. Throat without erythema or exudates. Mild cough and PND present.  Eyes:Pupils Equal Round Reactive to light, Extraocular movements intact,  Conjunctiva without redness, discharge or icterus.  Neck/lymp/endocrine: Supple, no lymphadenopathy CV: RRR  Chest: CTAB, no wheeze or crackles    Assessment/Plan: Henry Knight is a 66 y.o. male present for acute OV for  Post-viral cough syndrome/Environmental allergies - Discussed with pt that he does not appear infectious today. His sinuses and voice are much improved. - his symptoms are likely from a post viral cough . - recommended mucinex (DM), Flonase and daily allergy medication (zyrtec/calritin etc).  - F/u PRN    electronically signed by:  Howard Pouch, DO  [place]

## 2016-11-17 ENCOUNTER — Ambulatory Visit (INDEPENDENT_AMBULATORY_CARE_PROVIDER_SITE_OTHER): Payer: Medicare Other | Admitting: Family Medicine

## 2016-11-17 ENCOUNTER — Encounter: Payer: Self-pay | Admitting: Family Medicine

## 2016-11-17 ENCOUNTER — Encounter: Payer: Self-pay | Admitting: *Deleted

## 2016-11-17 VITALS — BP 168/104 | HR 60 | Temp 97.6°F | Resp 20 | Ht 72.0 in | Wt 232.5 lb

## 2016-11-17 DIAGNOSIS — I10 Essential (primary) hypertension: Secondary | ICD-10-CM

## 2016-11-17 DIAGNOSIS — E781 Pure hyperglyceridemia: Secondary | ICD-10-CM

## 2016-11-17 DIAGNOSIS — M8949 Other hypertrophic osteoarthropathy, multiple sites: Secondary | ICD-10-CM

## 2016-11-17 DIAGNOSIS — M150 Primary generalized (osteo)arthritis: Secondary | ICD-10-CM | POA: Diagnosis not present

## 2016-11-17 DIAGNOSIS — M159 Polyosteoarthritis, unspecified: Secondary | ICD-10-CM

## 2016-11-17 DIAGNOSIS — G250 Essential tremor: Secondary | ICD-10-CM

## 2016-11-17 LAB — MICROALBUMIN / CREATININE URINE RATIO
CREATININE, U: 131.5 mg/dL
Microalb Creat Ratio: 0.5 mg/g (ref 0.0–30.0)

## 2016-11-17 MED ORDER — TRAMADOL HCL 50 MG PO TABS: 50.0000 mg | ORAL_TABLET | Freq: Every evening | ORAL | 2 refills | Status: DC | PRN

## 2016-11-17 MED ORDER — HYDROCHLOROTHIAZIDE 25 MG PO TABS: 25.0000 mg | ORAL_TABLET | Freq: Every day | ORAL | 3 refills | Status: DC

## 2016-11-17 MED ORDER — LORAZEPAM 1 MG PO TABS: 1.0000 mg | ORAL_TABLET | Freq: Every day | ORAL | 5 refills | Status: DC | PRN

## 2016-11-17 MED ORDER — FENOFIBRATE 145 MG PO TABS: 145.0000 mg | ORAL_TABLET | Freq: Every day | ORAL | 1 refills | Status: DC

## 2016-11-17 NOTE — Patient Instructions (Addendum)
Start HCTZ one tab daily in the morning. Followup with a nurse visit in 1-2 weeks after starting medicine to have BP rechecked.   Continue Diet and start cutting back a little more on carbohydrates/sugars.  Continue tricor and increase fish oil to 3-4 caps a day.    Food Choices to Lower Your Triglycerides Triglycerides are a type of fat in your blood. High levels of triglycerides can increase the risk of heart disease and stroke. If your triglyceride levels are high, the foods you eat and your eating habits are very important. Choosing the right foods can help lower your triglycerides. What general guidelines do I need to follow?  Lose weight if you are overweight.  Limit or avoid alcohol.  Fill one half of your plate with vegetables and green salads.  Limit fruit to two servings a day. Choose fruit instead of juice.  Make one fourth of your plate whole grains. Look for the word "whole" as the first word in the ingredient list.  Fill one fourth of your plate with lean protein foods.  Enjoy fatty fish (such as salmon, mackerel, sardines, and tuna) three times a week.  Choose healthy fats.  Limit foods high in starch and sugar.  Eat more home-cooked food and less restaurant, buffet, and fast food.  Limit fried foods.  Cook foods using methods other than frying.  Limit saturated fats.  Check ingredient lists to avoid foods with partially hydrogenated oils (trans fats) in them. What foods can I eat? Grains  Whole grains, such as whole wheat or whole grain breads, crackers, cereals, and pasta. Unsweetened oatmeal, bulgur, barley, quinoa, or brown rice. Corn or whole wheat flour tortillas. Vegetables  Fresh or frozen vegetables (raw, steamed, roasted, or grilled). Green salads. Fruits  All fresh, canned (in natural juice), or frozen fruits. Meat and Other Protein Products  Ground beef (85% or leaner), grass-fed beef, or beef trimmed of fat. Skinless chicken or [place]. Ground  chicken or [place]. Pork trimmed of fat. All fish and seafood. Eggs. Dried beans, peas, or lentils. Unsalted nuts or seeds. Unsalted canned or dry beans. Dairy  Low-fat dairy products, such as skim or 1% milk, 2% or reduced-fat cheeses, low-fat ricotta or cottage cheese, or plain low-fat yogurt. Fats and Oils  Tub margarines without trans fats. Light or reduced-fat mayonnaise and salad dressings. Avocado. Safflower, olive, or canola oils. Natural peanut or almond butter. The items listed above may not be a complete list of recommended foods or beverages. Contact your dietitian for more options.  What foods are not recommended? Grains  White bread. White pasta. White rice. Cornbread. Bagels, pastries, and croissants. Crackers that contain trans fat. Vegetables  White potatoes. Corn. Creamed or fried vegetables. Vegetables in a cheese sauce. Fruits  Dried fruits. Canned fruit in light or heavy syrup. Fruit juice. Meat and Other Protein Products  Fatty cuts of meat. Ribs, chicken wings, bacon, sausage, bologna, salami, chitterlings, fatback, hot dogs, bratwurst, and packaged luncheon meats. Dairy  Whole or 2% milk, cream, half-and-half, and cream cheese. Whole-fat or sweetened yogurt. Full-fat cheeses. Nondairy creamers and whipped toppings. Processed cheese, cheese spreads, or cheese curds. Sweets and Desserts  Corn syrup, sugars, honey, and molasses. Candy. Jam and jelly. Syrup. Sweetened cereals. Cookies, pies, cakes, donuts, muffins, and ice cream. Fats and Oils  Butter, stick margarine, lard, shortening, ghee, or bacon fat. Coconut, palm kernel, or palm oils. Beverages  Alcohol. Sweetened drinks (such as sodas, lemonade, and fruit drinks or punches). The items  listed above may not be a complete list of foods and beverages to avoid. Contact your dietitian for more information.  This information is not intended to replace advice given to you by your health care provider. Make sure you discuss  any questions you have with your health care provider. Document Released: 07/08/2004 Document Revised: 02/26/2016 Document Reviewed: 07/25/2013 Elsevier Interactive Patient Education  2017 [employer].

## 2016-11-17 NOTE — Progress Notes (Signed)
Henry Knight , Jul 01, 1951, 66 y.o., male MRN: [id number] Patient Care Team    Relationship Specialty Notifications Start End  Henry Hillock, DO PCP - General Family Medicine  07/11/15   Henry Ada, MD Consulting Physician Gastroenterology  08/11/16   Henry Arabian, MD Consulting Physician Orthopedic Surgery  08/11/16   Henry Quitter, MD Consulting Physician Otolaryngology  08/11/16   Henry Knight    10/13/16    Comment: [facility]ion [facility] ([place] [address] in [place])    CC: BP follow up Subjective:  Hypertension:  Pt has had multiple OV with borderline to above average BP readings. He is currently not on medications for his blood pressure, but he has been on HCTZ in the past. He exercises routinely, eats a low salt diet. He admits to carbs/sugary foods frequently.  Prior note:  Pt had a prior h/o hypertension and currently not on medications. He is adamant he does not desire meds. He declined statin for his cholesterol as well, but agreed to tricor. Fhx heart disease in his father.  Review of BP readings at outside offices over last two months all have been above goal. Discussed dangers of uncontrolled HTN with pt today. He is ill, which may driving up blood pressure as well. He is asymptomatic. He has an appt for f/u on cholesterol coming up. Discussed monitoring BP in outpt setting and repeat on return visit, if still elevated would strongly encourage start of a blood pressure medicne and urine microalbumin.   Hyperlipidemia:  Pt reports compliance with fenofibrate and fish oil 2g daily. He exercises routinely. He eats a low salt diet. He admits to not watching carbs.sugar as well as he should and has a sweet tooth. He has a family h/o heart disease. Lipid Panel     Component Value Date/Time   CHOL 221 (H) 11/10/2016 0828   TRIG 211.0 (H) 11/10/2016 0828   HDL 36.30 (L) 11/10/2016 0828   CHOLHDL 6 11/10/2016 0828   VLDL 42.2 (H) 11/10/2016 0828   LDLDIRECT  126.0 11/10/2016 0828    Arthritis/chronic pain: Pt report compliance with medication, tramadol 50 mg QD PRN. He does not use medication everyday. He reserves use for moderate to severe pain. He is seeing an orthopedic for his right knee arthritis.  Indication for chronic opioid: osteoarthiritis Medication and dose: Tramadol 50 Mg QD PRN # pills per month: 30 Last UDS date: never, does not use routinely enough to UDS Pain contract signed (Y/N): Y Date narcotic database last reviewed (include red flags): Y, no red flags  Essential tremor/anxiety: Pt has h/o essential tremor and is prescribed ativan 1 mg QD PRN. He has used the medication for years and reports no side effects from medicine.   Allergies  Allergen Reactions  . Penicillins    Social History  Substance Use Topics  . Smoking status: Never Smoker  . Smokeless tobacco: Never Used  . Alcohol use 4.8 oz/week    8 Glasses of wine per week   Past Medical History:  Diagnosis Date  . Arthritis   . BCC (basal cell carcinoma of skin)   . Diverticulosis 11/2014   Noted on 11/2014 colonoscopy: ascending, descending, and sigmoid  . Essential tremor   . History of adenomatous polyp of colon 2007; 2016   Recall 5 yrs (Dr. Benson Norway)  . HTN (hypertension)   . Melanoma ([place])    chest  . Neuromuscular disorder [facility])    Past Surgical History:  Procedure Laterality Date  . COLONOSCOPY W/ POLYPECTOMY  approx 2007; 11/2014   Dr. Marcy Panning 5 yrs   . COSMETIC SURGERY     laser peels, [facility]ive surgery [facility]) for acne scars  . JOINT REPLACEMENT Right 2012  . KNEE SURGERY Right 2012  . SHOULDER ARTHROSCOPY W/ ROTATOR CUFF REPAIR  2004   Family History  Problem Relation Age of Onset  . Heart disease Father   . Alcohol abuse Father   . Obesity Brother   . Asperger's syndrome Brother   . Obesity Mother   . Tremor Mother   . Anxiety disorder Mother    Allergies as of 11/17/2016      Reactions    Penicillins       Medication List       Accurate as of 11/17/16  8:48 AM. Always use your most recent med list.          ECHINACEA EXTRACT PO Take by mouth.   fenofibrate 145 MG tablet Commonly known as:  TRICOR Take 1 tablet (145 mg total) by mouth daily.   Fish Oil 1000 MG Caps Take 1 capsule by mouth daily.   ibuprofen 600 MG tablet Commonly known as:  ADVIL,MOTRIN   LORazepam 1 MG tablet Commonly known as:  ATIVAN Take 1 tablet (1 mg total) by mouth daily as needed for anxiety.   multivitamin capsule Take 1 capsule by mouth daily.   traMADol 50 MG tablet Commonly known as:  ULTRAM Take 1 tablet (50 mg total) by mouth at bedtime as needed.   TURMERIC PO Take by mouth.       No results found for this or any previous visit (from the past 24 hour(s)). No results found.   ROS: Negative, with the exception of above mentioned in HPI   Objective:  BP (!) 168/104 (BP Location: Left Arm, Patient Position: Sitting, Cuff Size: Large)   Pulse 60   Temp 97.6 F (36.4 C)   Resp 20   Ht 6' (1.829 m)   Wt 232 lb 8 oz (105.5 kg)   SpO2 99%   BMI 31.53 kg/m  Body mass index is 31.53 kg/m. Gen: Afebrile. No acute distress. Nontoxic in appearance. HENT: AT. [place]. Bilateral TM visualized and normal in appearance (osteoma bilateral). MMM. Bilateral nares with erythema, very mild swelling, no drainage. Throat without erythema or exudates. Mild cough and PND present.  Eyes:Pupils Equal Round Reactive to light, Extraocular movements intact,  Conjunctiva without redness, discharge or icterus.  Neck/lymp/endocrine: Supple, no lymphadenopathy CV: RRR  Chest: CTAB, no wheeze or crackles    Assessment/Plan: Henry Knight is a 66 y.o. male present for BP follow up  Essential hypertension/Hypertriglyceridemia - new - start hctz, nurse visit 1-2 weeks for nurse visit bp recheck - increase fish oil to 3-4 g daily. He is adamant against statin. Improvement in trig on  tricor, rest of panel worsened mildly.  - Diet and  exercise recommendations discussed.  - Urine Microalbumin w/creat. ratio - fenofibrate (TRICOR) 145 MG tablet; Take 1 tablet (145 mg total) by mouth daily.  Dispense: 90 tablet; Refill: 1 - hydrochlorothiazide (HYDRODIURIL) 25 MG tablet; Take 1 tablet (25 mg total) by mouth daily.  Dispense: 90 tablet; Refill: 3  Primary osteoarthritis involving multiple joints - Stable. - traMADol (ULTRAM) 50 MG tablet; Take 1 tablet (50 mg total) by mouth at bedtime as needed.  Dispense: 30 tablet; Refill: 2  Essential tremor - stable  - LORazepam (  ATIVAN) 1 MG tablet; Take 1 tablet (1 mg total) by mouth daily as needed for anxiety/tremor.  Dispense: 30 tablet; Refill: 5    electronically signed by:  Howard Pouch, DO  [place]

## 2016-11-18 ENCOUNTER — Telehealth: Payer: Self-pay | Admitting: Family Medicine

## 2016-11-18 NOTE — Telephone Encounter (Signed)
Detailed message left on voice mail. Okay per DPR. 

## 2016-11-18 NOTE — Telephone Encounter (Signed)
Please call pt: - his urine microalbumin was normal.

## 2016-11-26 ENCOUNTER — Ambulatory Visit (INDEPENDENT_AMBULATORY_CARE_PROVIDER_SITE_OTHER): Payer: Medicare Other | Admitting: Family Medicine

## 2016-11-26 VITALS — BP 124/78 | HR 63

## 2016-11-26 DIAGNOSIS — I10 Essential (primary) hypertension: Secondary | ICD-10-CM | POA: Diagnosis not present

## 2016-11-26 NOTE — Progress Notes (Signed)
Patient came to office today for nurse visit BP check.  Patient sat about 5 minutes in exam room prior to BP check.  BP was 124/78 w/ pulse of 63.

## 2016-11-26 NOTE — Progress Notes (Signed)
BP 124/78 (BP Location: Left Arm, Patient Position: Sitting, Cuff Size: Large)   Pulse 63  BP looks great.  F/u 3 months Medical screening examination/treatment/procedure(s) were performed by non-physician practitioner and as supervising physician I was immediately available for consultation/collaboration.  I agree with above assessment and plan.  Electronically Signed by: Howard Pouch, DO [facility] [place]

## 2016-11-26 NOTE — Addendum Note (Signed)
Addended by: Howard Pouch A on: 11/26/2016 11:20 AM   Modules accepted: Level of Service

## 2017-02-10 DIAGNOSIS — M1712 Unilateral primary osteoarthritis, left knee: Secondary | ICD-10-CM | POA: Diagnosis not present

## 2017-06-09 DIAGNOSIS — M1712 Unilateral primary osteoarthritis, left knee: Secondary | ICD-10-CM | POA: Diagnosis not present

## 2017-06-30 ENCOUNTER — Ambulatory Visit (INDEPENDENT_AMBULATORY_CARE_PROVIDER_SITE_OTHER): Payer: Medicare Other | Admitting: Family Medicine

## 2017-06-30 ENCOUNTER — Encounter: Payer: Self-pay | Admitting: Family Medicine

## 2017-06-30 VITALS — BP 150/99 | HR 61 | Temp 98.0°F | Resp 20 | Ht 72.0 in | Wt 235.2 lb

## 2017-06-30 DIAGNOSIS — G250 Essential tremor: Secondary | ICD-10-CM | POA: Diagnosis not present

## 2017-06-30 DIAGNOSIS — M150 Primary generalized (osteo)arthritis: Secondary | ICD-10-CM | POA: Diagnosis not present

## 2017-06-30 DIAGNOSIS — E781 Pure hyperglyceridemia: Secondary | ICD-10-CM

## 2017-06-30 DIAGNOSIS — M8949 Other hypertrophic osteoarthropathy, multiple sites: Secondary | ICD-10-CM

## 2017-06-30 DIAGNOSIS — F4010 Social phobia, unspecified: Secondary | ICD-10-CM | POA: Diagnosis not present

## 2017-06-30 DIAGNOSIS — M159 Polyosteoarthritis, unspecified: Secondary | ICD-10-CM

## 2017-06-30 DIAGNOSIS — I10 Essential (primary) hypertension: Secondary | ICD-10-CM | POA: Diagnosis not present

## 2017-06-30 MED ORDER — TRAMADOL HCL 50 MG PO TABS: 50.0000 mg | ORAL_TABLET | Freq: Every evening | ORAL | 5 refills | Status: DC | PRN

## 2017-06-30 MED ORDER — LORAZEPAM 1 MG PO TABS: 1.0000 mg | ORAL_TABLET | Freq: Every day | ORAL | 5 refills | Status: DC | PRN

## 2017-06-30 NOTE — Patient Instructions (Signed)
I have refilled your tramadol and ativan today (control substances). You will need to follow up every 6 months for these issues alone, for refills.    Restart your BP meds.    If abdomen pain does not resolve please be seen for this issue.    Please help [place] help you:  We are honored you have chosen [facility] for your Primary Care home. Below you will find basic instructions that you may need to access in the future. Please help [place] help you by reading the instructions, which cover many of the frequent questions we experience.   Prescription refills and request:  -In order to allow more efficient response time, please call your pharmacy for all refills. They will forward the request electronically to [place]. This allows for the quickest possible response. Request left on a nurse line can take longer to refill, since these are checked as time allows between office patients and other phone calls.  - refill request can take up to 3-5 working days to complete.  - If request is sent electronically and request is appropiate, it is usually completed in 1-2 business days.  - all patients will need to be seen routinely for all chronic medical conditions requiring prescription medications (see follow-up below). If you are overdue for follow up on your condition, you will be asked to make an appointment and we will call in enough medication to cover you until your appointment (up to 30 days).  - all controlled substances will require a face to face visit to request/refill.  - if you desire your prescriptions to go through a new pharmacy, and have an active script at original pharmacy, you will need to call your pharmacy and have scripts transferred to new pharmacy. This is completed between the pharmacy locations and not by your provider.    Results: If any images or labs were ordered, it can take up to 1 week to get results depending on the test ordered and the lab/facility running and resulting the  test. - Normal or stable results, which do not need further discussion, may be released to your mychart immediately with attached note to you. A call may not be generated for normal results. Please make certain to sign up for mychart. If you have questions on how to activate your mychart you can call the front office.  - If your results need further discussion, our office will attempt to contact you via phone, and if unable to reach you after 2 attempts, we will release your abnormal result to your mychart with instructions.  - All results will be automatically released in mychart after 1 week.  - Your provider will provide you with explanation and instruction on all relevant material in your results. Please keep in mind, results and labs may appear confusing or abnormal to the untrained eye, but it does not mean they are actually abnormal for you personally. If you have any questions about your results that are not covered, or you desire more detailed explanation than what was provided, you should make an appointment with your provider to do so.   Our office handles many outgoing and incoming calls daily. If we have not contacted you within 1 week about your results, please check your mychart to see if there is a message first and if not, then contact our office.  In helping with this matter, you help decrease call volume, and therefore allow [place] to be able to respond to patients needs more  efficiently.   Acute office visits (sick visit):  An acute visit is intended for a new problem and are scheduled in shorter time slots to allow schedule openings for patients with new problems. This is the appropriate visit to discuss a new problem. In order to provide you with excellent quality medical care with proper time for you to explain your problem, have an exam and receive treatment with instructions, these appointments should be limited to one new problem per visit. If you experience a new problem, in which you  desire to be addressed, please make an acute office visit, we save openings on the schedule to accommodate you. Please do not save your new problem for any other type of visit, let us take care of it properly and quickly for you.   Follow up visits:  Depending on your condition(s) your provider will need to see you routinely in order to provide you with quality care and prescribe medication(s). Most chronic conditions (Example: hypertension, Diabetes, depression/anxiety... etc), require visits a couple times a year. Your provider will instruct you on proper follow up for your personal medical conditions and history. Please make certain to make follow up appointments for your condition as instructed. Failing to do so could result in lapse in your medication treatment/refills. If you request a refill, and are overdue to be seen on a condition, we will always provide you with a 30 day script (once) to allow you time to schedule.    Medicare wellness (well visit): - we have a wonderful Nurse Maudie Mercury), that will meet with you and provide you will yearly medicare wellness visits. These visits should occur yearly (can not be scheduled less than 1 calendar year apart) and cover preventive health, immunizations, advance directives and screenings you are entitled to yearly through your medicare benefits. Do not miss out on your entitled benefits, this is when medicare will pay for these benefits to be ordered for you.  These are strongly encouraged by your provider and is the appropriate type of visit to make certain you are up to date with all preventive health benefits. If you have not had your medicare wellness exam in the last 12 months, please make certain to schedule one by calling the office and schedule your medicare wellness with Maudie Mercury as soon as possible.   Yearly physical (well visit):  - Adults are recommended to be seen yearly for physicals. Check with your insurance and date of your last physical, most  insurances require one calendar year between physicals. Physicals include all preventive health topics, screenings, medical exam and labs that are appropriate for gender/age and history. You may have fasting labs needed at this visit. This is a well visit (not a sick visit), new problems should not be covered during this visit (see acute visit).  - Pediatric patients are seen more frequently when they are younger. Your provider will advise you on well child visit timing that is appropriate for your their age. - This is not a medicare wellness visit. Medicare wellness exams do not have an exam portion to the visit. Some medicare companies allow for a physical, some do not allow a yearly physical. If your medicare allows a yearly physical you can schedule the medicare wellness with our nurse Maudie Mercury and have your physical with your provider after, on the same day. Please check with insurance for your full benefits.   Late Policy/No Shows:  - all new patients should arrive 15-30 minutes earlier than appointment to allow [place]  time  to  obtain all personal demographics,  insurance information and for you to complete office paperwork. - All established patients should arrive 10-15 minutes earlier than appointment time to update all information and be checked in .  - In our best efforts to run on time, if you are late for your appointment you will be asked to either reschedule or if able, we will work you back into the schedule. There will be a wait time to work you back in the schedule,  depending on availability.  - If you are unable to make it to your appointment as scheduled, please call 24 hours ahead of time to allow [place] to fill the time slot with someone else who needs to be seen. If you do not cancel your appointment ahead of time, you may be charged a no show fee.

## 2017-06-30 NOTE — Progress Notes (Signed)
Henry Knight , July 29, 1951, 66 y.o., male MRN: [id number] Patient Care Team    Relationship Specialty Notifications Start End  Ma Hillock, DO PCP - General Family Medicine  07/11/15   Carol Ada, MD Consulting Physician Gastroenterology  08/11/16   Gaynelle Arabian, MD Consulting Physician Orthopedic Surgery  08/11/16   Melida Quitter, MD Consulting Physician Otolaryngology  08/11/16   [facility]    10/13/16    Comment: [facility]ion [facility] (on [name] in [place])    Chief Complaint  Patient presents with  . Anxiety    medication refill  . Muscle Pain    lower abdomen    Subjective:  Hypertension/HLD:  Pt has had multiple OV with borderline to above average BP readings. He agreed to HCTZ start, he took for a little while, but quite taking it over the summer. He denies chest pain, shortness of breath, dizziness or LE edema. He is hoping to have surgery in January on his knee. He feels that will help him get back into exercise. He stopped taking fenofibrate and fish oil as well. He refuses a statin. He has a fhx heart disease.   Prior note:  Pt had a prior h/o hypertension and currently not on medications. He is adamant he does not desire meds. He declined statin for his cholesterol as well, but agreed to tricor. Fhx heart disease in his father.  Review of BP readings at outside offices over last two months all have been above goal. Discussed dangers of uncontrolled HTN with pt today. He is ill, which may driving up blood pressure as well. He is asymptomatic. He has an appt for f/u on cholesterol coming up. Discussed monitoring BP in outpt setting and repeat on return visit, if still elevated would strongly encourage start of a blood pressure medicne and urine microalbumin.   Arthritis/chronic pain: pt reports compliance with tramadol and ativan today. He is seeing an orthopedic for his right knee arthritis and plans to have surgery this January.    Indication for chronic opioid: osteoarthiritis Medication and dose: Tramadol 50 Mg QD PRN for pain  # pills per month: 30 Last UDS date: never, does not use routinely enough to UDS Pain contract signed (Y/N): Y Date narcotic database last reviewed (include red flags): Y, 06/30/2017  Essential tremor/anxiety: Pt has h/o essential tremor and is prescribed ativan 1 mg QD PRN. He has used the medication for years and reports no side effects from medicine. He does need refills today on his ativan.   Allergies  Allergen Reactions  . Penicillins    Social History  Substance Use Topics  . Smoking status: Never Smoker  . Smokeless tobacco: Never Used  . Alcohol use 4.8 oz/week    8 Glasses of wine per week   Past Medical History:  Diagnosis Date  . Arthritis   . BCC (basal cell carcinoma of skin)   . Diverticulosis 11/2014   Noted on 11/2014 colonoscopy: ascending, descending, and sigmoid  . Essential tremor   . History of adenomatous polyp of colon 2007; 2016   Recall 5 yrs (Dr. Benson Norway)  . HTN (hypertension)   . Melanoma ([place])    chest  . Neuromuscular disorder [facility])    Past Surgical History:  Procedure Laterality Date  . COLONOSCOPY W/ POLYPECTOMY  approx 2007; 11/2014   Dr. Marcy Panning 5 yrs   . COSMETIC SURGERY     laser peels, [place] [facility]astic and reconstructive surgery [facility]ek)  for acne scars  . JOINT REPLACEMENT Right 2012  . KNEE SURGERY Right 2012  . SHOULDER ARTHROSCOPY W/ ROTATOR CUFF REPAIR  2004   Family History  Problem Relation Age of Onset  . Heart disease Father   . Alcohol abuse Father   . Obesity Brother   . Asperger's syndrome Brother   . Obesity Mother   . Tremor Mother   . Anxiety disorder Mother    Allergies as of 06/30/2017      Reactions   Penicillins       Medication List       Accurate as of 06/30/17 10:37 AM. Always use your most recent med list.          fenofibrate 145 MG tablet Commonly known as:  TRICOR Take 1 tablet (145 mg  total) by mouth daily.   Fish Oil 1000 MG Caps Take 3-4 capsules by mouth daily.   hydrochlorothiazide 25 MG tablet Commonly known as:  HYDRODIURIL Take 1 tablet (25 mg total) by mouth daily.   ibuprofen 600 MG tablet Commonly known as:  ADVIL,MOTRIN   LORazepam 1 MG tablet Commonly known as:  ATIVAN Take 1 tablet (1 mg total) by mouth daily as needed for anxiety.   multivitamin capsule Take 1 capsule by mouth daily.   traMADol 50 MG tablet Commonly known as:  ULTRAM Take 1 tablet (50 mg total) by mouth at bedtime as needed.       No results found for this or any previous visit (from the past 24 hour(s)). No results found.   ROS: Negative, with the exception of above mentioned in HPI   Objective:  BP (!) 150/99 (BP Location: Right Arm, Patient Position: Sitting, Cuff Size: Large)   Pulse 61   Temp 98 F (36.7 C)   Resp 20   Ht 6' (1.829 m)   Wt 235 lb 4 oz (106.7 kg)   SpO2 97%   BMI 31.91 kg/m  Body mass index is 31.91 kg/m. Gen: Afebrile. No acute distress. Nontoxic in appearance.  HENT: AT. [place]. . MMM.  Eyes:Pupils Equal Round Reactive to light, Extraocular movements intact,  Conjunctiva without redness, discharge or icterus. CV: RRR no murmur, no edema, +2/4 P posterior tibialis pulses Chest: CTAB, no wheeze or crackles MSK: no erythema, no soft tissue swelling. LE varicose veins present. Decreased ROM left LE. NV intact distally.  Neuro:  Normal gait. PERLA. EOMi. Alert. Oriented x3. Essential tremor present bilateral UE.  Psych: Normal affect, dress and demeanor. Normal speech. Normal thought content and judgment.    Assessment/Plan: GERVIS GABA is a 66 y.o. male present for BP follow up  Essential hypertension/Hypertriglyceridemia - Not at goal. pt has not taken his medicine for either condition in quite sometime. He is agreeable to restart.  - strongly encouraged to restart both medications. Discussed damage to small vessels etc. (clearance  for upcoming surgery etc)  - he had a good response to fenofibrate, but stopped - He has refused a statin.  - Diet and  exercise recommendations discussed.  -Encouraged continue  fenofibrate  -Encouraged to continue  hydrochlorothiazide (HYDRODIURIL) 25 MG tablet F/U 6 months  Primary osteoarthritis involving multiple joints - stable - We will continue the opioid/controlled substance monitoring program, this consists of regular clinic visits, examinations, urine drug screen as well as use of [place] Controlled Substance Reporting System (which is made part of the permanent chart). Patient is aware and in agreement to routine face-to-face visits, by this  provider only, for all evaluations and refills for current prescribe controlled substance.  - Continue traMADol (ULTRAM) 50 MG tablet; Take 1 tablet (50 mg total) by mouth at bedtime as needed.  Dispense: 30 tablet; Refill: 5  Essential tremor/anxiety - stable  - Continue LORazepam (ATIVAN) 1 MG tablet; Take 1 tablet (1 mg total) by mouth daily as needed for anxiety/tremor.  Dispense: 30 tablet; Refill: 5 We will continue the  Controlled substance monitoring program, this consists of regular clinic visits, examinations, urine drug screen, pill counts as well as use of [place] Controlled Substance Reporting System (which is made part of the permanent chart). Patient is aware and in agreement to routine face-to-face visits, by this provider only, for all evaluations and refills for current prescribe controlled substance.   F/U 6 months  * pt mentioned intermittent lower ABD pain at the end of his visit. Briefly discussed alarms signs, which he did not endorse. He was advised to make an appt for abd pain if he wanted to be evaluated for that issue. Issue was not able to be addressed today for time constraints given this is his chronic condition appt and protocol states controlled substance appts need to be for that issue only to be  addressed.   electronically signed by:  Howard Pouch, DO  [place]

## 2017-07-27 DIAGNOSIS — D492 Neoplasm of unspecified behavior of bone, soft tissue, and skin: Secondary | ICD-10-CM | POA: Diagnosis not present

## 2017-07-27 DIAGNOSIS — D0460 Carcinoma in situ of skin of unspecified upper limb, including shoulder: Secondary | ICD-10-CM | POA: Diagnosis not present

## 2017-07-27 DIAGNOSIS — D229 Melanocytic nevi, unspecified: Secondary | ICD-10-CM | POA: Diagnosis not present

## 2017-07-27 DIAGNOSIS — L570 Actinic keratosis: Secondary | ICD-10-CM | POA: Diagnosis not present

## 2017-07-27 DIAGNOSIS — D0461 Carcinoma in situ of skin of right upper limb, including shoulder: Secondary | ICD-10-CM | POA: Diagnosis not present

## 2017-07-29 ENCOUNTER — Ambulatory Visit (INDEPENDENT_AMBULATORY_CARE_PROVIDER_SITE_OTHER): Payer: Medicare Other

## 2017-07-29 DIAGNOSIS — Z23 Encounter for immunization: Secondary | ICD-10-CM | POA: Diagnosis not present

## 2017-08-16 ENCOUNTER — Telehealth: Payer: Self-pay | Admitting: Family Medicine

## 2017-08-17 NOTE — Telephone Encounter (Signed)
Note in error.

## 2017-09-01 ENCOUNTER — Encounter: Payer: Self-pay | Admitting: Family Medicine

## 2017-09-01 ENCOUNTER — Ambulatory Visit: Payer: Medicare Other | Admitting: Family Medicine

## 2017-09-01 VITALS — BP 143/92 | HR 62 | Temp 97.9°F | Resp 20 | Ht 72.0 in | Wt 234.5 lb

## 2017-09-01 DIAGNOSIS — D0461 Carcinoma in situ of skin of right upper limb, including shoulder: Secondary | ICD-10-CM | POA: Diagnosis not present

## 2017-09-01 DIAGNOSIS — E781 Pure hyperglyceridemia: Secondary | ICD-10-CM

## 2017-09-01 DIAGNOSIS — I10 Essential (primary) hypertension: Secondary | ICD-10-CM | POA: Diagnosis not present

## 2017-09-01 DIAGNOSIS — E669 Obesity, unspecified: Secondary | ICD-10-CM | POA: Diagnosis not present

## 2017-09-01 DIAGNOSIS — Z79899 Other long term (current) drug therapy: Secondary | ICD-10-CM | POA: Diagnosis not present

## 2017-09-01 DIAGNOSIS — Z01818 Encounter for other preprocedural examination: Secondary | ICD-10-CM

## 2017-09-01 LAB — CBC WITH DIFFERENTIAL/PLATELET
BASOS PCT: 0.6 % (ref 0.0–3.0)
Basophils Absolute: 0 10*3/uL (ref 0.0–0.1)
EOS PCT: 1.8 % (ref 0.0–5.0)
Eosinophils Absolute: 0.1 10*3/uL (ref 0.0–0.7)
HEMATOCRIT: 45.9 % (ref 39.0–52.0)
HEMOGLOBIN: 15.6 g/dL (ref 13.0–17.0)
Lymphocytes Relative: 43 % (ref 12.0–46.0)
Lymphs Abs: 1.9 10*3/uL (ref 0.7–4.0)
MCHC: 34 g/dL (ref 30.0–36.0)
MCV: 96.1 fl (ref 78.0–100.0)
MONO ABS: 0.5 10*3/uL (ref 0.1–1.0)
Monocytes Relative: 10.6 % (ref 3.0–12.0)
NEUTROS ABS: 1.9 10*3/uL (ref 1.4–7.7)
Neutrophils Relative %: 44 % (ref 43.0–77.0)
PLATELETS: 214 10*3/uL (ref 150.0–400.0)
RBC: 4.78 Mil/uL (ref 4.22–5.81)
RDW: 12.4 % (ref 11.5–15.5)
WBC: 4.4 10*3/uL (ref 4.0–10.5)

## 2017-09-01 LAB — COMPREHENSIVE METABOLIC PANEL
ALBUMIN: 4.5 g/dL (ref 3.5–5.2)
ALT: 22 U/L (ref 0–53)
AST: 21 U/L (ref 0–37)
Alkaline Phosphatase: 65 U/L (ref 39–117)
BUN: 19 mg/dL (ref 6–23)
CALCIUM: 9.5 mg/dL (ref 8.4–10.5)
CHLORIDE: 104 meq/L (ref 96–112)
CO2: 27 meq/L (ref 19–32)
Creatinine, Ser: 0.91 mg/dL (ref 0.40–1.50)
GFR: 88.13 mL/min (ref >60.00)
Glucose, Bld: 99 mg/dL (ref 70–99)
POTASSIUM: 4.5 meq/L (ref 3.5–5.1)
SODIUM: 138 meq/L (ref 135–145)
Total Bilirubin: 0.9 mg/dL (ref 0.2–1.2)
Total Protein: 7 g/dL (ref 6.0–8.3)

## 2017-09-01 LAB — HEMOGLOBIN A1C: HEMOGLOBIN A1C: 5.4 % (ref 4.6–6.5)

## 2017-09-01 MED ORDER — HYDROCHLOROTHIAZIDE 50 MG PO TABS: 50.0000 mg | ORAL_TABLET | Freq: Every day | ORAL | 1 refills | Status: DC

## 2017-09-01 NOTE — Patient Instructions (Addendum)
I will complete your forms after labs received.  They will need your blood work completed in order to fill out your form appropriately.   Increase your HCTZ to 50 mg a day. Finish what you have by taking 2 of the 25 mg tabs a day until finished. The new dose/bottle will be 50 mg a pill, then take one.   Good luck on your total knee.

## 2017-09-01 NOTE — Progress Notes (Signed)
  Henry Knight , 12/15/1950, 66 y.o., male MRN: [id number] Patient Care Team    Relationship Specialty Notifications Start End  Kuneff, Renee A, DO PCP - General Family Medicine  07/11/15   Hung, Patrick, MD Consulting Physician Gastroenterology  08/11/16   Aluisio, Frank, MD Consulting Physician Orthopedic Surgery  08/11/16   Bates, Dwight, MD Consulting Physician Otolaryngology  08/11/16   Health, [facility]    10/13/16    Comment: plastic and reconstructiion [place] ([address] in [place])  Tafeen, Stuart, MD Consulting Physician Dermatology  06/30/17     Chief Complaint  Patient presents with  . surgical clearance    left knee replacement      Subjective: Pt presents for surgical clearance. He is to have a left total knee replacement on October 10, 2017 by [facility] Dr. Aluisio.  Significant medical history: uncontrolled HTN, hypertriglyceridemia, anxiety with chronic  benzodiazapine use and obesity. Patient has been noncompliant with blood pressure medications in the past. He reports he recently restarted his hydrochlorothiazide 25 mg daily this past week. He denies any chest pain, shortness of breath, exertional dyspnea, dizziness or lower extremity edema. He has refused cholesterol medication, is taking fish oil 3000 4000 mg a day. He is a nonsmoker. Patient is prescribed Ativan 1 mg daily when necessary for anxiety and tremor. Patient has been on this medication long-term, would not discontinue it abruptly. Recent Results (from the past 2160 hour(s))  CBC w/Diff     Status: None   Collection Time: 09/01/17 10:35 AM  Result Value Ref Range   WBC 4.4 4.0 - 10.5 K/uL   RBC 4.78 4.22 - 5.81 Mil/uL   Hemoglobin 15.6 13.0 - 17.0 g/dL   HCT 45.9 39.0 - 52.0 %   MCV 96.1 78.0 - 100.0 fl   MCHC 34.0 30.0 - 36.0 g/dL   RDW 12.4 11.5 - 15.5 %   Platelets 214.0 150.0 - 400.0 K/uL   Neutrophils Relative % 44.0 43.0 - 77.0 %   Lymphocytes Relative 43.0 12.0 - 46.0 %   Monocytes Relative 10.6 3.0 - 12.0 %   Eosinophils Relative 1.8 0.0 - 5.0 %   Basophils Relative 0.6 0.0 - 3.0 %   Neutro Abs 1.9 1.4 - 7.7 K/uL   Lymphs Abs 1.9 0.7 - 4.0 K/uL   Monocytes Absolute 0.5 0.1 - 1.0 K/uL   Eosinophils Absolute 0.1 0.0 - 0.7 K/uL   Basophils Absolute 0.0 0.0 - 0.1 K/uL  Comp Met (CMET)     Status: None   Collection Time: 09/01/17 10:35 AM  Result Value Ref Range   Sodium 138 135 - 145 mEq/L   Potassium 4.5 3.5 - 5.1 mEq/L   Chloride 104 96 - 112 mEq/L   CO2 27 19 - 32 mEq/L   Glucose, Bld 99 70 - 99 mg/dL   BUN 19 6 - 23 mg/dL   Creatinine, Ser 0.91 0.40 - 1.50 mg/dL   Total Bilirubin 0.9 0.2 - 1.2 mg/dL   Alkaline Phosphatase 65 39 - 117 U/L   AST 21 0 - 37 U/L   ALT 22 0 - 53 U/L   Total Protein 7.0 6.0 - 8.3 g/dL   Albumin 4.5 3.5 - 5.2 g/dL   Calcium 9.5 8.4 - 10.5 mg/dL   GFR 88.13 >60.00 mL/min  HgB A1c     Status: None   Collection Time: 09/01/17 10:35 AM  Result Value Ref Range   Hgb A1c MFr Bld 5.4   4.6 - 6.5 %    Comment: Glycemic Control Guidelines for People with Diabetes:Non Diabetic:  <6%Goal of Therapy: <7%Additional Action Suggested:  >8%      Depression screen PHQ 2/9 08/11/2016 11/04/2015 11/04/2015  Decreased Interest 0 0 0  Down, Depressed, Hopeless 0 0 0  PHQ - 2 Score 0 0 0    Allergies  Allergen Reactions  . Penicillins    Social History   Tobacco Use  . Smoking status: Never Smoker  . Smokeless tobacco: Never Used  Substance Use Topics  . Alcohol use: Yes    Alcohol/week: 4.8 oz    Types: 8 Glasses of wine per week   Past Medical History:  Diagnosis Date  . Arthritis   . BCC (basal cell carcinoma of skin)   . Diverticulosis 11/2014   Noted on 11/2014 colonoscopy: ascending, descending, and sigmoid  . Essential tremor   . History of adenomatous polyp of colon 2007; 2016   Recall 5 yrs (Dr. Hung)  . HTN (hypertension)   . Melanoma (HCC)    chest  . Neuromuscular disorder (HCC)    Past Surgical History:    Procedure Laterality Date  . COLONOSCOPY W/ POLYPECTOMY  approx 2007; 11/2014   Dr. Hung--recall 5 yrs   . COSMETIC SURGERY     laser peels, [facility] Plastic and reconstructive surgery (Deborah Wagoner) for acne scars  . KNEE SURGERY Right 2012  . SHOULDER ARTHROSCOPY W/ ROTATOR CUFF REPAIR  2004   Family History  Problem Relation Age of Onset  . Heart disease Father   . Alcohol abuse Father   . Obesity Brother   . Asperger's syndrome Brother   . Obesity Mother   . Tremor Mother   . Anxiety disorder Mother    Allergies as of 09/01/2017      Reactions   Penicillins       Medication List        Accurate as of 09/01/17 11:59 PM. Always use your most recent med list.          Fish Oil 1000 MG Caps Take 3-4 capsules by mouth daily.   hydrochlorothiazide 50 MG tablet Commonly known as:  HYDRODIURIL Take 1 tablet (50 mg total) by mouth daily.   ibuprofen 600 MG tablet Commonly known as:  ADVIL,MOTRIN   LORazepam 1 MG tablet Commonly known as:  ATIVAN Take 1 tablet (1 mg total) by mouth daily as needed for anxiety.   multivitamin capsule Take 1 capsule by mouth daily.   traMADol 50 MG tablet Commonly known as:  ULTRAM Take 1 tablet (50 mg total) by mouth at bedtime as needed.   TURMERIC PO Take by mouth.       All past medical history, surgical history, allergies, family history, immunizations andmedications were updated in the EMR today and reviewed under the history and medication portions of their EMR.     ROS: Negative, with the exception of above mentioned in HPI   Objective:  BP (!) 143/92 (BP Location: Left Arm, Patient Position: Sitting, Cuff Size: Large)   Pulse 62   Temp 97.9 F (36.6 C)   Resp 20   Ht 6' (1.829 m)   Wt 234 lb 8 oz (106.4 kg)   SpO2 97%   BMI 31.80 kg/m  Body mass index is 31.8 kg/m. Gen: Afebrile. No acute distress. Nontoxic in appearance, well developed, well nourished. Pleasant Caucasian male. HENT: AT. [place].  MMM, no oral  lesions. Bilateral nares without erythema or   swelling. Throat without erythema or exudates. No cough, no hoarseness. Eyes:Pupils Equal Round Reactive to light, Extraocular movements intact,  Conjunctiva without redness, discharge or icterus. Neck/lymp/endocrine: Supple, no lymphadenopathy CV: RRR no murmur, no edema Chest: CTAB, no wheeze or crackles. Good air movement, normal resp effort.  Abd: Soft. Obese. NTND. BS present. No Masses palpated. No rebound or guarding.  Skin: No rashes, purpura or petechiae.  Neuro:  Normal gait. PERLA. EOMi. Alert. Oriented x3   Psych: Normal affect, dress and demeanor. Normal speech. Normal thought content and judgment.  No exam data present No results found. No results found for this or any previous visit (from the past 24 hour(s)).  Assessment/Plan: Henry Knight is a 66 y.o. male present for OV for surgical clearance. Discussed with patient surgical clearance, does not mean with out any risk. He voiced understanding. Essential hypertension/hypertriglyceridemia Patient has been noncompliant in the past with medications concerning his hypertension and hyperlipidemia. Increased HCTZ to 50 mg daily today. Patient was encouraged to take medication daily, optimal blood pressure control is recommended. - Continue fish oil supplementation - CBC w/Diff>> normal, no signs of anemia or infection. Platelets normal. - Comp Met (CMET)>> normal liver and kidney function. - HgB A1c>> normal, no diabetes. - hydrochlorothiazide (HYDRODIURIL) 50 MG tablet; Take 1 tablet (50 mg total) by mouth daily.  Dispense: 90 tablet; Refill: 1  Preoperative clearance - Patient's risks include hypertension, hyperlipidemia, obesity and chronic benzodiazepine use. Encouraged patient to have optimal control his blood pressure by taking medications daily. Hopefully after knee surgery, he will be able to get back to a exercise regimen to decrease his weight. Patient is taking Ativan  daily. - fax over office visit and recent labs to orthopedics.   Reviewed expectations re: course of current medical issues.  Discussed self-management of symptoms.  Outlined signs and symptoms indicating need for more acute intervention.  Patient verbalized understanding and all questions were answered.  Patient received an After-Visit Summary.    Orders Placed This Encounter  Procedures  . CBC w/Diff  . Comp Met (CMET)  . HgB A1c     Note is dictated utilizing voice recognition software. Although note has been proof read prior to signing, occasional typographical errors still can be missed. If any questions arise, please do not hesitate to call for verification.   electronically signed by:  Renee Kuneff, DO  [facility] - [place]     

## 2017-09-05 ENCOUNTER — Encounter: Payer: Self-pay | Admitting: Family Medicine

## 2017-09-05 DIAGNOSIS — Z79899 Other long term (current) drug therapy: Secondary | ICD-10-CM | POA: Insufficient documentation

## 2017-09-05 DIAGNOSIS — E669 Obesity, unspecified: Secondary | ICD-10-CM | POA: Insufficient documentation

## 2017-09-11 ENCOUNTER — Ambulatory Visit: Payer: Self-pay | Admitting: Orthopedic Surgery

## 2017-09-16 ENCOUNTER — Ambulatory Visit: Payer: Self-pay | Admitting: Orthopedic Surgery

## 2017-09-16 NOTE — H&P (Signed)
Marcelle "Hines" Pisinemo DOB: October 14, 1950 Married / Language: English / Race: White Male Date of Admission:  10/10/2017 CC:  Left knee pain History of Present Illness The patient is a 66 year old male who comes in for a preoperative History and Physical. The patient is scheduled for a left total knee arthroplasty to be performed by Dr. Dione Plover. Aluisio, MD at [facility] on 10-10-2017. The patient is being followed for their left knee pain and osteoarthritis. They are now month(s) out from last cortisone injection. Symptoms reported include: pain and aching. The patient feels that they are doing poorly and report their pain level to be moderate. The following medication has been used for pain control: antiinflammatory medication (Advil), Tylenol and Ultram (prn: given by PCP). The patient has reported temporary improvement of their symptoms with: Cortisone injections. Right TKA 6 1/2 years ago by Dr. Percell Miller, doing fairly well at this time. His LEFT knee is getting a lot worse. It is limiting what he can and cannot do. He feels like it is also aggravating his RIGHT knee because he has got up early extra pressure on the RIGHT. He is not having any swelling. It's not giving out on him constantly but he does get episodes occasionally where it does give out. He is at a stage now he is ready to go ahead and get this fixed. They have been treated conservatively in the past for the above stated problem and despite conservative measures, they continue to have progressive pain and severe functional limitations and dysfunction. They have failed non-operative management including home exercise, medications, and injections. It is felt that they would benefit from undergoing total joint replacement. Risks and benefits of the procedure have been discussed with the patient and they elect to proceed with surgery. There are no active contraindications to surgery such as ongoing infection or rapidly progressive  neurological disease.   Problem List/Past Medical  Chronic pain of right knee (M25.561)  Pain of right hip joint (M25.551)  Primary osteoarthritis of left knee (M17.12)  Hypercholesterolemia  Essential Tremor    Allergies  Penicillins  childhood RXN  Family History Cancer  Mother. Congestive Heart Failure  Father. Hypertension  Father.  Social History  Children  1 Current drinker  08/20/2016: Currently drinks beer and wine 8-14 times per week Current work status  working full time Exercise  Exercises daily; does individual sport, other and gym / [employer] Living situation  live with spouse Marital status  married No history of drug/alcohol rehab  Not under pain contract  Number of flights of stairs before winded  4-5 Tobacco / smoke exposure  08/20/2016: no Tobacco use  Never smoker. 08/20/2016: uses less than 1/2 can(s) smokeless per week  Medication History HydroCHLOROthiazide (25MG  Tablet, Oral) Active. TraMADol HCl (50MG  Tablet, Oral) Active. (prn) LORazepam (1MG  Tablet, Oral) Active. (prn) Ibuprofen (600MG  Tablet, Oral) Active. (prn) Fish Oil Concentrate (1 (one) Oral) Specific strength unknown - Active. Turmeric (Oral) Specific strength unknown - Active. Vitamin Active.  Past Surgical History Arthroscopy of Knee  right Rotator Cuff Repair  right Total Knee Replacement  right   Review of Systems  General Not Present- Chills, Fatigue, Fever, Memory Loss, Night Sweats, Weight Gain and Weight Loss. Skin Not Present- Eczema, Hives, Itching, Lesions and Rash. HEENT Not Present- Dentures, Double Vision, Headache, Hearing Loss, Tinnitus and Visual Loss. Respiratory Not Present- Allergies, Chronic Cough, Coughing up blood, Shortness of breath at rest and Shortness of breath with exertion. Cardiovascular Not Present-  Chest Pain, Difficulty Breathing Lying Down, Murmur, Palpitations, Racing/skipping heartbeats and Swelling. Gastrointestinal  Not Present- Abdominal Pain, Bloody Stool, Constipation, Diarrhea, Difficulty Swallowing, Heartburn, Jaundice, Loss of appetitie, Nausea and Vomiting. Male Genitourinary Not Present- Blood in Urine, Discharge, Flank Pain, Incontinence, Painful Urination, Urgency, Urinary frequency, Urinary Retention, Urinating at Night and Weak urinary stream. Musculoskeletal Present- Joint Pain and Morning Stiffness. Not Present- Back Pain, Joint Swelling, Muscle Pain, Muscle Weakness and Spasms. Neurological Not Present- Blackout spells, Difficulty with balance, Dizziness, Paralysis, Tremor and Weakness. Psychiatric Not Present- Insomnia.  Vitals  Weight: 235 lb Height: 72in Weight was reported by patient. Height was reported by patient. Body Surface Area: 2.28 m Body Mass Index: 31.87 kg/m  Pulse: 68 (Regular)  BP: 148/86 (Sitting, Right Arm, Standard)      Physical Exam General Mental Status -Alert, cooperative and good historian. General Appearance-pleasant, Not in acute distress. Orientation-Oriented X3. Build & Nutrition-Well nourished and Well developed.  Head and Neck Head-normocephalic, atraumatic . Neck Global Assessment - supple, no bruit auscultated on the right, no bruit auscultated on the left.  Eye Pupil - Bilateral-Regular and Round. Motion - Bilateral-EOMI.  Chest and Lung Exam Auscultation Breath sounds - clear at anterior chest wall and clear at posterior chest wall. Adventitious sounds - No Adventitious sounds.  Cardiovascular Auscultation Rhythm - Regular rate and rhythm. Heart Sounds - S1 WNL and S2 WNL. Murmurs & Other Heart Sounds - Auscultation of the heart reveals - No Murmurs.  Abdomen Palpation/Percussion Tenderness - Abdomen is non-tender to palpation. Rigidity (guarding) - Abdomen is soft. Auscultation Auscultation of the abdomen reveals - Bowel sounds normal.  Male Genitourinary Note: Not done, not pertinent to present  illness   Musculoskeletal Note: His LEFT knee shows no effusion. His range of motion is 5-125. There is marked crepitus on range of motion with tenderness medial greater than lateral and no instability noted.  Radiographs AP and lateral LEFT knee shows for prosthesis on the rights in good position with no abnormalities. On the LEFT he has bone-on-bone arthritis medial and patellofemoral.   Assessment & Plan Primary osteoarthritis of left knee (M17.12)  Note:Surgical Plans: Left Total Knee Replacement  Disposition: Home with help, Straight to outpatient therapy at [place]  PCP: Dr. Howard Pouch  IV TXA  Anesthesia Issues: None  Patient was instructed on what medications to stop prior to surgery.  Signed electronically by Joelene Millin, III PA-C

## 2017-09-16 NOTE — H&P (Signed)
Henry Knight DOB: [birth year] Married / Language: English / Race: White Male Date of Admission:  10/10/2017 CC:  Left Knee Pain History of Present Illness The patient is a 66 year old male who comes in for a preoperative History and Physical. The patient is scheduled for a left total knee arthroplasty to be performed by Dr. Dione Plover. Aluisio, MD at [facility] on 10-10-2017. The patient is being followed for their left knee pain and osteoarthritis. They are now month(s) out from last cortisone injection. Symptoms reported include: pain and aching. The patient feels that they are doing poorly and report their pain level to be moderate. The following medication has been used for pain control: antiinflammatory medication (Advil), Tylenol and Ultram (prn: given by PCP). The patient has reported temporary improvement of their symptoms with: Cortisone injections. Right TKA 6 1/2 years ago by Dr. Percell Miller, doing fairly well at this time. His LEFT knee is getting a lot worse. It is limiting what he can and cannot do. He feels like it is also aggravating his RIGHT knee because he has got up early extra pressure on the RIGHT. He is not having any swelling. It's not giving out on him constantly but he does get episodes occasionally where it does give out. He is at a stage now he is ready to go ahead and get this fixed. They have been treated conservatively in the past for the above stated problem and despite conservative measures, they continue to have progressive pain and severe functional limitations and dysfunction. They have failed non-operative management including home exercise, medications, and injections. It is felt that they would benefit from undergoing total joint replacement. Risks and benefits of the procedure have been discussed with the patient and they elect to proceed with surgery. There are no active contraindications to surgery such as ongoing infection or rapidly progressive  neurological disease.   Problem List/Past Medical  Chronic pain of right knee (M25.561)  Pain of right hip joint (M25.551)  Primary osteoarthritis of left knee (M17.12)  Hypercholesterolemia  Essential Tremor    Allergies  Penicillins  childhood RXN  Family History Cancer  Mother. Congestive Heart Failure  Father. Hypertension  Father.  Social History Children  1 Current drinker  08/20/2016: Currently drinks beer and wine 8-14 times per week Current work status  working full time Exercise  Exercises daily; does individual sport, other and gym / [employer] Living situation  live with spouse Marital status  married No history of drug/alcohol rehab  Not under pain contract  Number of flights of stairs before winded  4-5 Tobacco / smoke exposure  08/20/2016: no Tobacco use  Never smoker. 08/20/2016: uses less than 1/2 can(s) smokeless per week  Medication History HydroCHLOROthiazide (25MG  Tablet, Oral) Active. TraMADol HCl (50MG  Tablet, Oral) Active. (prn) LORazepam (1MG  Tablet, Oral) Active. (prn) Ibuprofen (600MG  Tablet, Oral) Active. (prn) Fish Oil Concentrate (1 (one) Oral) Specific strength unknown - Active. Turmeric (Oral) Specific strength unknown - Active. Vitamin Active.  Past Surgical History  Arthroscopy of Knee  right Rotator Cuff Repair  right Total Knee Replacement  right   Review of Systems  General Not Present- Chills, Fatigue, Fever, Memory Loss, Night Sweats, Weight Gain and Weight Loss. Skin Not Present- Eczema, Hives, Itching, Lesions and Rash. HEENT Not Present- Dentures, Double Vision, Headache, Hearing Loss, Tinnitus and Visual Loss. Respiratory Not Present- Allergies, Chronic Cough, Coughing up blood, Shortness of breath at rest and Shortness of breath with exertion. Cardiovascular Not Present-  Chest Pain, Difficulty Breathing Lying Down, Murmur, Palpitations, Racing/skipping heartbeats and Swelling. Gastrointestinal  Not Present- Abdominal Pain, Bloody Stool, Constipation, Diarrhea, Difficulty Swallowing, Heartburn, Jaundice, Loss of appetitie, Nausea and Vomiting. Male Genitourinary Not Present- Blood in Urine, Discharge, Flank Pain, Incontinence, Painful Urination, Urgency, Urinary frequency, Urinary Retention, Urinating at Night and Weak urinary stream. Musculoskeletal Present- Joint Pain and Morning Stiffness. Not Present- Back Pain, Joint Swelling, Muscle Pain, Muscle Weakness and Spasms. Neurological Not Present- Blackout spells, Difficulty with balance, Dizziness, Paralysis, Tremor and Weakness. Psychiatric Not Present- Insomnia.  Vitals  Weight: 235 lb Height: 72in Weight was reported by patient. Height was reported by patient. Body Surface Area: 2.28 m Body Mass Index: 31.87 kg/m  Pulse: 68 (Regular)  BP: 148/86 (Sitting, Right Arm, Standard)    Physical Exam  General Mental Status -Alert, cooperative and good historian. General Appearance-pleasant, Not in acute distress. Orientation-Oriented X3. Build & Nutrition-Well nourished and Well developed.  Head and Neck Head-normocephalic, atraumatic . Neck Global Assessment - supple, no bruit auscultated on the right, no bruit auscultated on the left.  Eye Pupil - Bilateral-Regular and Round. Motion - Bilateral-EOMI.  Chest and Lung Exam Auscultation Breath sounds - clear at anterior chest wall and clear at posterior chest wall. Adventitious sounds - No Adventitious sounds.  Cardiovascular Auscultation Rhythm - Regular rate and rhythm. Heart Sounds - S1 WNL and S2 WNL. Murmurs & Other Heart Sounds - Auscultation of the heart reveals - No Murmurs.  Abdomen Palpation/Percussion Tenderness - Abdomen is non-tender to palpation. Rigidity (guarding) - Abdomen is soft. Auscultation Auscultation of the abdomen reveals - Bowel sounds normal.  Male Genitourinary Note: Not done, not pertinent to present  illness   Musculoskeletal Note: His LEFT knee shows no effusion. His range of motion is 5-125. There is marked crepitus on range of motion with tenderness medial greater than lateral and no instability noted.  Radiographs AP and lateral LEFT knee shows for prosthesis on the rights in good position with no abnormalities. On the LEFT he has bone-on-bone arthritis medial and patellofemoral.   Assessment & Plan  Primary osteoarthritis of left knee (M17.12)  Note:Surgical Plans: Left Total Knee Replacement  Disposition: Home with help, Straight to outpatient therapy at [facility]  PCP: Dr. Howard Pouch  IV TXA  Anesthesia Issues: None  Patient was instructed on what medications to stop prior to surgery.  Signed electronically by Joelene Millin, III PA-C

## 2017-10-05 NOTE — Patient Instructions (Addendum)
Henry Knight  10/05/2017   Your procedure is scheduled on: 10-10-17   Report to [facility] Main  Entrance Report to Admitting at 6:15  AM   Call this number if you have problems the morning of surgery [phone]   Do not eat food or drink liquids :After Midnight.     Take these medicines the morning of surgery with A SIP OF WATER: You may take your Lorazepam (Ativan) if needed                                You may not have any metal on your body including hair pins and              piercings  Do not wear jewelry, lotions, powders or deodorant             Men may shave face and neck.   Do not bring valuables to the hospital. [place].  Contacts, dentures or bridgework may not be worn into surgery.  Leave suitcase in the car. After surgery it may be brought to your room.     Please read over the following fact sheets you were given: _____________________________________________________________________          [facility] - Preparing for Surgery Before surgery, you can play an important role.  Because skin is not sterile, your skin needs to be as free of germs as possible.  You can reduce the number of germs on your skin by washing with CHG (chlorahexidine gluconate) soap before surgery.  CHG is an antiseptic cleaner which kills germs and bonds with the skin to continue killing germs even after washing. Please DO NOT use if you have an allergy to CHG or antibacterial soaps.  If your skin becomes reddened/irritated stop using the CHG and inform your nurse when you arrive at Short Stay. Do not shave (including legs and underarms) for at least 48 hours prior to the first CHG shower.  You may shave your face/neck. Please follow these instructions carefully:  1.  Shower with CHG Soap the night before surgery and the  morning of Surgery.  2.  If you choose to wash your hair, wash your hair first as usual with  your  normal  shampoo.  3.  After you shampoo, rinse your hair and body thoroughly to remove the  shampoo.                           4.  Use CHG as you would any other liquid soap.  You can apply chg directly  to the skin and wash                       Gently with a scrungie or clean washcloth.  5.  Apply the CHG Soap to your body ONLY FROM THE NECK DOWN.   Do not use on face/ open                           Wound or open sores. Avoid contact with eyes, ears mouth and genitals (private parts).  Wash face,  Genitals (private parts) with your normal soap.             6.  Wash thoroughly, paying special attention to the area where your surgery  will be performed.  7.  Thoroughly rinse your body with warm water from the neck down.  8.  DO NOT shower/wash with your normal soap after using and rinsing off  the CHG Soap.                9.  Pat yourself dry with a clean towel.            10.  Wear clean pajamas.            11.  Place clean sheets on your bed the night of your first shower and do not  sleep with pets. Day of Surgery : Do not apply any lotions/deodorants the morning of surgery.  Please wear clean clothes to the hospital/surgery center.  FAILURE TO FOLLOW THESE INSTRUCTIONS MAY RESULT IN THE CANCELLATION OF YOUR SURGERY PATIENT SIGNATURE_________________________________  NURSE SIGNATURE__________________________________  ________________________________________________________________________   [name]  An incentive spirometer is a tool that can help keep your lungs clear and active. This tool measures how well you are filling your lungs with each breath. Taking long deep breaths may help reverse or decrease the chance of developing breathing (pulmonary) problems (especially infection) following:  A long period of time when you are unable to move or be active. BEFORE THE PROCEDURE   If the spirometer includes an indicator to show your best effort,  your nurse or respiratory therapist will set it to a desired goal.  If possible, sit up straight or lean slightly forward. Try not to slouch.  Hold the incentive spirometer in an upright position. INSTRUCTIONS FOR USE  1. Sit on the edge of your bed if possible, or sit up as far as you can in bed or on a chair. 2. Hold the incentive spirometer in an upright position. 3. Breathe out normally. 4. Place the mouthpiece in your mouth and seal your lips tightly around it. 5. Breathe in slowly and as deeply as possible, raising the piston or the ball toward the top of the column. 6. Hold your breath for 3-5 seconds or for as long as possible. Allow the piston or ball to fall to the bottom of the column. 7. Remove the mouthpiece from your mouth and breathe out normally. 8. Rest for a few seconds and repeat Steps 1 through 7 at least 10 times every 1-2 hours when you are awake. Take your time and take a few normal breaths between deep breaths. 9. The spirometer may include an indicator to show your best effort. Use the indicator as a goal to work toward during each repetition. 10. After each set of 10 deep breaths, practice coughing to be sure your lungs are clear. If you have an incision (the cut made at the time of surgery), support your incision when coughing by placing a pillow or rolled up towels firmly against it. Once you are able to get out of bed, walk around indoors and cough well. You may stop using the incentive spirometer when instructed by your caregiver.  RISKS AND COMPLICATIONS  Take your time so you do not get dizzy or light-headed.  If you are in pain, you may need to take or ask for pain medication before doing incentive spirometry. It is harder to take a deep breath if you are having pain.  AFTER USE  Rest and breathe slowly and easily.  It can be helpful to keep track of a log of your progress. Your caregiver can provide you with a simple table to help with this. If you are  using the spirometer at home, follow these instructions: [place] IF:   You are having difficultly using the spirometer.  You have trouble using the spirometer as often as instructed.  Your pain medication is not giving enough relief while using the spirometer.  You develop fever of 100.5 F (38.1 C) or higher. SEEK IMMEDIATE MEDICAL CARE IF:   You cough up bloody sputum that had not been present before.  You develop fever of 102 F (38.9 C) or greater.  You develop worsening pain at or near the incision site. MAKE SURE YOU:   Understand these instructions.  Will watch your condition.  Will get help right away if you are not doing well or get worse. Document Released: 01/31/2007 Document Revised: 12/13/2011 Document Reviewed: 04/03/2007 ExitCare Patient Information 2014 Exit[employer], [place].   ________________________________________________________________________  WHAT IS A BLOOD TRANSFUSION? Blood Transfusion Information  A transfusion is the replacement of blood or some of its parts. Blood is made up of multiple cells which provide different functions.  Red blood cells carry oxygen and are used for blood loss replacement.  White blood cells fight against infection.  Platelets control bleeding.  Plasma helps clot blood.  Other blood products are available for specialized needs, such as hemophilia or other clotting disorders. BEFORE THE TRANSFUSION  Who gives blood for transfusions?   Healthy volunteers who are fully evaluated to make sure their blood is safe. This is blood bank blood. Transfusion therapy is the safest it has ever been in the practice of medicine. Before blood is taken from a donor, a complete history is taken to make sure that person has no history of diseases nor engages in risky social behavior (examples are intravenous drug use or sexual activity with multiple partners). The donor's travel history is screened to minimize risk of transmitting  infections, such as malaria. The donated blood is tested for signs of infectious diseases, such as HIV and hepatitis. The blood is then tested to be sure it is compatible with you in order to minimize the chance of a transfusion reaction. If you or a relative donates blood, this is often done in anticipation of surgery and is not appropriate for emergency situations. It takes many days to process the donated blood. RISKS AND COMPLICATIONS Although transfusion therapy is very safe and saves many lives, the main dangers of transfusion include:   Getting an infectious disease.  Developing a transfusion reaction. This is an allergic reaction to something in the blood you were given. Every precaution is taken to prevent this. The decision to have a blood transfusion has been considered carefully by your caregiver before blood is given. Blood is not given unless the benefits outweigh the risks. AFTER THE TRANSFUSION  Right after receiving a blood transfusion, you will usually feel much better and more energetic. This is especially true if your red blood cells have gotten low (anemic). The transfusion raises the level of the red blood cells which carry oxygen, and this usually causes an energy increase.  The nurse administering the transfusion will monitor you carefully for complications. HOME CARE INSTRUCTIONS  No special instructions are needed after a transfusion. You may find your energy is better. Speak with your caregiver about any limitations on activity for underlying diseases  you may have. SEEK MEDICAL CARE IF:   Your condition is not improving after your transfusion.  You develop redness or irritation at the intravenous (IV) site. SEEK IMMEDIATE MEDICAL CARE IF:  Any of the following symptoms occur over the next 12 hours:  Shaking chills.  You have a temperature by mouth above 102 F (38.9 C), not controlled by medicine.  Chest, back, or muscle pain.  People around you feel you are  not acting correctly or are confused.  Shortness of breath or difficulty breathing.  Dizziness and fainting.  You get a rash or develop hives.  You have a decrease in urine output.  Your urine turns a dark color or changes to pink, red, or brown. Any of the following symptoms occur over the next 10 days:  You have a temperature by mouth above 102 F (38.9 C), not controlled by medicine.  Shortness of breath.  Weakness after normal activity.  The white part of the eye turns yellow (jaundice).  You have a decrease in the amount of urine or are urinating less often.  Your urine turns a dark color or changes to pink, red, or brown. Document Released: 09/17/2000 Document Revised: 12/13/2011 Document Reviewed: 05/06/2008 [facility] Patient Information 2014 [place], [place].  _______________________________________________________________________

## 2017-10-06 ENCOUNTER — Encounter (HOSPITAL_COMMUNITY)
Admission: RE | Admit: 2017-10-06 | Discharge: 2017-10-06 | Disposition: A | Discharge status: Home / Self Care | Payer: Medicare Other | Source: Ambulatory Visit | Attending: Orthopedic Surgery | Admitting: Orthopedic Surgery

## 2017-10-06 ENCOUNTER — Encounter (INDEPENDENT_AMBULATORY_CARE_PROVIDER_SITE_OTHER): Payer: Self-pay

## 2017-10-06 ENCOUNTER — Encounter (HOSPITAL_COMMUNITY): Payer: Self-pay

## 2017-10-06 ENCOUNTER — Other Ambulatory Visit: Payer: Self-pay

## 2017-10-06 DIAGNOSIS — M1712 Unilateral primary osteoarthritis, left knee: Secondary | ICD-10-CM | POA: Diagnosis not present

## 2017-10-06 DIAGNOSIS — Z01812 Encounter for preprocedural laboratory examination: Secondary | ICD-10-CM | POA: Insufficient documentation

## 2017-10-06 DIAGNOSIS — Z01810 Encounter for preprocedural cardiovascular examination: Secondary | ICD-10-CM | POA: Diagnosis present

## 2017-10-06 DIAGNOSIS — I10 Essential (primary) hypertension: Secondary | ICD-10-CM | POA: Insufficient documentation

## 2017-10-06 LAB — COMPREHENSIVE METABOLIC PANEL
ALT: 28 U/L (ref 17–63)
AST: 30 U/L (ref 15–41)
Albumin: 4 g/dL (ref 3.5–5.0)
Alkaline Phosphatase: 63 U/L (ref 38–126)
Anion gap: 5 (ref 5–15)
BUN: 18 mg/dL (ref 6–20)
CHLORIDE: 107 mmol/L (ref 101–111)
CO2: 25 mmol/L (ref 22–32)
CREATININE: 0.84 mg/dL (ref 0.61–1.24)
Calcium: 9 mg/dL (ref 8.9–10.3)
GFR calc Af Amer: >60 mL/min (ref >60)
Glucose, Bld: 98 mg/dL (ref 65–99)
Potassium: 4.3 mmol/L (ref 3.5–5.1)
SODIUM: 137 mmol/L (ref 135–145)
Total Bilirubin: 1.3 mg/dL — ABNORMAL HIGH (ref 0.3–1.2)
Total Protein: 6.9 g/dL (ref 6.5–8.1)

## 2017-10-06 LAB — CBC
HCT: 42.1 % (ref 39.0–52.0)
Hemoglobin: 15 g/dL (ref 13.0–17.0)
MCH: 33 pg (ref 26.0–34.0)
MCHC: 35.6 g/dL (ref 30.0–36.0)
MCV: 92.7 fL (ref 78.0–100.0)
PLATELETS: 188 10*3/uL (ref 150–400)
RBC: 4.54 MIL/uL (ref 4.22–5.81)
RDW: 12.2 % (ref 11.5–15.5)
WBC: 4.7 10*3/uL (ref 4.0–10.5)

## 2017-10-06 LAB — SURGICAL PCR SCREEN
MRSA, PCR: NEGATIVE
STAPHYLOCOCCUS AUREUS: NEGATIVE

## 2017-10-06 LAB — PROTIME-INR
INR: 0.99
Prothrombin Time: 13 seconds (ref 11.4–15.2)

## 2017-10-06 LAB — APTT: aPTT: 29 seconds (ref 24–36)

## 2017-10-06 LAB — ABO/RH: ABO/RH(D): O NEG

## 2017-10-09 ENCOUNTER — Ambulatory Visit: Payer: Self-pay | Admitting: Orthopedic Surgery

## 2017-10-09 MED ORDER — VANCOMYCIN HCL 10 G IV SOLR
1500.0000 mg | INTRAVENOUS | Status: AC
  Administered 2017-10-10: 1500 mg via INTRAVENOUS
  Filled 2017-10-09 (×2): qty 1500

## 2017-10-09 NOTE — H&P (View-Only) (Signed)
Henry Knight DOB: [birth year] Married / Language: English / Race: White Male Date of Admission:  10/10/2017 CC:  Left Knee Pain History of Present Illness The patient is a 67 year old male who comes in for a preoperative History and Physical. The patient is scheduled for a left total knee arthroplasty to be performed by Dr. Dione Plover. Aluisio, MD at [facility] on 10-10-2017. The patient is being followed for their left knee pain and osteoarthritis. They are now month(s) out from last cortisone injection. Symptoms reported include: pain and aching. The patient feels that they are doing poorly and report their pain level to be moderate. The following medication has been used for pain control: antiinflammatory medication (Advil), Tylenol and Ultram (prn: given by PCP). The patient has reported temporary improvement of their symptoms with: Cortisone injections. Right TKA 6 1/2 years ago by Dr. Percell Miller, doing fairly well at this time. His LEFT knee is getting a lot worse. It is limiting what he can and cannot do. He feels like it is also aggravating his RIGHT knee because he has got up early extra pressure on the RIGHT. He is not having any swelling. It's not giving out on him constantly but he does get episodes occasionally where it does give out. He is at a stage now he is ready to go ahead and get this fixed. They have been treated conservatively in the past for the above stated problem and despite conservative measures, they continue to have progressive pain and severe functional limitations and dysfunction. They have failed non-operative management including home exercise, medications, and injections. It is felt that they would benefit from undergoing total joint replacement. Risks and benefits of the procedure have been discussed with the patient and they elect to proceed with surgery. There are no active contraindications to surgery such as ongoing infection or rapidly progressive  neurological disease.   Problem List/Past Medical  Chronic pain of right knee (M25.561)  Pain of right hip joint (M25.551)  Primary osteoarthritis of left knee (M17.12)  Hypercholesterolemia  Essential Tremor    Allergies  Penicillins  childhood RXN  Family History Cancer  Mother. Congestive Heart Failure  Father. Hypertension  Father.  Social History Children  1 Current drinker  08/20/2016: Currently drinks beer and wine 8-14 times per week Current work status  working full time Exercise  Exercises daily; does individual sport, other and gym / [employer] Living situation  live with spouse Marital status  married No history of drug/alcohol rehab  Not under pain contract  Number of flights of stairs before winded  4-5 Tobacco / smoke exposure  08/20/2016: no Tobacco use  Never smoker. 08/20/2016: uses less than 1/2 can(s) smokeless per week  Medication History HydroCHLOROthiazide (25MG  Tablet, Oral) Active. TraMADol HCl (50MG  Tablet, Oral) Active. (prn) LORazepam (1MG  Tablet, Oral) Active. (prn) Ibuprofen (600MG  Tablet, Oral) Active. (prn) Fish Oil Concentrate (1 (one) Oral) Specific strength unknown - Active. Turmeric (Oral) Specific strength unknown - Active. Vitamin Active.  Past Surgical History  Arthroscopy of Knee  right Rotator Cuff Repair  right Total Knee Replacement  right   Review of Systems  General Not Present- Chills, Fatigue, Fever, Memory Loss, Night Sweats, Weight Gain and Weight Loss. Skin Not Present- Eczema, Hives, Itching, Lesions and Rash. HEENT Not Present- Dentures, Double Vision, Headache, Hearing Loss, Tinnitus and Visual Loss. Respiratory Not Present- Allergies, Chronic Cough, Coughing up blood, Shortness of breath at rest and Shortness of breath with exertion. Cardiovascular Not Present-  Chest Pain, Difficulty Breathing Lying Down, Murmur, Palpitations, Racing/skipping heartbeats and  Swelling. Gastrointestinal Not Present- Abdominal Pain, Bloody Stool, Constipation, Diarrhea, Difficulty Swallowing, Heartburn, Jaundice, Loss of appetitie, Nausea and Vomiting. Male Genitourinary Not Present- Blood in Urine, Discharge, Flank Pain, Incontinence, Painful Urination, Urgency, Urinary frequency, Urinary Retention, Urinating at Night and Weak urinary stream. Musculoskeletal Present- Joint Pain and Morning Stiffness. Not Present- Back Pain, Joint Swelling, Muscle Pain, Muscle Weakness and Spasms. Neurological Not Present- Blackout spells, Difficulty with balance, Dizziness, Paralysis, Tremor and Weakness. Psychiatric Not Present- Insomnia.  Vitals  Weight: 235 lb Height: 72in Weight was reported by patient. Height was reported by patient. Body Surface Area: 2.28 m Body Mass Index: 31.87 kg/m  Pulse: 68 (Regular)  BP: 148/86 (Sitting, Right Arm, Standard)    Physical Exam  General Mental Status -Alert, cooperative and good historian. General Appearance-pleasant, Not in acute distress. Orientation-Oriented X3. Build & Nutrition-Well nourished and Well developed.  Head and Neck Head-normocephalic, atraumatic . Neck Global Assessment - supple, no bruit auscultated on the right, no bruit auscultated on the left.  Eye Pupil - Bilateral-Regular and Round. Motion - Bilateral-EOMI.  Chest and Lung Exam Auscultation Breath sounds - clear at anterior chest wall and clear at posterior chest wall. Adventitious sounds - No Adventitious sounds.  Cardiovascular Auscultation Rhythm - Regular rate and rhythm. Heart Sounds - S1 WNL and S2 WNL. Murmurs & Other Heart Sounds - Auscultation of the heart reveals - No Murmurs.  Abdomen Palpation/Percussion Tenderness - Abdomen is non-tender to palpation. Rigidity (guarding) - Abdomen is soft. Auscultation Auscultation of the abdomen reveals - Bowel sounds normal.  Male Genitourinary Note: Not  done, not pertinent to present illness   Musculoskeletal Note: His LEFT knee shows no effusion. His range of motion is 5-125. There is marked crepitus on range of motion with tenderness medial greater than lateral and no instability noted.  Radiographs AP and lateral LEFT knee shows for prosthesis on the rights in good position with no abnormalities. On the LEFT he has bone-on-bone arthritis medial and patellofemoral.   Assessment & Plan  Primary osteoarthritis of left knee (M17.12)  Note:Surgical Plans: Left Total Knee Replacement  Disposition: Home with help, Straight to outpatient therapy at [place]  PCP: Dr. Howard Pouch  IV TXA  Anesthesia Issues: None  Patient was instructed on what medications to stop prior to surgery.  Signed electronically by Joelene Millin, III PA-C

## 2017-10-09 NOTE — H&P (Signed)
Henry Knight DOB: 1951/09/05 Married / Language: English / Race: White Male Date of Admission:  10/10/2017 CC:  Left Knee Pain History of Present Illness The patient is a 67 year old male who comes in for a preoperative History and Physical. The patient is scheduled for a left total knee arthroplasty to be performed by Dr. Dione Plover. Aluisio, MD at [facility] on 10-10-2017. The patient is being followed for their left knee pain and osteoarthritis. They are now month(s) out from last cortisone injection. Symptoms reported include: pain and aching. The patient feels that they are doing poorly and report their pain level to be moderate. The following medication has been used for pain control: antiinflammatory medication (Advil), Tylenol and Ultram (prn: given by PCP). The patient has reported temporary improvement of their symptoms with: Cortisone injections. Right TKA 6 1/2 years ago by Dr. Percell Miller, doing fairly well at this time. His LEFT knee is getting a lot worse. It is limiting what he can and cannot do. He feels like it is also aggravating his RIGHT knee because he has got up early extra pressure on the RIGHT. He is not having any swelling. It's not giving out on him constantly but he does get episodes occasionally where it does give out. He is at a stage now he is ready to go ahead and get this fixed. They have been treated conservatively in the past for the above stated problem and despite conservative measures, they continue to have progressive pain and severe functional limitations and dysfunction. They have failed non-operative management including home exercise, medications, and injections. It is felt that they would benefit from undergoing total joint replacement. Risks and benefits of the procedure have been discussed with the patient and they elect to proceed with surgery. There are no active contraindications to surgery such as ongoing infection or rapidly progressive  neurological disease.   Problem List/Past Medical  Chronic pain of right knee (M25.561)  Pain of right hip joint (M25.551)  Primary osteoarthritis of left knee (M17.12)  Hypercholesterolemia  Essential Tremor    Allergies  Penicillins  childhood RXN  Family History Cancer  Mother. Congestive Heart Failure  Father. Hypertension  Father.  Social History Children  1 Current drinker  08/20/2016: Currently drinks beer and wine 8-14 times per week Current work status  working full time Exercise  Exercises daily; does individual sport, other and gym / [employer] Living situation  live with spouse Marital status  married No history of drug/alcohol rehab  Not under pain contract  Number of flights of stairs before winded  4-5 Tobacco / smoke exposure  08/20/2016: no Tobacco use  Never smoker. 08/20/2016: uses less than 1/2 can(s) smokeless per week  Medication History HydroCHLOROthiazide (25MG  Tablet, Oral) Active. TraMADol HCl (50MG  Tablet, Oral) Active. (prn) LORazepam (1MG  Tablet, Oral) Active. (prn) Ibuprofen (600MG  Tablet, Oral) Active. (prn) Fish Oil Concentrate (1 (one) Oral) Specific strength unknown - Active. Turmeric (Oral) Specific strength unknown - Active. Vitamin Active.  Past Surgical History  Arthroscopy of Knee  right Rotator Cuff Repair  right Total Knee Replacement  right   Review of Systems  General Not Present- Chills, Fatigue, Fever, Memory Loss, Night Sweats, Weight Gain and Weight Loss. Skin Not Present- Eczema, Hives, Itching, Lesions and Rash. HEENT Not Present- Dentures, Double Vision, Headache, Hearing Loss, Tinnitus and Visual Loss. Respiratory Not Present- Allergies, Chronic Cough, Coughing up blood, Shortness of breath at rest and Shortness of breath with exertion. Cardiovascular Not Present-  Chest Pain, Difficulty Breathing Lying Down, Murmur, Palpitations, Racing/skipping heartbeats and  Swelling. Gastrointestinal Not Present- Abdominal Pain, Bloody Stool, Constipation, Diarrhea, Difficulty Swallowing, Heartburn, Jaundice, Loss of appetitie, Nausea and Vomiting. Male Genitourinary Not Present- Blood in Urine, Discharge, Flank Pain, Incontinence, Painful Urination, Urgency, Urinary frequency, Urinary Retention, Urinating at Night and Weak urinary stream. Musculoskeletal Present- Joint Pain and Morning Stiffness. Not Present- Back Pain, Joint Swelling, Muscle Pain, Muscle Weakness and Spasms. Neurological Not Present- Blackout spells, Difficulty with balance, Dizziness, Paralysis, Tremor and Weakness. Psychiatric Not Present- Insomnia.  Vitals  Weight: 235 lb Height: 72in Weight was reported by patient. Height was reported by patient. Body Surface Area: 2.28 m Body Mass Index: 31.87 kg/m  Pulse: 68 (Regular)  BP: 148/86 (Sitting, Right Arm, Standard)    Physical Exam  General Mental Status -Alert, cooperative and good historian. General Appearance-pleasant, Not in acute distress. Orientation-Oriented X3. Build & Nutrition-Well nourished and Well developed.  Head and Neck Head-normocephalic, atraumatic . Neck Global Assessment - supple, no bruit auscultated on the right, no bruit auscultated on the left.  Eye Pupil - Bilateral-Regular and Round. Motion - Bilateral-EOMI.  Chest and Lung Exam Auscultation Breath sounds - clear at anterior chest wall and clear at posterior chest wall. Adventitious sounds - No Adventitious sounds.  Cardiovascular Auscultation Rhythm - Regular rate and rhythm. Heart Sounds - S1 WNL and S2 WNL. Murmurs & Other Heart Sounds - Auscultation of the heart reveals - No Murmurs.  Abdomen Palpation/Percussion Tenderness - Abdomen is non-tender to palpation. Rigidity (guarding) - Abdomen is soft. Auscultation Auscultation of the abdomen reveals - Bowel sounds normal.  Male Genitourinary Note: Not  done, not pertinent to present illness   Musculoskeletal Note: His LEFT knee shows no effusion. His range of motion is 5-125. There is marked crepitus on range of motion with tenderness medial greater than lateral and no instability noted.  Radiographs AP and lateral LEFT knee shows for prosthesis on the rights in good position with no abnormalities. On the LEFT he has bone-on-bone arthritis medial and patellofemoral.   Assessment & Plan  Primary osteoarthritis of left knee (M17.12)  Note:Surgical Plans: Left Total Knee Replacement  Disposition: Home with help, Straight to outpatient therapy at [place]  PCP: Dr. Howard Pouch  IV TXA  Anesthesia Issues: None  Patient was instructed on what medications to stop prior to surgery.  Signed electronically by Joelene Millin, III PA-C

## 2017-10-10 ENCOUNTER — Encounter (HOSPITAL_COMMUNITY): Payer: Self-pay | Admitting: Emergency Medicine

## 2017-10-10 ENCOUNTER — Inpatient Hospital Stay (HOSPITAL_COMMUNITY): Payer: Medicare Other | Admitting: Certified Registered Nurse Anesthetist

## 2017-10-10 ENCOUNTER — Inpatient Hospital Stay (HOSPITAL_COMMUNITY)
Admission: RE | Admit: 2017-10-10 | Discharge: 2017-10-11 | DRG: 470 | Disposition: A | Discharge status: Home / Self Care | Payer: Medicare Other | Source: Ambulatory Visit | Attending: Orthopedic Surgery | Admitting: Orthopedic Surgery

## 2017-10-10 ENCOUNTER — Other Ambulatory Visit: Payer: Self-pay

## 2017-10-10 ENCOUNTER — Encounter (HOSPITAL_COMMUNITY)
Admission: RE | Disposition: A | Discharge status: Home / Self Care | Payer: Self-pay | Source: Ambulatory Visit | Attending: Orthopedic Surgery

## 2017-10-10 DIAGNOSIS — I10 Essential (primary) hypertension: Secondary | ICD-10-CM | POA: Diagnosis present

## 2017-10-10 DIAGNOSIS — Z79899 Other long term (current) drug therapy: Secondary | ICD-10-CM | POA: Diagnosis not present

## 2017-10-10 DIAGNOSIS — E7800 Pure hypercholesterolemia, unspecified: Secondary | ICD-10-CM | POA: Diagnosis present

## 2017-10-10 DIAGNOSIS — Z86010 Personal history of colonic polyps: Secondary | ICD-10-CM

## 2017-10-10 DIAGNOSIS — Z96651 Presence of right artificial knee joint: Secondary | ICD-10-CM | POA: Diagnosis present

## 2017-10-10 DIAGNOSIS — Z85828 Personal history of other malignant neoplasm of skin: Secondary | ICD-10-CM

## 2017-10-10 DIAGNOSIS — G8918 Other acute postprocedural pain: Secondary | ICD-10-CM | POA: Diagnosis not present

## 2017-10-10 DIAGNOSIS — G8929 Other chronic pain: Secondary | ICD-10-CM | POA: Diagnosis present

## 2017-10-10 DIAGNOSIS — E781 Pure hyperglyceridemia: Secondary | ICD-10-CM | POA: Diagnosis not present

## 2017-10-10 DIAGNOSIS — G250 Essential tremor: Secondary | ICD-10-CM | POA: Diagnosis present

## 2017-10-10 DIAGNOSIS — Z880 Allergy status to penicillin: Secondary | ICD-10-CM

## 2017-10-10 DIAGNOSIS — M1712 Unilateral primary osteoarthritis, left knee: Secondary | ICD-10-CM | POA: Diagnosis not present

## 2017-10-10 DIAGNOSIS — Z8249 Family history of ischemic heart disease and other diseases of the circulatory system: Secondary | ICD-10-CM

## 2017-10-10 DIAGNOSIS — Z85820 Personal history of malignant melanoma of skin: Secondary | ICD-10-CM

## 2017-10-10 DIAGNOSIS — M179 Osteoarthritis of knee, unspecified: Secondary | ICD-10-CM | POA: Diagnosis present

## 2017-10-10 DIAGNOSIS — M1710 Unilateral primary osteoarthritis, unspecified knee: Secondary | ICD-10-CM

## 2017-10-10 DIAGNOSIS — M25762 Osteophyte, left knee: Secondary | ICD-10-CM | POA: Diagnosis present

## 2017-10-10 HISTORY — PX: TOTAL KNEE ARTHROPLASTY: SHX125

## 2017-10-10 LAB — TYPE AND SCREEN
ABO/RH(D): O NEG
Antibody Screen: NEGATIVE

## 2017-10-10 SURGERY — ARTHROPLASTY, KNEE, TOTAL
Anesthesia: Regional | Site: Knee | Laterality: Left

## 2017-10-10 MED ORDER — ONDANSETRON HCL 4 MG/2ML IJ SOLN
INTRAMUSCULAR | Status: AC
  Filled 2017-10-10: qty 2

## 2017-10-10 MED ORDER — PROPOFOL 10 MG/ML IV BOLUS
INTRAVENOUS | Status: AC
  Filled 2017-10-10: qty 40

## 2017-10-10 MED ORDER — MIDAZOLAM HCL 2 MG/2ML IJ SOLN
INTRAMUSCULAR | Status: AC
  Filled 2017-10-10: qty 2

## 2017-10-10 MED ORDER — CHLORHEXIDINE GLUCONATE 4 % EX LIQD: 60.0000 mL | Freq: Once | CUTANEOUS | Status: DC

## 2017-10-10 MED ORDER — ONDANSETRON HCL 4 MG/2ML IJ SOLN: 4.0000 mg | Freq: Four times a day (QID) | INTRAMUSCULAR | Status: DC | PRN

## 2017-10-10 MED ORDER — HYDROCHLOROTHIAZIDE 25 MG PO TABS
50.0000 mg | ORAL_TABLET | Freq: Every day | ORAL | Status: DC
  Administered 2017-10-11: 50 mg via ORAL
  Filled 2017-10-10: qty 2

## 2017-10-10 MED ORDER — ONDANSETRON HCL 4 MG PO TABS: 4.0000 mg | ORAL_TABLET | Freq: Four times a day (QID) | ORAL | Status: DC | PRN

## 2017-10-10 MED ORDER — SODIUM CHLORIDE 0.9 % IJ SOLN
INTRAMUSCULAR | Status: AC
  Filled 2017-10-10: qty 10

## 2017-10-10 MED ORDER — DOCUSATE SODIUM 100 MG PO CAPS
100.0000 mg | ORAL_CAPSULE | Freq: Two times a day (BID) | ORAL | Status: DC
  Administered 2017-10-10 – 2017-10-11 (×3): 100 mg via ORAL
  Filled 2017-10-10 (×3): qty 1

## 2017-10-10 MED ORDER — TRAMADOL HCL 50 MG PO TABS: 50.0000 mg | ORAL_TABLET | Freq: Four times a day (QID) | ORAL | Status: DC | PRN

## 2017-10-10 MED ORDER — MORPHINE SULFATE (PF) 2 MG/ML IV SOLN: 1.0000 mg | INTRAVENOUS | Status: DC | PRN

## 2017-10-10 MED ORDER — GABAPENTIN 300 MG PO CAPS
300.0000 mg | ORAL_CAPSULE | Freq: Once | ORAL | Status: AC
  Administered 2017-10-10: 300 mg via ORAL
  Filled 2017-10-10: qty 1

## 2017-10-10 MED ORDER — RIVAROXABAN 10 MG PO TABS
10.0000 mg | ORAL_TABLET | Freq: Every day | ORAL | Status: DC
  Administered 2017-10-11: 10 mg via ORAL
  Filled 2017-10-10: qty 1

## 2017-10-10 MED ORDER — METOCLOPRAMIDE HCL 5 MG/ML IJ SOLN: 5.0000 mg | Freq: Three times a day (TID) | INTRAMUSCULAR | Status: DC | PRN

## 2017-10-10 MED ORDER — TRANEXAMIC ACID 1000 MG/10ML IV SOLN
1000.0000 mg | Freq: Once | INTRAVENOUS | Status: AC
  Administered 2017-10-10: 1000 mg via INTRAVENOUS
  Filled 2017-10-10: qty 1100

## 2017-10-10 MED ORDER — ACETAMINOPHEN 650 MG RE SUPP: 650.0000 mg | RECTAL | Status: DC | PRN

## 2017-10-10 MED ORDER — DEXAMETHASONE SODIUM PHOSPHATE 10 MG/ML IJ SOLN
INTRAMUSCULAR | Status: AC
  Filled 2017-10-10: qty 1

## 2017-10-10 MED ORDER — ACETAMINOPHEN 325 MG PO TABS: 650.0000 mg | ORAL_TABLET | ORAL | Status: DC | PRN

## 2017-10-10 MED ORDER — BUPIVACAINE LIPOSOME 1.3 % IJ SUSP
INTRAMUSCULAR | Status: DC | PRN
  Administered 2017-10-10: 20 mL

## 2017-10-10 MED ORDER — ACETAMINOPHEN 500 MG PO TABS
1000.0000 mg | ORAL_TABLET | Freq: Four times a day (QID) | ORAL | Status: AC
  Administered 2017-10-10 – 2017-10-11 (×4): 1000 mg via ORAL
  Filled 2017-10-10 (×4): qty 2

## 2017-10-10 MED ORDER — MIDAZOLAM HCL 5 MG/5ML IJ SOLN
INTRAMUSCULAR | Status: DC | PRN
  Administered 2017-10-10 (×2): 1 mg via INTRAVENOUS

## 2017-10-10 MED ORDER — PROPOFOL 500 MG/50ML IV EMUL
INTRAVENOUS | Status: DC | PRN
  Administered 2017-10-10: 100 ug/kg/min via INTRAVENOUS

## 2017-10-10 MED ORDER — DIPHENHYDRAMINE HCL 12.5 MG/5ML PO ELIX: 12.5000 mg | ORAL_SOLUTION | ORAL | Status: DC | PRN

## 2017-10-10 MED ORDER — SODIUM CHLORIDE 0.9 % IV SOLN
INTRAVENOUS | Status: DC
  Administered 2017-10-10 – 2017-10-11 (×2): via INTRAVENOUS

## 2017-10-10 MED ORDER — ACETAMINOPHEN 10 MG/ML IV SOLN
1000.0000 mg | Freq: Once | INTRAVENOUS | Status: AC
  Administered 2017-10-10: 1000 mg via INTRAVENOUS
  Filled 2017-10-10: qty 100

## 2017-10-10 MED ORDER — TRANEXAMIC ACID 1000 MG/10ML IV SOLN: 1000.0000 mg | INTRAVENOUS | Status: DC

## 2017-10-10 MED ORDER — MENTHOL 3 MG MT LOZG: 1.0000 | LOZENGE | OROMUCOSAL | Status: DC | PRN

## 2017-10-10 MED ORDER — VANCOMYCIN HCL IN DEXTROSE 1-5 GM/200ML-% IV SOLN
1000.0000 mg | Freq: Two times a day (BID) | INTRAVENOUS | Status: AC
  Administered 2017-10-10: 1000 mg via INTRAVENOUS
  Filled 2017-10-10: qty 200

## 2017-10-10 MED ORDER — ROPIVACAINE HCL 7.5 MG/ML IJ SOLN
INTRAMUSCULAR | Status: DC | PRN
  Administered 2017-10-10: 20 mL via PERINEURAL

## 2017-10-10 MED ORDER — BUPIVACAINE LIPOSOME 1.3 % IJ SUSP
20.0000 mL | Freq: Once | INTRAMUSCULAR | Status: DC
  Filled 2017-10-10: qty 20

## 2017-10-10 MED ORDER — PHENYLEPHRINE HCL 10 MG/ML IJ SOLN
INTRAMUSCULAR | Status: DC | PRN
  Administered 2017-10-10: 80 ug via INTRAVENOUS

## 2017-10-10 MED ORDER — SODIUM CHLORIDE 0.9 % IJ SOLN
INTRAMUSCULAR | Status: AC
  Filled 2017-10-10: qty 50

## 2017-10-10 MED ORDER — PROPOFOL 10 MG/ML IV BOLUS
INTRAVENOUS | Status: DC | PRN
  Administered 2017-10-10 (×2): 20 mg via INTRAVENOUS

## 2017-10-10 MED ORDER — PHENOL 1.4 % MT LIQD: 1.0000 | OROMUCOSAL | Status: DC | PRN

## 2017-10-10 MED ORDER — METHOCARBAMOL 1000 MG/10ML IJ SOLN
500.0000 mg | Freq: Four times a day (QID) | INTRAMUSCULAR | Status: DC | PRN
  Administered 2017-10-10: 500 mg via INTRAVENOUS
  Filled 2017-10-10: qty 550

## 2017-10-10 MED ORDER — PHENYLEPHRINE 40 MCG/ML (10ML) SYRINGE FOR IV PUSH (FOR BLOOD PRESSURE SUPPORT)
PREFILLED_SYRINGE | INTRAVENOUS | Status: AC
  Filled 2017-10-10: qty 10

## 2017-10-10 MED ORDER — FENTANYL CITRATE (PF) 100 MCG/2ML IJ SOLN: 25.0000 ug | INTRAMUSCULAR | Status: DC | PRN

## 2017-10-10 MED ORDER — FLEET ENEMA 7-19 GM/118ML RE ENEM: 1.0000 | ENEMA | Freq: Once | RECTAL | Status: DC | PRN

## 2017-10-10 MED ORDER — OXYCODONE HCL 5 MG PO TABS
10.0000 mg | ORAL_TABLET | ORAL | Status: DC | PRN
  Administered 2017-10-11 (×2): 10 mg via ORAL
  Filled 2017-10-10 (×4): qty 2

## 2017-10-10 MED ORDER — METOCLOPRAMIDE HCL 5 MG PO TABS: 5.0000 mg | ORAL_TABLET | Freq: Three times a day (TID) | ORAL | Status: DC | PRN

## 2017-10-10 MED ORDER — POLYETHYLENE GLYCOL 3350 17 G PO PACK: 17.0000 g | PACK | Freq: Every day | ORAL | Status: DC | PRN

## 2017-10-10 MED ORDER — LORAZEPAM 1 MG PO TABS
1.0000 mg | ORAL_TABLET | Freq: Every day | ORAL | Status: DC | PRN
  Administered 2017-10-11: 1 mg via ORAL
  Filled 2017-10-10: qty 1

## 2017-10-10 MED ORDER — ONDANSETRON HCL 4 MG/2ML IJ SOLN: 4.0000 mg | Freq: Once | INTRAMUSCULAR | Status: DC | PRN

## 2017-10-10 MED ORDER — RINGERS IRRIGATION IR SOLN
Status: DC | PRN
  Administered 2017-10-10: 1

## 2017-10-10 MED ORDER — OXYCODONE HCL 5 MG PO TABS
5.0000 mg | ORAL_TABLET | ORAL | Status: DC | PRN
  Administered 2017-10-10 – 2017-10-11 (×5): 5 mg via ORAL
  Filled 2017-10-10 (×2): qty 1

## 2017-10-10 MED ORDER — DEXAMETHASONE SODIUM PHOSPHATE 10 MG/ML IJ SOLN
10.0000 mg | Freq: Once | INTRAMUSCULAR | Status: AC
  Administered 2017-10-11: 10 mg via INTRAVENOUS
  Filled 2017-10-10: qty 1

## 2017-10-10 MED ORDER — BISACODYL 10 MG RE SUPP: 10.0000 mg | Freq: Every day | RECTAL | Status: DC | PRN

## 2017-10-10 MED ORDER — PROPOFOL 10 MG/ML IV BOLUS
INTRAVENOUS | Status: AC
  Filled 2017-10-10: qty 20

## 2017-10-10 MED ORDER — FENTANYL CITRATE (PF) 100 MCG/2ML IJ SOLN
INTRAMUSCULAR | Status: AC
  Filled 2017-10-10: qty 2

## 2017-10-10 MED ORDER — SODIUM CHLORIDE 0.9 % IJ SOLN
INTRAMUSCULAR | Status: DC | PRN
  Administered 2017-10-10: 60 mL

## 2017-10-10 MED ORDER — FENTANYL CITRATE (PF) 100 MCG/2ML IJ SOLN
INTRAMUSCULAR | Status: DC | PRN
  Administered 2017-10-10 (×2): 50 ug via INTRAVENOUS

## 2017-10-10 MED ORDER — METHOCARBAMOL 500 MG PO TABS
500.0000 mg | ORAL_TABLET | Freq: Four times a day (QID) | ORAL | Status: DC | PRN
  Administered 2017-10-10 – 2017-10-11 (×3): 500 mg via ORAL
  Filled 2017-10-10 (×4): qty 1

## 2017-10-10 MED ORDER — DEXAMETHASONE SODIUM PHOSPHATE 10 MG/ML IJ SOLN
10.0000 mg | Freq: Once | INTRAMUSCULAR | Status: AC
  Administered 2017-10-10: 10 mg via INTRAVENOUS

## 2017-10-10 MED ORDER — BUPIVACAINE IN DEXTROSE 0.75-8.25 % IT SOLN
INTRATHECAL | Status: DC | PRN
  Administered 2017-10-10: 2 mL via INTRATHECAL

## 2017-10-10 MED ORDER — TRANEXAMIC ACID 1000 MG/10ML IV SOLN
1000.0000 mg | INTRAVENOUS | Status: AC
  Administered 2017-10-10: 1000 mg via INTRAVENOUS
  Filled 2017-10-10: qty 1100

## 2017-10-10 MED ORDER — LACTATED RINGERS IV SOLN
INTRAVENOUS | Status: DC
  Administered 2017-10-10 (×3): via INTRAVENOUS

## 2017-10-10 SURGICAL SUPPLY — 49 items
BAG DECANTER FOR FLEXI CONT (MISCELLANEOUS) ×2 IMPLANT
BAG ZIPLOCK 12X15 (MISCELLANEOUS) ×2 IMPLANT
BANDAGE ACE 6X5 VEL STRL LF (GAUZE/BANDAGES/DRESSINGS) ×2 IMPLANT
BLADE SAG 18X100X1.27 (BLADE) ×2 IMPLANT
BLADE SAW SGTL 11.0X1.19X90.0M (BLADE) ×2 IMPLANT
BOWL SMART MIX CTS (DISPOSABLE) ×2 IMPLANT
CAPT KNEE TOTAL 3 ATTUNE ×2 IMPLANT
CEMENT HV SMART SET (Cement) ×4 IMPLANT
COVER SURGICAL LIGHT HANDLE (MISCELLANEOUS) ×2 IMPLANT
CUFF TOURN SGL QUICK 34 (TOURNIQUET CUFF) ×1
CUFF TRNQT CYL 34X4X40X1 (TOURNIQUET CUFF) ×1 IMPLANT
DECANTER SPIKE VIAL GLASS SM (MISCELLANEOUS) ×2 IMPLANT
DRAPE U-SHAPE 47X51 STRL (DRAPES) ×2 IMPLANT
DRSG ADAPTIC 3X8 NADH LF (GAUZE/BANDAGES/DRESSINGS) ×2 IMPLANT
DRSG PAD ABDOMINAL 8X10 ST (GAUZE/BANDAGES/DRESSINGS) ×2 IMPLANT
DURAPREP 26ML APPLICATOR (WOUND CARE) ×2 IMPLANT
ELECT REM PT RETURN 15FT ADLT (MISCELLANEOUS) ×2 IMPLANT
EVACUATOR 1/8 PVC DRAIN (DRAIN) ×2 IMPLANT
GAUZE SPONGE 4X4 12PLY STRL (GAUZE/BANDAGES/DRESSINGS) ×2 IMPLANT
GLOVE BIO SURGEON STRL SZ7.5 (GLOVE) IMPLANT
GLOVE BIO SURGEON STRL SZ8 (GLOVE) ×2 IMPLANT
GLOVE BIOGEL PI IND STRL 6.5 (GLOVE) IMPLANT
GLOVE BIOGEL PI IND STRL 8 (GLOVE) ×1 IMPLANT
GLOVE BIOGEL PI INDICATOR 6.5 (GLOVE)
GLOVE BIOGEL PI INDICATOR 8 (GLOVE) ×1
GLOVE SURG SS PI 6.5 STRL IVOR (GLOVE) IMPLANT
GOWN STRL REUS W/TWL LRG LVL3 (GOWN DISPOSABLE) ×2 IMPLANT
GOWN STRL REUS W/TWL XL LVL3 (GOWN DISPOSABLE) IMPLANT
HANDPIECE INTERPULSE COAX TIP (DISPOSABLE) ×1
IMMOBILIZER KNEE 20 (SOFTGOODS) ×2
IMMOBILIZER KNEE 20 THIGH 36 (SOFTGOODS) ×1 IMPLANT
MANIFOLD NEPTUNE II (INSTRUMENTS) ×2 IMPLANT
NS IRRIG 1000ML POUR BTL (IV SOLUTION) ×2 IMPLANT
PACK TOTAL KNEE CUSTOM (KITS) ×2 IMPLANT
PAD ABD 8X10 STRL (GAUZE/BANDAGES/DRESSINGS) ×4 IMPLANT
PADDING CAST COTTON 6X4 STRL (CAST SUPPLIES) ×4 IMPLANT
POSITIONER SURGICAL ARM (MISCELLANEOUS) ×2 IMPLANT
SET HNDPC FAN SPRY TIP SCT (DISPOSABLE) ×1 IMPLANT
STRIP CLOSURE SKIN 1/2X4 (GAUZE/BANDAGES/DRESSINGS) ×2 IMPLANT
SUT MNCRL AB 4-0 PS2 18 (SUTURE) ×2 IMPLANT
SUT STRATAFIX 0 PDS 27 VIOLET (SUTURE) ×2
SUT VIC AB 2-0 CT1 27 (SUTURE) ×3
SUT VIC AB 2-0 CT1 TAPERPNT 27 (SUTURE) ×3 IMPLANT
SUTURE STRATFX 0 PDS 27 VIOLET (SUTURE) ×1 IMPLANT
SYR 30ML LL (SYRINGE) ×4 IMPLANT
TRAY FOLEY W/METER SILVER 16FR (SET/KITS/TRAYS/PACK) ×2 IMPLANT
WATER STERILE IRR 1000ML POUR (IV SOLUTION) ×4 IMPLANT
WRAP KNEE MAXI GEL POST OP (GAUZE/BANDAGES/DRESSINGS) ×2 IMPLANT
YANKAUER SUCT BULB TIP 10FT TU (MISCELLANEOUS) ×2 IMPLANT

## 2017-10-10 NOTE — Op Note (Signed)
OPERATIVE REPORT-TOTAL KNEE ARTHROPLASTY   Pre-operative diagnosis- Osteoarthritis  Left knee(s)  Post-operative diagnosis- Osteoarthritis Left knee(s)  Procedure-  Left  Total Knee Arthroplasty (Depuy Attune)  Surgeon- Dione Plover. Earlie Arciga, MD  Assistant- Arlee Muslim, PA-C   Anesthesia-  Adductor canal block and spinal  EBL-25 ml   Drains Hemovac  Tourniquet time-  Total Tourniquet Time Documented: Thigh (Left) - 36 minutes Total: Thigh (Left) - 36 minutes     Complications- None  Condition-PACU - hemodynamically stable.   Brief Clinical Note   Henry Knight is a 67 y.o. year old male with end stage OA of his left knee with progressively worsening pain and dysfunction. He has constant pain, with activity and at rest and significant functional deficits with difficulties even with ADLs. He has had extensive non-op management including analgesics, injections of cortisone, and home exercise program, but remains in significant pain with significant dysfunction. Radiographs show bone on bone arthritis medial and patellofemoral. He presents now for left Total Knee Arthroplasty.     Procedure in detail---   The patient is brought into the operating room and positioned supine on the operating table. After successful administration of  Adductor canal block and spinal,   a tourniquet is placed high on the  Left thigh(s) and the lower extremity is prepped and draped in the usual sterile fashion. Time out is performed by the operating team and then the  Left lower extremity is wrapped in Esmarch, knee flexed and the tourniquet inflated to 300 mmHg.       A midline incision is made with a ten blade through the subcutaneous tissue to the level of the extensor mechanism. A fresh blade is used to make a medial parapatellar arthrotomy. Soft tissue over the proximal medial tibia is subperiosteally elevated to the joint line with a knife and into the semimembranosus bursa with a Cobb  elevator. Soft tissue over the proximal lateral tibia is elevated with attention being paid to avoiding the patellar tendon on the tibial tubercle. The patella is everted, knee flexed 90 degrees and the ACL and PCL are removed. Findings are bone on bone medial and patellofemoral with large global osteophytes.        The drill is used to create a starting hole in the distal femur and the canal is thoroughly irrigated with sterile saline to remove the fatty contents. The 5 degree Left  valgus alignment guide is placed into the femoral canal and the distal femoral cutting block is pinned to remove 9 mm off the distal femur. Resection is made with an oscillating saw.      The tibia is subluxed forward and the menisci are removed. The extramedullary alignment guide is placed referencing proximally at the medial aspect of the tibial tubercle and distally along the second metatarsal axis and tibial crest. The block is pinned to remove 39mm off the more deficient medial  side. Resection is made with an oscillating saw. Size 7is the most appropriate size for the tibia and the proximal tibia is prepared with the modular drill and keel punch for that size.      The femoral sizing guide is placed and size 7 is most appropriate. Rotation is marked off the epicondylar axis and confirmed by creating a rectangular flexion gap at 90 degrees. The size 7 cutting block is pinned in this rotation and the anterior, posterior and chamfer cuts are made with the oscillating saw. The intercondylar block is then placed and that cut is  made.      Trial size 7 tibial component, trial size 7 posterior stabilized femur and a 10  mm posterior stabilized rotating platform insert trial is placed. Full extension is achieved with excellent varus/valgus and anterior/posterior balance throughout full range of motion. The patella is everted and thickness measured to be 27  mm. Free hand resection is taken to 15 mm, a 41 template is placed, lug holes  are drilled, trial patella is placed, and it tracks normally. Osteophytes are removed off the posterior femur with the trial in place. All trials are removed and the cut bone surfaces prepared with pulsatile lavage. Cement is mixed and once ready for implantation, the size 7 tibial implant, size  7 posterior stabilized femoral component, and the size 41 patella are cemented in place and the patella is held with the clamp. The trial insert is placed and the knee held in full extension. The Exparel (20 ml mixed with 60 ml saline) is injected into the extensor mechanism, posterior capsule, medial and lateral gutters and subcutaneous tissues.  All extruded cement is removed and once the cement is hard the permanent 10 mm posterior stabilized rotating platform insert is placed into the tibial tray.      The wound is copiously irrigated with saline solution and the extensor mechanism closed over a hemovac drain with #1 V-loc suture. The tourniquet is released for a total tourniquet time of 36  minutes. Flexion against gravity is 140 degrees and the patella tracks normally. Subcutaneous tissue is closed with 2.0 vicryl and subcuticular with running 4.0 Monocryl. The incision is cleaned and dried and steri-strips and a bulky sterile dressing are applied. The limb is placed into a knee immobilizer and the patient is awakened and transported to recovery in stable condition.      Please note that a surgical assistant was a medical necessity for this procedure in order to perform it in a safe and expeditious manner. Surgical assistant was necessary to retract the ligaments and vital neurovascular structures to prevent injury to them and also necessary for proper positioning of the limb to allow for anatomic placement of the prosthesis.   Dione Plover Kennan Detter, MD    10/10/2017, 9:44 AM

## 2017-10-10 NOTE — Anesthesia Postprocedure Evaluation (Signed)
Anesthesia Post Note  Patient: Henry Knight  Procedure(s) Performed: LEFT TOTAL KNEE ARTHROPLASTY (Left Knee)     Patient location during evaluation: PACU Anesthesia Type: Regional and Spinal Level of consciousness: oriented and awake and alert Pain management: pain level controlled Vital Signs Assessment: post-procedure vital signs reviewed and stable Respiratory status: spontaneous breathing, respiratory function stable and patient connected to nasal cannula oxygen Cardiovascular status: blood pressure returned to baseline and stable Postop Assessment: no headache, no backache, no apparent nausea or vomiting and spinal receding Anesthetic complications: no    Last Vitals:  Vitals:   10/10/17 1313 10/10/17 1413  BP: (!) 151/84 137/89  Pulse: 65 66  Resp: 16 17  Temp: (!) 36.4 C 36.4 C  SpO2: 99% 100%    Last Pain:  Vitals:   10/10/17 1440  TempSrc:   PainSc: 5                  Seniya Stoffers P Jaylen Claude

## 2017-10-10 NOTE — Anesthesia Preprocedure Evaluation (Addendum)
Anesthesia Evaluation  Patient identified by MRN, date of birth, ID band Patient awake    Reviewed: Allergy & Precautions, NPO status , Patient's Chart, lab work & pertinent test results  Airway Mallampati: II  TM Distance: >3 FB Neck ROM: Full    Dental no notable dental hx.    Pulmonary neg pulmonary ROS,    Pulmonary exam normal breath sounds clear to auscultation       Cardiovascular hypertension, Pt. on medications Normal cardiovascular exam Rhythm:Regular Rate:Normal  ECG: NSR, rate 62   Neuro/Psych Tremor negative psych ROS   GI/Hepatic negative GI ROS, Neg liver ROS,   Endo/Other  negative endocrine ROS  Renal/GU negative Renal ROS     Musculoskeletal  (+) Arthritis ,   Abdominal (+) + obese,   Peds  Hematology negative hematology ROS (+)   Anesthesia Other Findings   Reproductive/Obstetrics                            Anesthesia Physical Anesthesia Plan  ASA: II  Anesthesia Plan: Spinal and Regional   Post-op Pain Management:  Regional for Post-op pain   Induction: Intravenous  PONV Risk Score and Plan: 1 and Propofol infusion, Treatment may vary due to age or medical condition, Ondansetron and Midazolam  Airway Management Planned: Natural Airway  Additional Equipment:   Intra-op Plan:   Post-operative Plan: Extubation in OR  Informed Consent: I have reviewed the patients History and Physical, chart, labs and discussed the procedure including the risks, benefits and alternatives for the proposed anesthesia with the patient or authorized representative who has indicated his/her understanding and acceptance.   Dental advisory given  Plan Discussed with: CRNA  Anesthesia Plan Comments:         Anesthesia Quick Evaluation

## 2017-10-10 NOTE — Discharge Instructions (Addendum)
° °Dr. Frank Aluisio °Total Joint Specialist °[facility] °[address] °[place], [place] [address] °[phone] ° °TOTAL KNEE REPLACEMENT POSTOPERATIVE DIRECTIONS ° °Knee Rehabilitation, Guidelines Following Surgery  °Results after knee surgery are often greatly improved when you follow the exercise, range of motion and muscle strengthening exercises prescribed by your doctor. Safety measures are also important to protect the knee from further injury. Any time any of these exercises cause you to have increased pain or swelling in your knee joint, decrease the amount until you are comfortable again and slowly increase them. If you have problems or questions, call your caregiver or physical therapist for advice.  ° °HOME CARE INSTRUCTIONS  °Remove items at home which could result in a fall. This includes throw rugs or furniture in walking pathways.  °· ICE to the affected knee every three hours for 30 minutes at a time and then as needed for pain and swelling.  Continue to use ice on the knee for pain and swelling from surgery. You may notice swelling that will progress down to the foot and ankle.  This is normal after surgery.  Elevate the leg when you are not up walking on it.   °· Continue to use the breathing machine which will help keep your temperature down.  It is common for your temperature to cycle up and down following surgery, especially at night when you are not up moving around and exerting yourself.  The breathing machine keeps your lungs expanded and your temperature down. °· Do not place pillow under knee, focus on keeping the knee straight while resting ° °DIET °You may resume your previous home diet once your are discharged from the hospital. ° °DRESSING / WOUND CARE / SHOWERING °You may shower 3 days after surgery, but keep the wounds dry during showering.  You may use an occlusive plastic wrap (Press'n Seal for example), NO SOAKING/SUBMERGING IN THE BATHTUB.  If the  bandage gets wet, change with a clean dry gauze.  If the incision gets wet, pat the wound dry with a clean towel. °You may start showering once you are discharged home but do not submerge the incision under water. Just pat the incision dry and apply a dry gauze dressing on daily. °Change the surgical dressing daily and reapply a dry dressing each time. ° °ACTIVITY °Walk with your walker as instructed. °Use walker as long as suggested by your caregivers. °Avoid periods of inactivity such as sitting longer than an hour when not asleep. This helps prevent blood clots.  °You may resume a sexual relationship in one month or when given the OK by your doctor.  °You may return to work once you are cleared by your doctor.  °Do not drive a car for 6 weeks or until released by you surgeon.  °Do not drive while taking narcotics. ° °WEIGHT BEARING °Weight bearing as tolerated with assist device (walker, cane, etc) as directed, use it as long as suggested by your surgeon or therapist, typically at least 4-6 weeks. ° °POSTOPERATIVE CONSTIPATION PROTOCOL °Constipation - defined medically as fewer than three stools per week and severe constipation as less than one stool per week. ° °One of the most common issues patients have following surgery is constipation.  Even if you have a regular bowel pattern at home, your normal regimen is likely to be disrupted due to multiple reasons following surgery.  Combination of anesthesia, postoperative narcotics, change in appetite and fluid intake all can affect your bowels.    In order to avoid complications following surgery, here are some recommendations in order to help you during your recovery period. ° °Colace (docusate) - Pick up an over-the-counter form of Colace or another stool softener and take twice a day as long as you are requiring postoperative pain medications.  Take with a full glass of water daily.  If you experience loose stools or diarrhea, hold the colace until you stool forms  back up.  If your symptoms do not get better within 1 week or if they get worse, check with your doctor. ° °Dulcolax (bisacodyl) - Pick up over-the-counter and take as directed by the product packaging as needed to assist with the movement of your bowels.  Take with a full glass of water.  Use this product as needed if not relieved by Colace only.  ° °MiraLax (polyethylene glycol) - Pick up over-the-counter to have on hand.  MiraLax is a solution that will increase the amount of water in your bowels to assist with bowel movements.  Take as directed and can mix with a glass of water, juice, soda, coffee, or tea.  Take if you go more than two days without a movement. °Do not use MiraLax more than once per day. Call your doctor if you are still constipated or irregular after using this medication for 7 days in a row. ° °If you continue to have problems with postoperative constipation, please contact the office for further assistance and recommendations.  If you experience "the worst abdominal pain ever" or develop nausea or vomiting, please contact the office immediatly for further recommendations for treatment. ° °ITCHING ° If you experience itching with your medications, try taking only a single pain pill, or even half a pain pill at a time.  You can also use Benadryl over the counter for itching or also to help with sleep.  ° °TED HOSE STOCKINGS °Wear the elastic stockings on both legs for three weeks following surgery during the day but you may remove then at night for sleeping. ° °MEDICATIONS °See your medication summary on the “After Visit Summary” that the nursing staff will review with you prior to discharge.  You may have some home medications which will be placed on hold until you complete the course of blood thinner medication.  It is important for you to complete the blood thinner medication as prescribed by your surgeon.  Continue your approved medications as instructed at time of  discharge. ° °PRECAUTIONS °If you experience chest pain or shortness of breath - call 911 immediately for transfer to the hospital emergency department.  °If you develop a fever greater that 101 F, purulent drainage from wound, increased redness or drainage from wound, foul odor from the wound/dressing, or calf pain - CONTACT YOUR SURGEON.   °                                                °FOLLOW-UP APPOINTMENTS °Make sure you keep all of your appointments after your operation with your surgeon and caregivers. You should call the office at the above phone number and make an appointment for approximately two weeks after the date of your surgery or on the date instructed by your surgeon outlined in the "After Visit Summary". ° ° °RANGE OF MOTION AND STRENGTHENING EXERCISES  °Rehabilitation of the knee is important following a knee injury or   an operation. After just a few days of immobilization, the muscles of the thigh which control the knee become weakened and shrink (atrophy). Knee exercises are designed to build up the tone and strength of the thigh muscles and to improve knee motion. Often times heat used for twenty to thirty minutes before working out will loosen up your tissues and help with improving the range of motion but do not use heat for the first two weeks following surgery. These exercises can be done on a training (exercise) mat, on the floor, on a table or on a bed. Use what ever works the best and is most comfortable for you Knee exercises include:  °Leg Lifts - While your knee is still immobilized in a splint or cast, you can do straight leg raises. Lift the leg to 60 degrees, hold for 3 sec, and slowly lower the leg. Repeat 10-20 times 2-3 times daily. Perform this exercise against resistance later as your knee gets better.  °Quad and Hamstring Sets - Tighten up the muscle on the front of the thigh (Quad) and hold for 5-10 sec. Repeat this 10-20 times hourly. Hamstring sets are done by pushing the  foot backward against an object and holding for 5-10 sec. Repeat as with quad sets.  °· Leg Slides: Lying on your back, slowly slide your foot toward your buttocks, bending your knee up off the floor (only go as far as is comfortable). Then slowly slide your foot back down until your leg is flat on the floor again. °· Angel Wings: Lying on your back spread your legs to the side as far apart as you can without causing discomfort.  °A rehabilitation program following serious knee injuries can speed recovery and prevent re-injury in the future due to weakened muscles. Contact your doctor or a physical therapist for more information on knee rehabilitation.  ° °IF YOU ARE TRANSFERRED TO A SKILLED REHAB FACILITY °If the patient is transferred to a skilled rehab facility following release from the hospital, a list of the current medications will be sent to the facility for the patient to continue.  When discharged from the skilled rehab facility, please have the facility set up the patient's Home Health Physical Therapy prior to being released. Also, the skilled facility will be responsible for providing the patient with their medications at time of release from the facility to include their pain medication, the muscle relaxants, and their blood thinner medication. If the patient is still at the rehab facility at time of the two week follow up appointment, the skilled rehab facility will also need to assist the patient in arranging follow up appointment in our office and any transportation needs. ° °MAKE SURE YOU:  °Understand these instructions.  °Get help right away if you are not doing well or get worse.  ° ° °Pick up stool softner and laxative for home use following surgery while on pain medications. °Do not submerge incision under water. °Please use good hand washing techniques while changing dressing each day. °May shower starting three days after surgery. °Please use a clean towel to pat the incision dry following  showers. °Continue to use ice for pain and swelling after surgery. °Do not use any lotions or creams on the incision until instructed by your surgeon. ° °Take Xarelto for two and a half more weeks following discharge from the hospital, then discontinue Xarelto. °Once the patient has completed the blood thinner regimen, then take a Baby 81 mg Aspirin daily for three   more weeks. ° ° ° °Information on my medicine - XARELTO® (Rivaroxaban) ° °This medication education was reviewed with me or my healthcare representative as part of my discharge preparation.   ° °Why was Xarelto® prescribed for you? °Xarelto® was prescribed for you to reduce the risk of blood clots forming after orthopedic surgery. The medical term for these abnormal blood clots is venous thromboembolism (VTE). ° °What do you need to know about xarelto® ? °Take your Xarelto® ONCE DAILY at the same time every day. °You may take it either with or without food. ° °If you have difficulty swallowing the tablet whole, you may crush it and mix in applesauce just prior to taking your dose. ° °Take Xarelto® exactly as prescribed by your doctor and DO NOT stop taking Xarelto® without talking to the doctor who prescribed the medication.  Stopping without other VTE prevention medication to take the place of Xarelto® may increase your risk of developing a clot. ° °After discharge, you should have regular check-up appointments with your healthcare provider that is prescribing your Xarelto®.   ° °What do you do if you miss a dose? °If you miss a dose, take it as soon as you remember on the same day then continue your regularly scheduled once daily regimen the next day. Do not take two doses of Xarelto® on the same day.  ° °Important Safety Information °A possible side effect of Xarelto® is bleeding. You should call your healthcare provider right away if you experience any of the following: °? Bleeding from an injury or your nose that does not stop. °? Unusual colored  urine (red or dark brown) or unusual colored stools (red or black). °? Unusual bruising for unknown reasons. °? A serious fall or if you hit your head (even if there is no bleeding). ° °Some medicines may interact with Xarelto® and might increase your risk of bleeding while on Xarelto®. To help avoid this, consult your healthcare provider or pharmacist prior to using any new prescription or non-prescription medications, including herbals, vitamins, non-steroidal anti-inflammatory drugs (NSAIDs) and supplements. ° °This website has more information on Xarelto®: www.xarelto.com. ° ° °

## 2017-10-10 NOTE — Anesthesia Procedure Notes (Signed)
Procedure Name: MAC Date/Time: 10/10/2017 8:32 AM Performed by: West Pugh, CRNA Pre-anesthesia Checklist: Patient identified, Emergency Drugs available, Suction available, Patient being monitored and Timeout performed Patient Re-evaluated:Patient Re-evaluated prior to induction Oxygen Delivery Method: Simple face mask Placement Confirmation: CO2 detector and positive ETCO2 Dental Injury: Teeth and Oropharynx as per pre-operative assessment

## 2017-10-10 NOTE — Anesthesia Procedure Notes (Signed)
Spinal  Patient location during procedure: OR Start time: 10/10/2017 8:40 AM End time: 10/10/2017 8:50 AM Staffing Anesthesiologist: Murvin Natal, MD Performed: anesthesiologist  Preanesthetic Checklist Completed: patient identified, surgical consent, pre-op evaluation, timeout performed, IV checked, risks and benefits discussed and monitors and equipment checked Spinal Block Patient position: sitting Prep: DuraPrep Patient monitoring: cardiac monitor, continuous pulse ox and blood pressure Approach: left paramedian Location: L3-4 Injection technique: single-shot Needle Needle type: Quincke  Needle gauge: 22 G Needle length: 9 cm Assessment Sensory level: T10 Additional Notes Functioning IV was confirmed and monitors were applied. Sterile prep and drape, including hand hygiene and sterile gloves were used. The patient was positioned and the spine was prepped. The skin was anesthetized with lidocaine.  Free flow of clear CSF was obtained prior to injecting local anesthetic into the CSF.  The spinal needle aspirated freely following injection.  The needle was carefully withdrawn.  The patient tolerated the procedure well.

## 2017-10-10 NOTE — Transfer of Care (Signed)
Immediate Anesthesia Transfer of Care Note  Patient: Henry Knight  Procedure(s) Performed: LEFT TOTAL KNEE ARTHROPLASTY (Left Knee)  Patient Location: PACU  Anesthesia Type:Spinal and MAC combined with regional for post-op pain  Level of Consciousness: awake, alert  and patient cooperative  Airway & Oxygen Therapy: Patient Spontanous Breathing and Patient connected to nasal cannula oxygen  Post-op Assessment: Report given to RN and Post -op Vital signs reviewed and stable  Post vital signs: Reviewed and stable  Last Vitals:  Vitals:   10/10/17 0657  BP: (!) 171/106  Pulse: 66  Resp: 18  Temp: 36.9 C  SpO2: 99%    Last Pain:  Vitals:   10/10/17 0657  TempSrc: Oral  PainSc:       Patients Stated Pain Goal: 4 (08/04/27 1188)  Complications: No apparent anesthesia complications

## 2017-10-10 NOTE — Evaluation (Signed)
Physical Therapy Evaluation Patient Details Name: Henry Knight MRN: [id number] DOB: [birth year] Today's Date: 10/10/2017   History of Present Illness  s/p L TKA; Hx: R TKA ~6yrs ago  Clinical Impression  Pt is s/p TKA resulting in the deficits listed below (see PT Problem List).* Pt will benefit from skilled PT to increase their independence and safety with mobility to allow discharge to the venue listed below.  amb 50' with RW and  min assist, limited there ex d/t increasing pain;  Should progress well; pt is very concerned that he may have a hernia and spoke at length about this--he reports he told Dr. Wynelle Link as well    Follow Up Recommendations DC plan and follow up therapy as arranged by surgeon;Outpatient PT(OP at [facility])    Equipment Recommendations  None recommended by PT    Recommendations for Other Services       Precautions / Restrictions Precautions Precautions: Fall;Knee Precaution Comments: able to perform SLR x 2 however diminished quad control, KI utilized today;  pt reports knee buckling after RTKA (48yrs ago) Required Braces or Orthoses: Knee Immobilizer - Left Knee Immobilizer - Left: Discontinue once straight leg raise with < 10 degree lag Restrictions Weight Bearing Restrictions: No Other Position/Activity Restrictions: WBAT      Mobility  Bed Mobility Overal bed mobility: Needs Assistance Bed Mobility: Supine to Sit     Supine to sit: Min assist     General bed mobility comments: with LLE  Transfers Overall transfer level: Needs assistance Equipment used: Rolling walker (2 wheeled) Transfers: Sit to/from Stand Sit to Stand: Min assist         General transfer comment: assist to rise and steady during transition to RW; cues for hand placement and LLE position  Ambulation/Gait Ambulation/Gait assistance: Min assist Ambulation Distance (Feet): 50 Feet Assistive device: Rolling walker (2 wheeled) Gait Pattern/deviations: Step-to  pattern;Decreased stance time - left     General Gait Details: cues for sequence and RW safety  Stairs            Wheelchair Mobility    Modified Rankin (Stroke Patients Only)       Balance                                             Pertinent Vitals/Pain Pain Assessment: 0-10 Pain Score: 4  Pain Location: L knee Pain Descriptors / Indicators: Aching;Sore Pain Intervention(s): Monitored during session;RN gave pain meds during session;Premedicated before session;Ice applied;Repositioned    Home Living Family/patient expects to be discharged to:: Private residence Living Arrangements: Spouse/significant other(wife is a Pharmacist, hospital) Available Help at Discharge: Family;Neighbor Type of Home: House Home Access: Stairs to enter   [employer] of Steps: 2 at front, back 8 steps at back with one [place]: One level Home Equipment: [place] - 2 wheels;Cane - single point      Prior Function Level of Independence: Independent               Hand Dominance        Extremity/Trunk Assessment   Upper Extremity Assessment Upper Extremity Assessment: Defer to OT evaluation;Overall [facility] for tasks assessed    Lower Extremity Assessment Lower Extremity Assessment: LLE deficits/detail LLE Deficits / Details: ankle WFL; knee extension and hip flexion 2+/5       Communication   Communication: No difficulties  Cognition  Arousal/Alertness: Awake/alert Behavior During Therapy: WFL for tasks assessed/performed Overall Cognitive Status: Within Functional Limits for tasks assessed                                        General Comments      Exercises Total Joint Exercises Ankle Circles/Pumps: AROM;Both;10 reps Quad Sets: AROM;Both;5 reps   Assessment/Plan    PT Assessment Patient needs continued PT services  PT Problem List Decreased strength;Decreased activity tolerance;Decreased mobility;Decreased knowledge of  use of DME;Decreased safety awareness;Decreased knowledge of precautions;Decreased range of motion;Pain       PT Treatment Interventions DME instruction;Gait training;Functional mobility training;Therapeutic activities;Therapeutic exercise;Patient/family education;Stair training    PT Goals (Current goals can be found in the Care Plan section)  Acute Rehab PT Goals Patient Stated Goal: get better, back to swimming PT Goal Formulation: With patient Time For Goal Achievement: 10/17/17 Potential to Achieve Goals: Good    Frequency 7X/week   Barriers to discharge        Co-evaluation               AM-PAC PT "6 Clicks" Daily Activity  Outcome Measure Difficulty turning over in bed (including adjusting bedclothes, sheets and blankets)?: Unable Difficulty moving from lying on back to sitting on the side of the bed? : Unable Difficulty sitting down on and standing up from a chair with arms (e.g., wheelchair, bedside commode, etc,.)?: Unable Help needed moving to and from a bed to chair (including a wheelchair)?: A Little Help needed walking in hospital room?: A Little Help needed climbing 3-5 steps with a railing? : A Lot 6 Click Score: 11    End of Session Equipment Utilized During Treatment: Gait belt;Left knee immobilizer Activity Tolerance: Patient tolerated treatment well Patient left: in chair;with call bell/phone within reach   PT Visit Diagnosis: Difficulty in walking, not elsewhere classified (R26.2)    Time: 5102-5852 PT Time Calculation (min) (ACUTE ONLY): 42 min   Charges:   PT Evaluation $PT Eval Low Complexity: 1 Low PT Treatments $Gait Training: 8-22 mins $Therapeutic Activity: 8-22 mins   PT G CodesKenyon Ana, PT Pager: [phone] 10/10/2017   Kenyon Ana 10/10/2017, 3:12 PM

## 2017-10-10 NOTE — Anesthesia Procedure Notes (Signed)
Anesthesia Regional Block: Adductor canal block   Pre-Anesthetic Checklist: ,, timeout performed, Correct Patient, Correct Site, Correct Laterality, Correct Procedure,, site marked, risks and benefits discussed, Surgical consent,  Pre-op evaluation,  At surgeon's request and post-op pain management  Laterality: Left  Prep: chloraprep       Needles:  Injection technique: Single-shot  Needle Type: Echogenic Stimulator Needle     Needle Length: 9cm  Needle Gauge: 21     Additional Needles:   Procedures:,,,, ultrasound used (permanent image in chart),,,,  Narrative:  Start time: 10/10/2017 7:50 AM End time: 10/10/2017 8:00 AM Injection made incrementally with aspirations every 5 mL.  Performed by: Personally  Anesthesiologist: Murvin Natal, MD  Additional Notes: Functioning IV was confirmed and monitors were applied.  A 33mm 21ga Arrow echogenic stimulator needle was used. Sterile prep, hand hygiene and sterile gloves were used.  Negative aspiration and negative test dose prior to incremental administration of local anesthetic. The patient tolerated the procedure well.

## 2017-10-10 NOTE — Interval H&P Note (Signed)
History and Physical Interval Note:  10/10/2017 6:40 AM  Henry Knight  has presented today for surgery, with the diagnosis of Left knee osteoarthritis  The various methods of treatment have been discussed with the patient and family. After consideration of risks, benefits and other options for treatment, the patient has consented to  Procedure(s) with comments: LEFT TOTAL KNEE ARTHROPLASTY (Left) - 50 mins as a surgical intervention .  The patient's history has been reviewed, patient examined, no change in status, stable for surgery.  I have reviewed the patient's chart and labs.  Questions were answered to the patient's satisfaction.     Pilar Plate Zniya Cottone

## 2017-10-11 LAB — BASIC METABOLIC PANEL
Anion gap: 4 — ABNORMAL LOW (ref 5–15)
BUN: 14 mg/dL (ref 6–20)
CHLORIDE: 109 mmol/L (ref 101–111)
CO2: 25 mmol/L (ref 22–32)
Calcium: 8.5 mg/dL — ABNORMAL LOW (ref 8.9–10.3)
Creatinine, Ser: 0.72 mg/dL (ref 0.61–1.24)
GFR calc Af Amer: >60 mL/min (ref >60)
GFR calc non Af Amer: >60 mL/min (ref >60)
Glucose, Bld: 129 mg/dL — ABNORMAL HIGH (ref 65–99)
POTASSIUM: 4.2 mmol/L (ref 3.5–5.1)
SODIUM: 138 mmol/L (ref 135–145)

## 2017-10-11 LAB — CBC
HCT: 36.2 % — ABNORMAL LOW (ref 39.0–52.0)
HEMOGLOBIN: 12.4 g/dL — AB (ref 13.0–17.0)
MCH: 32.2 pg (ref 26.0–34.0)
MCHC: 34.3 g/dL (ref 30.0–36.0)
MCV: 94 fL (ref 78.0–100.0)
Platelets: 177 10*3/uL (ref 150–400)
RBC: 3.85 MIL/uL — AB (ref 4.22–5.81)
RDW: 12.4 % (ref 11.5–15.5)
WBC: 9.9 10*3/uL (ref 4.0–10.5)

## 2017-10-11 MED ORDER — METHOCARBAMOL 500 MG PO TABS: 500.0000 mg | ORAL_TABLET | Freq: Four times a day (QID) | ORAL | 0 refills | Status: DC | PRN

## 2017-10-11 MED ORDER — TRAMADOL HCL 50 MG PO TABS: 50.0000 mg | ORAL_TABLET | Freq: Four times a day (QID) | ORAL | 0 refills | Status: DC | PRN

## 2017-10-11 MED ORDER — OXYCODONE HCL 5 MG PO TABS: 5.0000 mg | ORAL_TABLET | ORAL | 0 refills | Status: DC | PRN

## 2017-10-11 MED ORDER — RIVAROXABAN 10 MG PO TABS: 10.0000 mg | ORAL_TABLET | Freq: Every day | ORAL | 0 refills | Status: DC

## 2017-10-11 NOTE — Progress Notes (Signed)
Physical Therapy Treatment Patient Details Name: Henry Knight MRN: [id number] DOB: November 24, 1950 Today's Date: 10/11/2017    History of Present Illness s/p L TKA; Hx: R TKA ~62yrs ago    PT Comments    POD # 1 am session Applied KI and instructed on use.  Assisted OOB to amb a greater distance in hallway.  Returned to room and performed some TKR TE's following HEP handout.  Instructed on proper tech, freq as well as use of ICE. Pt plans to D/C later today.  Will see again for stair training with spouse.    Follow Up Recommendations  DC plan and follow up therapy as arranged by surgeon;Outpatient PT     Equipment Recommendations  None recommended by PT    Recommendations for Other Services       Precautions / Restrictions Precautions Precautions: Fall;Knee Precaution Comments: instructed on KI use for amb and stairs until able to perform 10 active SLR Required Braces or Orthoses: Knee Immobilizer - Left Knee Immobilizer - Left: Discontinue once straight leg raise with < 10 degree lag Restrictions Weight Bearing Restrictions: No LLE Weight Bearing: Weight bearing as tolerated    Mobility  Bed Mobility Overal bed mobility: Needs Assistance Bed Mobility: Supine to Sit     Supine to sit: Min assist     General bed mobility comments: with LLE  Transfers Overall transfer level: Needs assistance Equipment used: Rolling walker (2 wheeled) Transfers: Sit to/from [facility] Sit to Stand: Supervision         General transfer comment: one VC on safety with turns  Ambulation/Gait Ambulation/Gait assistance: Min guard Ambulation Distance (Feet): 65 Feet Assistive device: Rolling walker (2 wheeled) Gait Pattern/deviations: Step-to pattern;Decreased stance time - left Gait velocity: decreased   General Gait Details: 25% VC's on proper walker to self distance and safety with turns   Financial trader Rankin  (Stroke Patients Only)       Balance                                            Cognition Arousal/Alertness: Awake/alert Behavior During Therapy: WFL for tasks assessed/performed Overall Cognitive Status: Within Functional Limits for tasks assessed                                        Exercises   Total Knee Replacement TE's 10 reps B LE ankle pumps 10 reps towel squeezes 10 reps knee presses 10 reps heel slides   Followed by ICE     General Comments        Pertinent Vitals/Pain Pain Assessment: 0-10 Pain Score: 6  Pain Location: L knee Pain Descriptors / Indicators: Aching;Sore;Operative site guarding Pain Intervention(s): Monitored during session;Repositioned;Ice applied    Home Living                      Prior Function            PT Goals (current goals can now be found in the care plan section) Progress towards PT goals: Progressing toward goals    Frequency    7X/week      PT Plan Current plan remains appropriate  Co-evaluation              AM-PAC PT "6 Clicks" Daily Activity  Outcome Measure  Difficulty turning over in bed (including adjusting bedclothes, sheets and blankets)?: Unable Difficulty moving from lying on back to sitting on the side of the bed? : Unable Difficulty sitting down on and standing up from a chair with arms (e.g., wheelchair, bedside commode, etc,.)?: Unable Help needed moving to and from a bed to chair (including a wheelchair)?: Total Help needed walking in hospital room?: Total Help needed climbing 3-5 steps with a railing? : Total 6 Click Score: 6    End of Session Equipment Utilized During Treatment: Gait belt;Left knee immobilizer Activity Tolerance: Patient tolerated treatment well Patient left: in chair;with call bell/phone within reach   PT Visit Diagnosis: Difficulty in walking, not elsewhere classified (R26.2)     Time: 9407-6808 PT Time Calculation  (min) (ACUTE ONLY): 24 min  Charges:  $Gait Training: 8-22 mins $Therapeutic Exercise: 8-22 mins                    G Codes:       Rica Koyanagi  PTA [facility]  Acute  Rehab Pager      [phone]

## 2017-10-11 NOTE — Evaluation (Signed)
Occupational Therapy Evaluation Patient Details Name: Henry Knight MRN: [id number] DOB: Aug 05, 1951 Today's Date: 10/11/2017    History of Present Illness s/p L TKA; Hx: R TKA ~74yrs ago   Clinical Impression   Pt admitted as above currently supervision-Min A for LB ADL's and functional transfers into shower. Pt has some DME & a/e and plans to d/c home with PRN family/spouse assist. No further acute OT needs identified at this time, will sign off.     Follow Up Recommendations  No OT follow up;Supervision - Intermittent    Equipment Recommendations  3 in 1 bedside commode    Recommendations for Other Services       Precautions / Restrictions Precautions Precautions: Fall;Knee Precaution Comments: able to perform SLR x 2 however diminished quad control, KI utilized today;  pt reports knee buckling after RTKA (31yrs ago) Required Braces or Orthoses: Knee Immobilizer - Left Knee Immobilizer - Left: Discontinue once straight leg raise with < 10 degree lag Restrictions Weight Bearing Restrictions: Yes LLE Weight Bearing: Weight bearing as tolerated Other Position/Activity Restrictions: WBAT      Mobility Bed Mobility Overal bed mobility: (Pt sitting up in chair upon OT arrival)                Transfers Overall transfer level: Needs assistance Equipment used: Rolling walker (2 wheeled) Transfers: Sit to/from [facility] Sit to Stand: Supervision Stand pivot transfers: Min guard       General transfer comment: VC's for hand placement    Balance                                           ADL either performed or assessed with clinical judgement   ADL Overall ADL's : Needs assistance/impaired Eating/Feeding: Independent;Sitting   Grooming: Set up;Sitting   Upper Body Bathing: Set up;Sitting   Lower Body Bathing: Minimal assistance;Sit to/from stand   Upper Body Dressing : Independent;Sitting   Lower Body Dressing:  Minimal assistance;Sit to/from stand Lower Body Dressing Details (indicate cue type and reason): Discussed use of LH reacher to don pants, doff socks/shoes etc. Toilet Transfer: Supervision/safety;RW;Ambulation;BSC Toilet Transfer Details (indicate cue type and reason): 3:1 over toilet Toileting- Clothing Manipulation and Hygiene: Supervision/safety;Sit to/from stand   Tub/ Banker: Walk-in shower;Anterior/posterior;Rolling walker;Ambulation;Supervision/safety   Functional mobility during ADLs: Rolling walker;Supervision/safety General ADL Comments: Pt was assessed by OT followed by ADL retraining session for toilet and shower transfers. Discussed LH A/E for LB dressing PRN. Pt spouse and family will assist PRN per his report for ADL's. He has some DME and LH reacher at home. He may sponge bathe initially or just wash his hair per his reports.     Vision Baseline Vision/History: Wears glasses Wears Glasses: Reading only Patient Visual Report: No change from baseline       Perception     Praxis      Pertinent Vitals/Pain Pain Assessment: 0-10 Pain Score: 6  Pain Location: L knee Pain Descriptors / Indicators: Aching;Sore Pain Intervention(s): Limited activity within patient's tolerance;Monitored during session;Premedicated before session;Repositioned;Patient requesting pain meds-RN notified     Hand Dominance Right   Extremity/Trunk Assessment Upper Extremity Assessment Upper Extremity Assessment: Overall WFL for tasks assessed   Lower Extremity Assessment Lower Extremity Assessment: Defer to PT evaluation       Communication Communication Communication: No difficulties   Cognition Arousal/Alertness: Awake/alert Behavior During  Therapy: WFL for tasks assessed/performed Overall Cognitive Status: Within Functional Limits for tasks assessed                                     General Comments       Exercises     Shoulder Instructions       Home Living Family/patient expects to be discharged to:: Private residence Living Arrangements: Spouse/significant other(Wife is a Pharmacist, hospital) Available Help at Discharge: Family;Neighbor Type of Home: House Home Access: Stairs to enter [employer] of Steps: 2 at front, back 8 steps at back with one [employer]: One level     Bathroom Shower/Tub: Tub/shower unit;Walk-in shower         Home Equipment: [place]ile - 2 wheels;Cane - single point;Hand held shower head;Bedside commode;Adaptive equipment Adaptive Equipment: Reacher        Prior Functioning/Environment Level of Independence: Independent                 OT Problem List: Pain      OT Treatment/Interventions:      OT Goals(Current goals can be found in the care plan section) Acute Rehab OT Goals Patient Stated Goal: Go home later today OT Goal Formulation: All assessment and education complete, DC therapy  OT Frequency:     Barriers to D/C:            Co-evaluation              AM-PAC PT "6 Clicks" Daily Activity     Outcome Measure Help from another person eating meals?: None Help from another person taking care of personal grooming?: A Little Help from another person toileting, which includes using toliet, bedpan, or urinal?: A Little Help from another person bathing (including washing, rinsing, drying)?: A Little Help from another person to put on and taking off regular upper body clothing?: None Help from another person to put on and taking off regular lower body clothing?: A Little 6 Click Score: 20   End of Session Equipment Utilized During Treatment: Gait belt;Rolling walker;Left knee immobilizer CPM Left Knee CPM Left Knee: Off CPM Right Knee CPM Right Knee: Off Nurse Communication: Patient requests pain meds  Activity Tolerance: Patient tolerated treatment well Patient left: in chair;with call bell/phone within reach;with nursing/sitter in room  OT Visit  Diagnosis: Pain Pain - Right/Left: Left Pain - part of body: Knee                Time: 7793-9030 OT Time Calculation (min): 25 min Charges:  OT General Charges $OT Visit: 1 Visit OT Evaluation $OT Eval Low Complexity: 1 Low OT Treatments $Self Care/Home Management : 8-22 mins G-Codes:      Josephine Igo Dixon, OTR/L 10/11/2017, 11:20 AM

## 2017-10-11 NOTE — Progress Notes (Signed)
   Subjective: 1 Day Post-Op Procedure(s) (LRB): LEFT TOTAL KNEE ARTHROPLASTY (Left) Patient reports pain as mild.   Patient seen in rounds for Dr. Wynelle Link. Patient is well, but has had some minor complaints of pain in the knee, requiring pain medications We will start therapy today.  If they do well with therapy and meets all goals, then will allow home later this afternoon following therapy. Plan is to go Home after hospital stay.  Objective: Vital signs in last 24 hours: Temp:  [97.5 F (36.4 C)-98.3 F (36.8 C)] 98.3 F (36.8 C) (01/08 0637) Pulse Rate:  [65-85] 75 (01/08 0637) Resp:  [9-18] 17 (01/08 0637) BP: (124-159)/(71-103) 133/80 (01/08 0637) SpO2:  [96 %-100 %] 98 % (01/08 0637)  Intake/Output from previous day:  Intake/Output Summary (Last 24 hours) at 10/11/2017 0925 Last data filed at 10/11/2017 0855 Gross per 24 hour  Intake 5405.01 ml  Output 4365 ml  Net 1040.01 ml    Intake/Output this shift: Total I/O In: 360 [P.O.:360] Out: -   Labs: Recent Labs    10/11/17 0607  HGB 12.4*   Recent Labs    10/11/17 0607  WBC 9.9  RBC 3.85*  HCT 36.2*  PLT 177   Recent Labs    10/11/17 0607  NA 138  K 4.2  CL 109  CO2 25  BUN 14  CREATININE 0.72  GLUCOSE 129*  CALCIUM 8.5*   No results for input(s): LABPT, INR in the last 72 hours.  EXAM General - Patient is Alert, Appropriate and Oriented Extremity - Neurovascular intact Sensation intact distally Intact pulses distally Dorsiflexion/Plantar flexion intact Dressing - dressing C/D/I Motor Function - intact, moving foot and toes well on exam.  Hemovac pulled without difficulty.  Past Medical History:  Diagnosis Date  . Arthritis   . BCC (basal cell carcinoma of skin)   . Diverticulosis 11/2014   Noted on 11/2014 colonoscopy: ascending, descending, and sigmoid  . Essential tremor   . History of adenomatous polyp of colon 2007; 2016   Recall 5 yrs (Dr. Benson [place])  . HTN (hypertension)   . Melanoma  ([place])    chest  . Neuromuscular disorder ([place])     Assessment/Plan: 1 Day Post-Op Procedure(s) (LRB): LEFT TOTAL KNEE ARTHROPLASTY (Left) Principal Problem:   OA (osteoarthritis) of knee  Estimated body mass index is 32.14 kg/m as calculated from the following:   Height as of this encounter: 6' (1.829 m).   Weight as of this encounter: 107.5 kg (237 lb). Up with therapy  DVT Prophylaxis - Xarelto Weight-Bearing as tolerated to left leg D/C O2 and Pulse OX and try on Room Air  If meets goals and able to go home: Up with therapy Diet - Cardiac diet Follow up - in 2 weeks Activity - WBAT Disposition - Home Condition Upon Discharge - Stable D/C Meds - See DC Summary DVT Prophylaxis - [place], PA-C Orthopaedic Surgery

## 2017-10-11 NOTE — Progress Notes (Signed)
Physical Therapy Treatment Patient Details Name: Henry Knight MRN: [id number] DOB: 07-27-1951 Today's Date: 10/11/2017    History of Present Illness s/p L TKA; Hx: R TKA ~32yr ago    PT Comments    POD # 1 pm session Spouse present for "hands on" instruction mobility and stairs. completed all TKR TE's following handout. Pt has met goals to D/C to home today.   Follow Up Recommendations  DC plan and follow up therapy as arranged by surgeon;Outpatient PT     Equipment Recommendations  None recommended by PT    Recommendations for Other Services       Precautions / Restrictions Precautions Precautions: Fall;Knee Precaution Comments: instructed on KI use for amb and stairs until able to perform 10 active SLR Required Braces or Orthoses: Knee Immobilizer - Left Knee Immobilizer - Left: Discontinue once straight leg raise with < 10 degree lag Restrictions Weight Bearing Restrictions: No LLE Weight Bearing: Weight bearing as tolerated    Mobility  Bed Mobility Overal bed mobility: Needs Assistance Bed Mobility: Supine to Sit     Supine to sit: Min assist     General bed mobility comments: OOB in recliner  Transfers Overall transfer level: Needs assistance Equipment used: Rolling walker (2 wheeled) Transfers: [facility] to Stand: Supervision         General transfer comment: one VC on safety with turns  Ambulation/Gait Ambulation/Gait assistance: Min guard Ambulation Distance (Feet): 65 Feet Assistive device: Rolling walker (2 wheeled) Gait Pattern/deviations: Step-to pattern;Decreased stance time - left Gait velocity: decreased   General Gait Details: 25% VC's on proper walker to self distance and safety with turns   Stairs Stairs: Yes   Stair Management: No rails;Step to pattern;Backwards;With walker Number of Stairs: 2 General stair comments: performed twice with spouse "hands on" up backward with walker at  50% VC's on proper sequencing and walker placement  Wheelchair Mobility    Modified Rankin (Stroke Patients Only)       Balance                                            Cognition Arousal/Alertness: Awake/alert Behavior During Therapy: WFL for tasks assessed/performed Overall Cognitive Status: Within Functional Limits for tasks assessed                                        Exercises      General Comments        Pertinent Vitals/Pain Pain Assessment: 0-10 Pain Score: 6  Pain Location: L knee Pain Descriptors / Indicators: Aching;Sore;Operative site guarding Pain Intervention(s): Monitored during session;Repositioned;Ice applied    Home Living                      Prior Function            PT Goals (current goals can now be found in the care plan section) Progress towards PT goals: Progressing toward goals    Frequency    7X/week      PT Plan Current plan remains appropriate    Co-evaluation              AM-PAC PT "6 Clicks" Daily Activity  Outcome Measure  Difficulty turning over in bed (including  adjusting bedclothes, sheets and blankets)?: Unable Difficulty moving from lying on back to sitting on the side of the bed? : Unable Difficulty sitting down on and standing up from a chair with arms (e.g., wheelchair, bedside commode, etc,.)?: Unable Help needed moving to and from a bed to chair (including a wheelchair)?: Total Help needed walking in hospital room?: Total Help needed climbing 3-5 steps with a railing? : Total 6 Click Score: 6    End of Session Equipment Utilized During Treatment: Gait belt;Left knee immobilizer Activity Tolerance: Patient tolerated treatment well Patient left: in chair;with call bell/phone within reach   PT Visit Diagnosis: Difficulty in walking, not elsewhere classified (R26.2)     Time: 0684-0335 PT Time Calculation (min) (ACUTE ONLY): 26 min  Charges:  $Gait  Training: 8-22 mins $Therapeutic Activity: 8-22 mins                    G Codes:       {Yan Pankratz  PTA [facility]  Acute  Rehab Pager      [phone]

## 2017-10-11 NOTE — Discharge Summary (Signed)
Physician Discharge Summary   Patient ID: Henry Knight MRN: [id number] DOB/AGE: 02-06-1951 67 y.o.  Admit date: 10/10/2017 Discharge date: 10-11-2017  Primary Diagnosis:  Osteoarthritis Left knee(s)   Admission Diagnoses:  Past Medical History:  Diagnosis Date  . Arthritis   . BCC (basal cell carcinoma of skin)   . Diverticulosis 11/2014   Noted on 11/2014 colonoscopy: ascending, descending, and sigmoid  . Essential tremor   . History of adenomatous polyp of colon 2007; 2016   Recall 5 yrs (Dr. Benson Norway)  . HTN (hypertension)   . Melanoma ([place])    chest  . Neuromuscular disorder [facility])    Discharge Diagnoses:   Principal Problem:   OA (osteoarthritis) of knee  Estimated body mass index is 32.14 kg/m as calculated from the following:   Height as of this encounter: 6' (1.829 m).   Weight as of this encounter: 107.5 kg (237 lb).  Procedure:  Procedure(s) (LRB): LEFT TOTAL KNEE ARTHROPLASTY (Left)   Consults: None  HPI: Henry Knight is a 67 y.o. year old male with end stage OA of his left knee with progressively worsening pain and dysfunction. He has constant pain, with activity and at rest and significant functional deficits with difficulties even with ADLs. He has had extensive non-op management including analgesics, injections of cortisone, and home exercise program, but remains in significant pain with significant dysfunction. Radiographs show bone on bone arthritis medial and patellofemoral. He presents now for left Total Knee Arthroplasty.    Laboratory Data: Admission on 10/10/2017  Component Date Value Ref Range Status  . WBC 10/11/2017 9.9  4.0 - 10.5 K/uL Final  . RBC 10/11/2017 3.85* 4.22 - 5.81 MIL/uL Final  . Hemoglobin 10/11/2017 12.4* 13.0 - 17.0 g/dL Final  . HCT 10/11/2017 36.2* 39.0 - 52.0 % Final  . MCV 10/11/2017 94.0  78.0 - 100.0 fL Final  . MCH 10/11/2017 32.2  26.0 - 34.0 pg Final  . MCHC 10/11/2017 34.3  30.0 - 36.0 g/dL Final  . RDW  10/11/2017 12.4  11.5 - 15.5 % Final  . Platelets 10/11/2017 177  150 - 400 K/uL Final  . Sodium 10/11/2017 138  135 - 145 mmol/L Final  . Potassium 10/11/2017 4.2  3.5 - 5.1 mmol/L Final  . Chloride 10/11/2017 109  101 - 111 mmol/L Final  . CO2 10/11/2017 25  22 - 32 mmol/L Final  . Glucose, Bld 10/11/2017 129* 65 - 99 mg/dL Final  . BUN 10/11/2017 14  6 - 20 mg/dL Final  . Creatinine, Ser 10/11/2017 0.72  0.61 - 1.24 mg/dL Final  . Calcium 10/11/2017 8.5* 8.9 - 10.3 mg/dL Final  . GFR calc non Af Amer 10/11/2017 >60  >60 mL/min Final  . GFR calc Af Amer 10/11/2017 >60  >60 mL/min Final   Comment: (NOTE) The eGFR has been calculated using the CKD EPI equation. This calculation has not been validated in all clinical situations. eGFR's persistently <60 mL/min signify possible Chronic Kidney Disease.   Georgiann Hahn gap 10/11/2017 4* 5 - 15 Final  Hospital Outpatient Visit on 10/06/2017  Component Date Value Ref Range Status  . aPTT 10/06/2017 29  24 - 36 seconds Final  . WBC 10/06/2017 4.7  4.0 - 10.5 K/uL Final  . RBC 10/06/2017 4.54  4.22 - 5.81 MIL/uL Final  . Hemoglobin 10/06/2017 15.0  13.0 - 17.0 g/dL Final  . HCT 10/06/2017 42.1  39.0 - 52.0 % Final  . MCV 10/06/2017 92.7  78.0 -  100.0 fL Final  . MCH 10/06/2017 33.0  26.0 - 34.0 pg Final  . MCHC 10/06/2017 35.6  30.0 - 36.0 g/dL Final  . RDW 10/06/2017 12.2  11.5 - 15.5 % Final  . Platelets 10/06/2017 188  150 - 400 K/uL Final  . Sodium 10/06/2017 137  135 - 145 mmol/L Final  . Potassium 10/06/2017 4.3  3.5 - 5.1 mmol/L Final  . Chloride 10/06/2017 107  101 - 111 mmol/L Final  . CO2 10/06/2017 25  22 - 32 mmol/L Final  . Glucose, Bld 10/06/2017 98  65 - 99 mg/dL Final  . BUN 10/06/2017 18  6 - 20 mg/dL Final  . Creatinine, Ser 10/06/2017 0.84  0.61 - 1.24 mg/dL Final  . Calcium 10/06/2017 9.0  8.9 - 10.3 mg/dL Final  . Total Protein 10/06/2017 6.9  6.5 - 8.1 g/dL Final  . Albumin 10/06/2017 4.0  3.5 - 5.0 g/dL Final  . AST  10/06/2017 30  15 - 41 U/L Final  . ALT 10/06/2017 28  17 - 63 U/L Final  . Alkaline Phosphatase 10/06/2017 63  38 - 126 U/L Final  . Total Bilirubin 10/06/2017 1.3* 0.3 - 1.2 mg/dL Final  . GFR calc non Af Amer 10/06/2017 >60  >60 mL/min Final  . GFR calc Af Amer 10/06/2017 >60  >60 mL/min Final   Comment: (NOTE) The eGFR has been calculated using the CKD EPI equation. This calculation has not been validated in all clinical situations. eGFR's persistently <60 mL/min signify possible Chronic Kidney Disease.   . Anion gap 10/06/2017 5  5 - 15 Final  . Prothrombin Time 10/06/2017 13.0  11.4 - 15.2 seconds Final  . INR 10/06/2017 0.99   Final  . ABO/RH(D) 10/06/2017 O NEG   Final  . Antibody Screen 10/06/2017 NEG   Final  . Sample Expiration 10/06/2017 10/13/2017   Final  . Extend sample reason 10/06/2017 NO TRANSFUSIONS OR PREGNANCY IN THE PAST 3 MONTHS   Final  . MRSA, PCR 10/06/2017 NEGATIVE  NEGATIVE Final  . Staphylococcus aureus 10/06/2017 NEGATIVE  NEGATIVE Final   Comment: (NOTE) The Xpert SA Assay (FDA approved for NASAL specimens in patients 62 years of age and older), is one component of a comprehensive surveillance program. It is not intended to diagnose infection nor to guide or monitor treatment.   . ABO/RH(D) 10/06/2017 O NEG   Final  Office Visit on 09/01/2017  Component Date Value Ref Range Status  . WBC 09/01/2017 4.4  4.0 - 10.5 K/uL Final  . RBC 09/01/2017 4.78  4.22 - 5.81 Mil/uL Final  . Hemoglobin 09/01/2017 15.6  13.0 - 17.0 g/dL Final  . HCT 09/01/2017 45.9  39.0 - 52.0 % Final  . MCV 09/01/2017 96.1  78.0 - 100.0 fl Final  . MCHC 09/01/2017 34.0  30.0 - 36.0 g/dL Final  . RDW 09/01/2017 12.4  11.5 - 15.5 % Final  . Platelets 09/01/2017 214.0  150.0 - 400.0 K/uL Final  . Neutrophils Relative % 09/01/2017 44.0  43.0 - 77.0 % Final  . Lymphocytes Relative 09/01/2017 43.0  12.0 - 46.0 % Final  . Monocytes Relative 09/01/2017 10.6  3.0 - 12.0 % Final  .  Eosinophils Relative 09/01/2017 1.8  0.0 - 5.0 % Final  . Basophils Relative 09/01/2017 0.6  0.0 - 3.0 % Final  . Neutro Abs 09/01/2017 1.9  1.4 - 7.7 K/uL Final  . Lymphs Abs 09/01/2017 1.9  0.7 - 4.0 K/uL Final  . Monocytes  Absolute 09/01/2017 0.5  0.1 - 1.0 K/uL Final  . Eosinophils Absolute 09/01/2017 0.1  0.0 - 0.7 K/uL Final  . Basophils Absolute 09/01/2017 0.0  0.0 - 0.1 K/uL Final  . Sodium 09/01/2017 138  135 - 145 mEq/L Final  . Potassium 09/01/2017 4.5  3.5 - 5.1 mEq/L Final  . Chloride 09/01/2017 104  96 - 112 mEq/L Final  . CO2 09/01/2017 27  19 - 32 mEq/L Final  . Glucose, Bld 09/01/2017 99  70 - 99 mg/dL Final  . BUN 09/01/2017 19  6 - 23 mg/dL Final  . Creatinine, Ser 09/01/2017 0.91  0.40 - 1.50 mg/dL Final  . Total Bilirubin 09/01/2017 0.9  0.2 - 1.2 mg/dL Final  . Alkaline Phosphatase 09/01/2017 65  39 - 117 U/L Final  . AST 09/01/2017 21  0 - 37 U/L Final  . ALT 09/01/2017 22  0 - 53 U/L Final  . Total Protein 09/01/2017 7.0  6.0 - 8.3 g/dL Final  . Albumin 09/01/2017 4.5  3.5 - 5.2 g/dL Final  . Calcium 09/01/2017 9.5  8.4 - 10.5 mg/dL Final  . GFR 09/01/2017 88.13  >60.00 mL/min Final  . Hgb A1c MFr Bld 09/01/2017 5.4  4.6 - 6.5 % Final   Glycemic Control Guidelines for People with Diabetes:Non Diabetic:  <6%Goal of Therapy: <7%Additional Action Suggested:  >8%      X-Rays:No results found.  EKG: Orders placed or performed during the hospital encounter of 10/06/17  . EKG 12-Lead  . EKG 12-Lead     Hospital Course: Henry Knight is a 67 y.o. who was admitted to [facility]. They were brought to the operating room on 10/10/2017 and underwent Procedure(s): LEFT TOTAL KNEE ARTHROPLASTY.  Patient tolerated the procedure well and was later transferred to the recovery room and then to the orthopaedic floor for postoperative care.  They were given PO and IV analgesics for pain control following their surgery.  They were given 24 hours of postoperative  antibiotics of  Anti-infectives (From admission, onward)   Start     Dose/Rate Route Frequency Ordered Stop   10/10/17 2000  vancomycin (VANCOCIN) IVPB 1000 mg/200 mL premix     1,000 mg 200 mL/hr over 60 Minutes Intravenous Every 12 hours 10/10/17 1126 10/10/17 2100   10/10/17 0600  vancomycin (VANCOCIN) 1,500 mg in sodium chloride 0.9 % 500 mL IVPB     1,500 mg 250 mL/hr over 120 Minutes Intravenous On call to O.R. 10/09/17 1119 10/10/17 0915     and started on DVT prophylaxis in the form of Xarelto.   PT and OT were ordered for total joint protocol.  Discharge planning consulted to help with postop disposition and equipment needs.  Patient had a good night on the evening of surgery and walked 50 that same day with therapy.  They started to get up OOB with therapy on day one. Hemovac drain was pulled without difficulty. Dressing looked good on day one. Patient was seen in rounds on POD 1 and was ready to go home later that same day following therapy.  Diet - Cardiac diet Follow up - in 2 weeks Activity - WBAT Disposition - Home Condition Upon Discharge - Stable D/C Meds - See DC Summary DVT Prophylaxis - Xarelto     Allergies as of 10/11/2017      Reactions   Penicillins Rash, Other (See Comments)   Has patient had a PCN reaction causing immediate rash, facial/tongue/throat swelling, SOB or lightheadedness  with hypotension: Unknown Has patient had a PCN reaction causing severe rash involving mucus membranes or skin necrosis: No Has patient had a PCN reaction that required hospitalization: No Has patient had a PCN reaction occurring within the last 10 years: No If all of the above answers are "NO", then may proceed with Cephalosporin use.      Medication List    STOP taking these medications   FISH OIL TRIPLE STRENGTH PO   ibuprofen 200 MG tablet Commonly known as:  ADVIL,MOTRIN   multivitamin capsule   Turmeric 500 MG Caps     TAKE these medications   acetaminophen 500  MG tablet Commonly known as:  TYLENOL Take 1,500 mg by mouth every 6 (six) hours as needed for moderate pain or headache.   hydrochlorothiazide 50 MG tablet Commonly known as:  HYDRODIURIL Take 1 tablet (50 mg total) by mouth daily.   LORazepam 1 MG tablet Commonly known as:  ATIVAN Take 1 tablet (1 mg total) by mouth daily as needed for anxiety.   methocarbamol 500 MG tablet Commonly known as:  ROBAXIN Take 1 tablet (500 mg total) by mouth every 6 (six) hours as needed for muscle spasms.   oxyCODONE 5 MG immediate release tablet Commonly known as:  Oxy IR/ROXICODONE Take 1 tablet (5 mg total) by mouth every 4 (four) hours as needed.   rivaroxaban 10 MG Tabs tablet Commonly known as:  XARELTO Take 1 tablet (10 mg total) by mouth daily with breakfast. Take Xarelto for two and a half more weeks following discharge from the hospital, then discontinue Xarelto. Once the patient has completed the blood thinner regimen, then take a Baby 81 mg Aspirin daily for three more weeks. Start taking on:  10/12/2017   traMADol 50 MG tablet Commonly known as:  ULTRAM Take 1-2 tablets (50-100 mg total) by mouth every 6 (six) hours as needed (mild pain). What changed:    how much to take  when to take this  reasons to take this      Follow-up Information    Gaynelle Arabian, MD. Schedule an appointment as soon as possible for a visit on 10/25/2017.   Specialty:  Orthopedic Surgery Contact information: [address] [place] [address] [phone]           Signed: Arlee Muslim, PA-C Orthopaedic Surgery 10/11/2017, 9:30 AM

## 2017-10-11 NOTE — Care Management CC44 (Signed)
Condition Code 44 Documentation Completed  Patient Details  Name: AARIC DOLPH MRN: [id number] Date of Birth: [birth year]   Condition Code 44 given:  Yes Patient signature on Condition Code 44 notice:  Yes Documentation of 2 MD's agreement:  Yes Code 44 added to claim:  Yes    Guadalupe Maple, RN 10/11/2017, 4:18 PM

## 2017-10-11 NOTE — Care Management Obs Status (Signed)
[place] NOTIFICATION   Patient Details  Name: Henry Knight MRN: [id number] Date of Birth: Jul 09, 1951   Medicare Observation Status Notification Given:  Yes    Guadalupe Maple, RN 10/11/2017, 4:18 PM

## 2017-10-14 DIAGNOSIS — M1712 Unilateral primary osteoarthritis, left knee: Secondary | ICD-10-CM | POA: Diagnosis not present

## 2017-10-15 DIAGNOSIS — M1712 Unilateral primary osteoarthritis, left knee: Secondary | ICD-10-CM | POA: Diagnosis not present

## 2017-10-18 DIAGNOSIS — M1712 Unilateral primary osteoarthritis, left knee: Secondary | ICD-10-CM | POA: Diagnosis not present

## 2017-10-20 DIAGNOSIS — M1712 Unilateral primary osteoarthritis, left knee: Secondary | ICD-10-CM | POA: Diagnosis not present

## 2017-10-22 DIAGNOSIS — M1712 Unilateral primary osteoarthritis, left knee: Secondary | ICD-10-CM | POA: Diagnosis not present

## 2017-10-25 DIAGNOSIS — M1712 Unilateral primary osteoarthritis, left knee: Secondary | ICD-10-CM | POA: Diagnosis not present

## 2017-10-27 DIAGNOSIS — M1712 Unilateral primary osteoarthritis, left knee: Secondary | ICD-10-CM | POA: Diagnosis not present

## 2017-10-27 DIAGNOSIS — Z96653 Presence of artificial knee joint, bilateral: Secondary | ICD-10-CM | POA: Insufficient documentation

## 2017-10-29 DIAGNOSIS — M1712 Unilateral primary osteoarthritis, left knee: Secondary | ICD-10-CM | POA: Diagnosis not present

## 2017-11-01 DIAGNOSIS — M1712 Unilateral primary osteoarthritis, left knee: Secondary | ICD-10-CM | POA: Diagnosis not present

## 2017-11-03 DIAGNOSIS — M1712 Unilateral primary osteoarthritis, left knee: Secondary | ICD-10-CM | POA: Diagnosis not present

## 2017-11-05 DIAGNOSIS — M1712 Unilateral primary osteoarthritis, left knee: Secondary | ICD-10-CM | POA: Diagnosis not present

## 2017-11-08 DIAGNOSIS — M1712 Unilateral primary osteoarthritis, left knee: Secondary | ICD-10-CM | POA: Diagnosis not present

## 2017-11-10 DIAGNOSIS — M1712 Unilateral primary osteoarthritis, left knee: Secondary | ICD-10-CM | POA: Diagnosis not present

## 2017-11-15 DIAGNOSIS — M1712 Unilateral primary osteoarthritis, left knee: Secondary | ICD-10-CM | POA: Diagnosis not present

## 2017-11-15 DIAGNOSIS — Z471 Aftercare following joint replacement surgery: Secondary | ICD-10-CM | POA: Diagnosis not present

## 2017-11-15 DIAGNOSIS — Z96652 Presence of left artificial knee joint: Secondary | ICD-10-CM | POA: Diagnosis not present

## 2017-11-17 DIAGNOSIS — M1712 Unilateral primary osteoarthritis, left knee: Secondary | ICD-10-CM | POA: Diagnosis not present

## 2018-03-22 ENCOUNTER — Encounter: Payer: Self-pay | Admitting: Family Medicine

## 2018-03-22 ENCOUNTER — Ambulatory Visit: Payer: Medicare Other | Admitting: Family Medicine

## 2018-03-22 VITALS — BP 144/85 | HR 68 | Temp 98.0°F | Resp 20 | Ht 72.0 in | Wt 238.0 lb

## 2018-03-22 DIAGNOSIS — Z79899 Other long term (current) drug therapy: Secondary | ICD-10-CM | POA: Diagnosis not present

## 2018-03-22 DIAGNOSIS — F4010 Social phobia, unspecified: Secondary | ICD-10-CM

## 2018-03-22 DIAGNOSIS — E669 Obesity, unspecified: Secondary | ICD-10-CM

## 2018-03-22 DIAGNOSIS — E781 Pure hyperglyceridemia: Secondary | ICD-10-CM

## 2018-03-22 DIAGNOSIS — I10 Essential (primary) hypertension: Secondary | ICD-10-CM | POA: Diagnosis not present

## 2018-03-22 DIAGNOSIS — H6121 Impacted cerumen, right ear: Secondary | ICD-10-CM

## 2018-03-22 DIAGNOSIS — M159 Polyosteoarthritis, unspecified: Secondary | ICD-10-CM

## 2018-03-22 DIAGNOSIS — M8949 Other hypertrophic osteoarthropathy, multiple sites: Secondary | ICD-10-CM

## 2018-03-22 DIAGNOSIS — M150 Primary generalized (osteo)arthritis: Secondary | ICD-10-CM

## 2018-03-22 DIAGNOSIS — G250 Essential tremor: Secondary | ICD-10-CM | POA: Diagnosis not present

## 2018-03-22 MED ORDER — CARBAMIDE PEROXIDE 6.5 % OT SOLN: 5.0000 [drp] | Freq: Two times a day (BID) | OTIC | 1 refills | Status: DC

## 2018-03-22 MED ORDER — TRAMADOL HCL 50 MG PO TABS: 50.0000 mg | ORAL_TABLET | Freq: Three times a day (TID) | ORAL | 1 refills | Status: DC | PRN

## 2018-03-22 MED ORDER — HYDROCHLOROTHIAZIDE 50 MG PO TABS: 50.0000 mg | ORAL_TABLET | Freq: Every day | ORAL | 1 refills | Status: DC

## 2018-03-22 MED ORDER — LORAZEPAM 1 MG PO TABS: 1.0000 mg | ORAL_TABLET | Freq: Every day | ORAL | 1 refills | Status: DC | PRN

## 2018-03-22 NOTE — Progress Notes (Signed)
Henry Knight , [birth year], 67 y.o., male MRN: [id number] Patient Care Team    Relationship Specialty Notifications Start End  Ma Hillock, DO PCP - General Family Medicine  07/11/15   Carol Ada, MD Consulting Physician Gastroenterology  08/11/16   Gaynelle Arabian, MD Consulting Physician Orthopedic Surgery  08/11/16   Melida Quitter, MD Consulting Physician Otolaryngology  08/11/16   Health, [facility]    10/13/16    Comment: plastic and reconstructiion [facility] (on [name] in [place])  Lavonna Monarch, MD Consulting Physician Dermatology  06/30/17     Chief Complaint  Patient presents with  . Hypertension    Subjective:  Hypertension/HLD:  Pt has had multiple OV with borderline to above average BP readings. He agreed to HCTZ start again, he has been noncompliant many times.  he took for a little while, but quite taking it when he had surgery. Patient denies chest pain, shortness of breath, dizziness or lower extremity edema.  He stopped taking fenofibrate and fish oil as well. He refuses a statin. He has a fhx heart disease.   Arthritis/chronic pain: pt reports compliance with tramadol and ativan today.  He does feel his pain is improved since having his knee surgery.   Indication for chronic opioid: osteoarthiritis Medication and dose: Tramadol 50 Mg QD PRN for pain  # pills per 90d: #120 Last UDS date: never, does not use routinely enough to UDS Pain contract signed (Y/N): Y Date narcotic database last reviewed (include red flags): Y, 03/22/18   Essential tremor/anxiety: Pt has h/o essential tremor and is prescribed ativan 1 mg QD PRN. He has used the medication for years and reports no side effects from medicine. He does need refills today on his ativan.  He is compliant with his medication.  Allergies  Allergen Reactions  . Penicillins Rash and Other (See Comments)    Has patient had a PCN reaction causing immediate rash, facial/tongue/throat swelling, SOB or  lightheadedness with hypotension: Unknown Has patient had a PCN reaction causing severe rash involving mucus membranes or skin necrosis: No Has patient had a PCN reaction that required hospitalization: No Has patient had a PCN reaction occurring within the last 10 years: No If all of the above answers are "NO", then may proceed with Cephalosporin use.    Social History   Tobacco Use  . Smoking status: Never Smoker  . Smokeless tobacco: Never Used  Substance Use Topics  . Alcohol use: Yes    Alcohol/week: 4.8 oz    Types: 8 Glasses of wine per week    Comment: Weekend   Past Medical History:  Diagnosis Date  . Arthritis   . BCC (basal cell carcinoma of skin)   . Diverticulosis 11/2014   Noted on 11/2014 colonoscopy: ascending, descending, and sigmoid  . Essential tremor   . History of adenomatous polyp of colon 2007; 2016   Recall 5 yrs (Dr. Benson Norway)  . HTN (hypertension)   . Melanoma ([place])    chest  . Neuromuscular disorder [facility])    Past Surgical History:  Procedure Laterality Date  . COLONOSCOPY W/ POLYPECTOMY  approx 2007; 11/2014   Dr. Marcy Panning 5 yrs   . COSMETIC SURGERY     laser peels, [facility] Plastic and reconstructive surgery Clearence Cheek) for acne scars  . KNEE SURGERY Right 2012  . SHOULDER ARTHROSCOPY W/ ROTATOR CUFF REPAIR  2004  . TOTAL KNEE ARTHROPLASTY Left 10/10/2017   Procedure: LEFT TOTAL KNEE ARTHROPLASTY;  Surgeon:  Gaynelle Arabian, MD;  Location: [facility] ORS;  Service: Orthopedics;  Laterality: Left;  50 mins   Family History  Problem Relation Age of Onset  . Heart disease Father   . Alcohol abuse Father   . Obesity Brother   . Asperger's syndrome Brother   . Obesity Mother   . Tremor Mother   . Anxiety disorder Mother    Allergies as of 03/22/2018      Reactions   Penicillins Rash, Other (See Comments)   Has patient had a PCN reaction causing immediate rash, facial/tongue/throat swelling, SOB or lightheadedness with hypotension: Unknown Has patient had  a PCN reaction causing severe rash involving mucus membranes or skin necrosis: No Has patient had a PCN reaction that required hospitalization: No Has patient had a PCN reaction occurring within the last 10 years: No If all of the above answers are "NO", then may proceed with Cephalosporin use.      Medication List        Accurate as of 03/22/18  8:28 AM. Always use your most recent med list.          acetaminophen 500 MG tablet Commonly known as:  TYLENOL Take 1,500 mg by mouth every 6 (six) hours as needed for moderate pain or headache.   hydrochlorothiazide 50 MG tablet Commonly known as:  HYDRODIURIL Take 1 tablet (50 mg total) by mouth daily.   LORazepam 1 MG tablet Commonly known as:  ATIVAN Take 1 tablet (1 mg total) by mouth daily as needed for anxiety.   traMADol 50 MG tablet Commonly known as:  ULTRAM Take 1-2 tablets (50-100 mg total) by mouth every 6 (six) hours as needed (mild pain).       No results found for this or any previous visit (from the past 24 hour(s)). No results found.   ROS: Negative, with the exception of above mentioned in HPI   Objective:  BP (!) 144/85 (BP Location: Right Arm, Patient Position: Sitting, Cuff Size: Large)   Pulse 68   Temp 98 F (36.7 C)   Resp 20   Ht 6' (1.829 m)   Wt 238 lb (108 kg)   SpO2 98%   BMI 32.28 kg/m  Body mass index is 32.28 kg/m. Gen: Afebrile. No acute distress.  HENT: AT. [place]. Bilateral TM unable to be visualized secondary to cerumen impaction. MMM.  No cough or hoarseness. Eyes:Pupils Equal Round Reactive to light, Extraocular movements intact,  Conjunctiva without redness, discharge or icterus. Neck/lymp/endocrine: Supple, no lymphadenopathy, no thyromegaly CV: RRR no murmur, no edema, +2/4 P posterior tibialis pulses Chest: CTAB, no wheeze or crackles Abd: Soft. NTND. BS present.  No masses palpated.  MSK: Vertical incision left knee healing well.  Good range of motion.  Neurovascularly  intact. Skin: No rashes, purpura or petechiae.  Neuro:  Normal gait. PERLA. EOMi. Alert. Oriented x3  Assessment/Plan: Henry Knight is a 67 y.o. male present for BP follow up  Essential hypertension/Hypertriglyceridemia -Blood pressure not at goal.  Patient not taking medication.  Patient encouraged to take medication.  Patient is willing to restart medication.  He has been counseled multiple times on effects of uncontrolled blood pressure on the body. - he had a good response to fenofibrate, but stopped - He has refused a statin.  - Diet and  exercise recommendations discussed.  -Encouraged continue  fenofibrate  -Encouraged to continue  hydrochlorothiazide (HYDRODIURIL) 50 MG tablet F/U 6 months  Primary osteoarthritis involving multiple joints -Stable.  Doing  rather well since having his knee surgery. - We will continue the opioid/controlled substance monitoring program, this consists of regular clinic visits, examinations, urine drug screen as well as use of [place] Controlled Substance Reporting System (which is made part of the permanent chart). Patient is aware and in agreement to routine face-to-face visits, by this provider only, for all evaluations and refills for current prescribe controlled substance.  -[place] controlled substance database was reviewed today. - Continue traMADol refills provided today for 6 months  Essential tremor/anxiety -Stable.  Refills completed for 6 months on lorazepam. - Continue LORazepam (ATIVAN) 1 MG tablet QD PRN, #90, 1 refill.  We will continue the  Controlled substance monitoring program, this consists of regular clinic visits, examinations, urine drug screen, pill counts as well as use of [place] Controlled Substance Reporting System (which is made part of the permanent chart). Patient is aware and in agreement to routine face-to-face visits, by this provider only, for all evaluations and refills for current prescribe  controlled substance.   Cerumen impaction: Debrox prescribed.  F/U 6 months   electronically signed by:  Howard Pouch, DO  [place]

## 2018-03-22 NOTE — Patient Instructions (Addendum)
I have refilled your meds today, please start your Blood pressure medicine.  Goal BP: < 130/80 ideally.   Low sodium and exercise is helpful also.

## 2018-03-28 ENCOUNTER — Encounter: Payer: Self-pay | Admitting: Family Medicine

## 2018-03-29 DIAGNOSIS — D229 Melanocytic nevi, unspecified: Secondary | ICD-10-CM | POA: Diagnosis not present

## 2018-03-29 DIAGNOSIS — L570 Actinic keratosis: Secondary | ICD-10-CM | POA: Diagnosis not present

## 2018-03-29 DIAGNOSIS — Z85820 Personal history of malignant melanoma of skin: Secondary | ICD-10-CM | POA: Diagnosis not present

## 2018-07-19 ENCOUNTER — Ambulatory Visit: Payer: Medicare Other | Admitting: Family Medicine

## 2018-07-19 ENCOUNTER — Encounter: Payer: Self-pay | Admitting: Family Medicine

## 2018-07-19 VITALS — BP 132/89 | HR 61 | Temp 98.1°F | Resp 20 | Ht 72.0 in | Wt 235.2 lb

## 2018-07-19 DIAGNOSIS — Z23 Encounter for immunization: Secondary | ICD-10-CM | POA: Diagnosis not present

## 2018-07-19 DIAGNOSIS — J069 Acute upper respiratory infection, unspecified: Secondary | ICD-10-CM | POA: Diagnosis not present

## 2018-07-19 MED ORDER — AZITHROMYCIN 250 MG PO TABS: ORAL_TABLET | ORAL | 0 refills | Status: DC

## 2018-07-19 NOTE — Patient Instructions (Signed)
Mucinex (plain) to help decrease secretions.  Use your flonase daily.   I have printed a script for a z-pack for you to start only if your symptoms worsen or not resolved in a week.   Make a physical appt for Dec/Jan.

## 2018-07-19 NOTE — Progress Notes (Signed)
Henry Knight , 1951-04-08, 67 y.o., male MRN: [id number] Patient Care Team    Relationship Specialty Notifications Start End  Ma Hillock, DO PCP - General Family Medicine  07/11/15   Carol Ada, MD Consulting Physician Gastroenterology  08/11/16   Gaynelle Arabian, MD Consulting Physician Orthopedic Surgery  08/11/16   Melida Quitter, MD Consulting Physician Otolaryngology  08/11/16   Sciences, [facility] (Inactive)    10/13/16    Comment: plastic and reconstructiion [facility] (on [name] in [place])  Lavonna Monarch, MD Consulting Physician Dermatology  06/30/17     Chief Complaint  Patient presents with  . URI     Subjective: Pt presents for an OV with complaints of URI  symptoms of 4 weeks duration.  Associated symptoms included cough, sore throat, runny nose, headache and fatigue. His wife was also ill at the time. He has slowly improved, took OTC NSAIDS for relief. He continues to have mild cough and runny nose. He is unable to tolerate oral antihistamines. He eating and drinking well.  No fever or chills. Allergic to PCN.  Depression screen [facility] 2/9 03/22/2018 08/11/2016 11/04/2015 11/04/2015  Decreased Interest 0 0 0 0  Down, Depressed, Hopeless 0 0 0 0  PHQ - 2 Score 0 0 0 0    Allergies  Allergen Reactions  . Penicillins Rash and Other (See Comments)    Has patient had a PCN reaction causing immediate rash, facial/tongue/throat swelling, SOB or lightheadedness with hypotension: Unknown Has patient had a PCN reaction causing severe rash involving mucus membranes or skin necrosis: No Has patient had a PCN reaction that required hospitalization: No Has patient had a PCN reaction occurring within the last 10 years: No If all of the above answers are "NO", then may proceed with Cephalosporin use.    Social History   Tobacco Use  . Smoking status: Never Smoker  . Smokeless tobacco: Never Used  Substance Use Topics  . Alcohol use: Yes    Alcohol/week: 8.0  standard drinks    Types: 8 Glasses of wine per week    Comment: Weekend   Past Medical History:  Diagnosis Date  . Arthritis   . BCC (basal cell carcinoma of skin)   . Diverticulosis 11/2014   Noted on 11/2014 colonoscopy: ascending, descending, and sigmoid  . Essential tremor   . History of adenomatous polyp of colon 2007; 2016   Recall 5 yrs (Dr. Benson Norway)  . HTN (hypertension)   . Melanoma ([place])    chest  . Neuromuscular disorder [facility])    Past Surgical History:  Procedure Laterality Date  . COLONOSCOPY W/ POLYPECTOMY  approx 2007; 11/2014   Dr. Marcy Panning 5 yrs   . COSMETIC SURGERY     laser peels, [facility] Plastic [facility] reconstructive surgery [facility]) for acne scars  . KNEE SURGERY Right 2012  . SHOULDER ARTHROSCOPY W/ ROTATOR CUFF REPAIR  2004  . 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 Heart disease Father   . Alcohol abuse Father   . Obesity Brother   . Asperger's syndrome Brother   . Obesity Mother   . Tremor Mother   . Anxiety disorder Mother    Allergies as of 07/19/2018      Reactions   Penicillins Rash, Other (  See Comments)   Has patient had a PCN reaction causing immediate rash, facial/tongue/throat swelling, SOB or lightheadedness with hypotension: Unknown Has patient had a PCN reaction causing severe rash involving mucus membranes or skin necrosis: No Has patient had a PCN reaction that required hospitalization: No Has patient had a PCN reaction occurring within the last 10 years: No If all of the above answers are "NO", then may proceed with Cephalosporin use.      Medication List        Accurate as of 07/19/18 10:54 AM. Always use your most recent med list.          azithromycin 250 MG tablet Commonly known as:  ZITHROMAX 500 mg day 1, then 250 mg a day     carbamide peroxide 6.5 % OTIC solution Commonly known as:  DEBROX Place 5 drops into the right ear 2 (two) times daily.   hydrochlorothiazide 50 MG tablet Commonly known as:  HYDRODIURIL Take 1 tablet (50 mg total) by mouth daily.   LORazepam 1 MG tablet Commonly known as:  ATIVAN Take 1 tablet (1 mg total) by mouth daily as needed for anxiety.   traMADol 50 MG tablet Commonly known as:  ULTRAM Take 1-2 tablets (50-100 mg total) by mouth every 8 (eight) hours as needed (mild pain).       All past medical history, surgical history, allergies, family history, immunizations andmedications were updated in the EMR today and reviewed under the history and medication portions of their EMR.     ROS: Negative, with the exception of above mentioned in HPI   Objective:  BP 132/89 (BP Location: Left Arm, Patient Position: Sitting, Cuff Size: Large)   Pulse 61   Temp 98.1 F (36.7 C)   Resp 20   Ht 6' (1.829 m)   Wt 235 lb 4 oz (106.7 kg)   SpO2 96%   BMI 31.91 kg/m  Body mass index is 31.91 kg/m. Gen: Afebrile. No acute distress. Nontoxic in appearance, well developed, well nourished.  HENT: AT. [place]. Bilateral TM visualized without erythema or fullness. MMM, no oral lesions. Bilateral nares with erythema and drainage. Throat without erythema or exudates. Mild cough and hoarseness present.  Eyes:Pupils Equal Round Reactive to light, Extraocular movements intact,  Conjunctiva without redness, discharge or icterus. Neck/lymp/endocrine: Supple,no lymphadenopathy CV: RRR no murmur, no edema Chest: CTAB, no wheeze or crackles. Good air movement, normal resp effort.  Abd: Soft. NTND. BS present.  Skin: no rashes, purpura or petechiae.  Neuro: Normal gait. PERLA. EOMi. Alert. Oriented x3   No exam data present No results found. No results found for this or any previous visit (from the past 24 hour(s)).  Assessment/Plan: Henry Knight is a 67 y.o. male present for OV for   Viral upper respiratory tract infection Rest, hydrate.  + flonase, mucinex , nettie pot or nasal saline encouraged. He is improving. Will hold on abx unless symptoms are still present in 1-2 weeks or if symptoms worsen.  Z-pack printed to start if worsening or not improved next week, take until completed if started.  If cough present it can last up to 6-8 weeks.  F/U 2 weeks of not improved. '  Flu shot provided today.     Reviewed expectations re: course of current medical issues.  Discussed self-management of symptoms.  Outlined signs and symptoms indicating need for more acute intervention.  Patient verbalized understanding and all questions were answered.  Patient received an After-Visit Summary.  Orders Placed This Encounter  Procedures  . Flu vaccine HIGH DOSE PF (Fluzone High dose)     Note is dictated utilizing voice recognition software. Although note has been proof read prior to signing, occasional typographical errors still can be missed. If any questions arise, please do not hesitate to call for verification.   electronically signed by:  Howard Pouch, DO  [place]

## 2018-07-20 ENCOUNTER — Ambulatory Visit: Payer: Medicare Other | Admitting: Family Medicine

## 2018-09-14 ENCOUNTER — Other Ambulatory Visit: Payer: Self-pay

## 2018-09-14 DIAGNOSIS — I10 Essential (primary) hypertension: Secondary | ICD-10-CM

## 2018-09-14 DIAGNOSIS — E781 Pure hyperglyceridemia: Secondary | ICD-10-CM

## 2018-09-14 MED ORDER — HYDROCHLOROTHIAZIDE 50 MG PO TABS
50.0000 mg | ORAL_TABLET | Freq: Every day | ORAL | 0 refills | Status: DC
Start: 2018-09-14 — End: 2018-10-13

## 2018-10-03 DIAGNOSIS — M7061 Trochanteric bursitis, right hip: Secondary | ICD-10-CM | POA: Diagnosis not present

## 2018-10-09 DIAGNOSIS — M545 Low back pain: Secondary | ICD-10-CM | POA: Diagnosis not present

## 2018-10-09 DIAGNOSIS — M47817 Spondylosis without myelopathy or radiculopathy, lumbosacral region: Secondary | ICD-10-CM | POA: Diagnosis not present

## 2018-10-13 ENCOUNTER — Other Ambulatory Visit: Payer: Self-pay

## 2018-10-13 DIAGNOSIS — I10 Essential (primary) hypertension: Secondary | ICD-10-CM

## 2018-10-13 DIAGNOSIS — E781 Pure hyperglyceridemia: Secondary | ICD-10-CM

## 2018-10-13 MED ORDER — HYDROCHLOROTHIAZIDE 50 MG PO TABS
50.0000 mg | ORAL_TABLET | Freq: Every day | ORAL | 0 refills | Status: DC
Start: 2018-10-13 — End: 2018-11-16

## 2018-10-13 NOTE — Telephone Encounter (Signed)
Pt pharmacy request a rf on his Hydrocholorothiazide. Pt was seen in 03/2018, and told to follow up in 6 mos. Will refill #30 w no rfs and pt notified need an appt.

## 2018-11-02 DIAGNOSIS — M545 Low back pain: Secondary | ICD-10-CM | POA: Diagnosis not present

## 2018-11-02 DIAGNOSIS — M47817 Spondylosis without myelopathy or radiculopathy, lumbosacral region: Secondary | ICD-10-CM | POA: Diagnosis not present

## 2018-11-03 DIAGNOSIS — Z471 Aftercare following joint replacement surgery: Secondary | ICD-10-CM | POA: Diagnosis not present

## 2018-11-03 DIAGNOSIS — Z96652 Presence of left artificial knee joint: Secondary | ICD-10-CM | POA: Diagnosis not present

## 2018-11-03 DIAGNOSIS — M7061 Trochanteric bursitis, right hip: Secondary | ICD-10-CM | POA: Diagnosis not present

## 2018-11-16 ENCOUNTER — Encounter: Payer: Self-pay | Admitting: Family Medicine

## 2018-11-16 ENCOUNTER — Ambulatory Visit (INDEPENDENT_AMBULATORY_CARE_PROVIDER_SITE_OTHER): Payer: Medicare Other | Admitting: Family Medicine

## 2018-11-16 VITALS — BP 111/81 | HR 91 | Temp 97.6°F | Resp 16 | Ht 72.5 in | Wt 230.2 lb

## 2018-11-16 DIAGNOSIS — E781 Pure hyperglyceridemia: Secondary | ICD-10-CM

## 2018-11-16 DIAGNOSIS — G250 Essential tremor: Secondary | ICD-10-CM | POA: Diagnosis not present

## 2018-11-16 DIAGNOSIS — E669 Obesity, unspecified: Secondary | ICD-10-CM

## 2018-11-16 DIAGNOSIS — Z0001 Encounter for general adult medical examination with abnormal findings: Secondary | ICD-10-CM | POA: Diagnosis not present

## 2018-11-16 DIAGNOSIS — Z125 Encounter for screening for malignant neoplasm of prostate: Secondary | ICD-10-CM | POA: Diagnosis not present

## 2018-11-16 DIAGNOSIS — Z79899 Other long term (current) drug therapy: Secondary | ICD-10-CM | POA: Diagnosis not present

## 2018-11-16 DIAGNOSIS — M8949 Other hypertrophic osteoarthropathy, multiple sites: Secondary | ICD-10-CM

## 2018-11-16 DIAGNOSIS — M150 Primary generalized (osteo)arthritis: Secondary | ICD-10-CM

## 2018-11-16 DIAGNOSIS — Z131 Encounter for screening for diabetes mellitus: Secondary | ICD-10-CM | POA: Diagnosis not present

## 2018-11-16 DIAGNOSIS — I10 Essential (primary) hypertension: Secondary | ICD-10-CM

## 2018-11-16 DIAGNOSIS — F4010 Social phobia, unspecified: Secondary | ICD-10-CM

## 2018-11-16 DIAGNOSIS — Z23 Encounter for immunization: Secondary | ICD-10-CM | POA: Diagnosis not present

## 2018-11-16 DIAGNOSIS — M159 Polyosteoarthritis, unspecified: Secondary | ICD-10-CM

## 2018-11-16 LAB — HEMOGLOBIN A1C: Hgb A1c MFr Bld: 5.4 % (ref 4.6–6.5)

## 2018-11-16 LAB — COMPREHENSIVE METABOLIC PANEL
ALT: 25 U/L (ref 0–53)
AST: 25 U/L (ref 0–37)
Albumin: 4.7 g/dL (ref 3.5–5.2)
Alkaline Phosphatase: 71 U/L (ref 39–117)
BUN: 19 mg/dL (ref 6–23)
CO2: 23 mEq/L (ref 19–32)
CREATININE: 0.95 mg/dL (ref 0.40–1.50)
Calcium: 9.7 mg/dL (ref 8.4–10.5)
Chloride: 104 mEq/L (ref 96–112)
GFR: 78.62 mL/min (ref >60.00)
GLUCOSE: 102 mg/dL — AB (ref 70–99)
Potassium: 4.3 mEq/L (ref 3.5–5.1)
Sodium: 138 mEq/L (ref 135–145)
TOTAL PROTEIN: 7.2 g/dL (ref 6.0–8.3)
Total Bilirubin: 1 mg/dL (ref 0.2–1.2)

## 2018-11-16 LAB — LIPID PANEL
CHOLESTEROL: 216 mg/dL — AB (ref 0–200)
HDL: 33.4 mg/dL — AB (ref >39.00)
NONHDL: 182.81
TRIGLYCERIDES: 283 mg/dL — AB (ref 0.0–149.0)
Total CHOL/HDL Ratio: 6
VLDL: 56.6 mg/dL — ABNORMAL HIGH (ref 0.0–40.0)

## 2018-11-16 LAB — CBC
HCT: 49.9 % (ref 39.0–52.0)
HEMOGLOBIN: 17.4 g/dL — AB (ref 13.0–17.0)
MCHC: 34.8 g/dL (ref 30.0–36.0)
MCV: 95.2 fl (ref 78.0–100.0)
Platelets: 239 10*3/uL (ref 150.0–400.0)
RBC: 5.24 Mil/uL (ref 4.22–5.81)
RDW: 13.2 % (ref 11.5–15.5)
WBC: 4.6 10*3/uL (ref 4.0–10.5)

## 2018-11-16 LAB — LDL CHOLESTEROL, DIRECT: Direct LDL: 120 mg/dL

## 2018-11-16 LAB — TSH: TSH: 2.78 u[IU]/mL (ref 0.35–4.50)

## 2018-11-16 LAB — PSA: PSA: 1.39 ng/mL (ref 0.10–4.00)

## 2018-11-16 MED ORDER — HYDROCHLOROTHIAZIDE 50 MG PO TABS: 50.0000 mg | ORAL_TABLET | Freq: Every day | ORAL | 1 refills | Status: DC

## 2018-11-16 MED ORDER — TRAMADOL HCL 50 MG PO TABS: 50.0000 mg | ORAL_TABLET | Freq: Two times a day (BID) | ORAL | 5 refills | Status: DC | PRN

## 2018-11-16 MED ORDER — LORAZEPAM 1 MG PO TABS: 1.0000 mg | ORAL_TABLET | Freq: Every day | ORAL | 1 refills | Status: DC | PRN

## 2018-11-16 MED ORDER — ZOSTER VAC RECOMB ADJUVANTED 50 MCG/0.5ML IM SUSR: 0.5000 mL | Freq: Once | INTRAMUSCULAR | 1 refills | Status: AC

## 2018-11-16 NOTE — Patient Instructions (Signed)
You completed the pneumonia series today.  I printed the singles vaccine script for you. It is a series of two.   I have refilled your meds for you. In the future the controlled meds have to be a separate appt from your physical.     Health Maintenance After Age 69 After age 12, you are at a higher risk for certain long-term diseases and infections as well as injuries from falls. Falls are a major cause of broken bones and head injuries in people who are older than age 43. Getting regular preventive care can help to keep you healthy and well. Preventive care includes getting regular testing and making lifestyle changes as recommended by your health care provider. Talk with your health care provider about:  Which screenings and tests you should have. A screening is a test that checks for a disease when you have no symptoms.  A diet and exercise plan that is right for you. What should I know about screenings and tests to prevent falls? Screening and testing are the best ways to find a health problem early. Early diagnosis and treatment give you the best chance of managing medical conditions that are common after age 49. Certain conditions and lifestyle choices may make you more likely to have a fall. Your health care provider may recommend:  Regular vision checks. Poor vision and conditions such as cataracts can make you more likely to have a fall. If you wear glasses, make sure to get your prescription updated if your vision changes.  Medicine review. Work with your health care provider to regularly review all of the medicines you are taking, including over-the-counter medicines. Ask your health care provider about any side effects that may make you more likely to have a fall. Tell your health care provider if any medicines that you take make you feel dizzy or sleepy.  Osteoporosis screening. Osteoporosis is a condition that causes the bones to get weaker. This can make the bones weak and cause  them to break more easily.  Blood pressure screening. Blood pressure changes and medicines to control blood pressure can make you feel dizzy.  Strength and balance checks. Your health care provider may recommend certain tests to check your strength and balance while standing, walking, or changing positions.  Foot health exam. Foot pain and numbness, as well as not wearing proper footwear, can make you more likely to have a fall.  Depression screening. You may be more likely to have a fall if you have a fear of falling, feel emotionally low, or feel unable to do activities that you used to do.  Alcohol use screening. Using too much alcohol can affect your balance and may make you more likely to have a fall. What actions can I take to lower my risk of falls? General instructions  Talk with your health care provider about your risks for falling. Tell your health care provider if: ? You fall. Be sure to tell your health care provider about all falls, even ones that seem minor. ? You feel dizzy, sleepy, or off-balance.  Take over-the-counter and prescription medicines only as told by your health care provider. These include any supplements.  Eat a healthy diet and maintain a healthy weight. A healthy diet includes low-fat dairy products, low-fat (lean) meats, and fiber from whole grains, beans, and lots of fruits and vegetables. Home safety  Remove any tripping hazards, such as rugs, cords, and clutter.  Install safety equipment such as grab bars in bathrooms  and safety rails on stairs.  Keep rooms and walkways well-lit. Activity   Follow a regular exercise program to stay fit. This will help you maintain your balance. Ask your health care provider what types of exercise are appropriate for you.  If you need a cane or walker, use it as recommended by your health care provider.  Wear supportive shoes that have nonskid soles. Lifestyle  Do not drink alcohol if your health care provider  tells you not to drink.  If you drink alcohol, limit how much you have: ? 0-1 drink a day for women. ? 0-2 drinks a day for men.  Be aware of how much alcohol is in your drink. In the [place] one drink equals one typical bottle of beer (12 oz), one-half glass of wine (5 oz), or one shot of hard liquor (1 oz).  Do not use any products that contain nicotine or tobacco, such as cigarettes and e-cigarettes. If you need help quitting, ask your health care provider. Summary  Having a healthy lifestyle and getting preventive care can help to protect your health and wellness after age 57.  Screening and testing are the best way to find a health problem early and help you avoid having a fall. Early diagnosis and treatment give you the best chance for managing medical conditions that are more common for people who are older than age 62.  Falls are a major cause of broken bones and head injuries in people who are older than age 33. Take precautions to prevent a fall at home.  Work with your health care provider to learn what changes you can make to improve your health and wellness and to prevent falls. This information is not intended to replace advice given to you by your health care provider. Make sure you discuss any questions you have with your health care provider. Document Released: 08/03/2017 Document Revised: 08/03/2017 Document Reviewed: 08/03/2017 Elsevier Interactive Patient Education  2019 [employer].

## 2018-11-16 NOTE — Progress Notes (Signed)
Patient ID: Henry Knight, male  DOB: [birth year], 68 y.o.   MRN: [id number] Patient Care Team    Relationship Specialty Notifications Start End  Ma Hillock, DO PCP - General Family Medicine  07/11/15   Carol Ada, MD Consulting Physician Gastroenterology  08/11/16   Gaynelle Arabian, MD Consulting Physician Orthopedic Surgery  08/11/16   Melida Quitter, MD Consulting Physician Otolaryngology  08/11/16   Sciences, [facility] (Inactive)    10/13/16    Comment: plastic and reconstructiion [facility] (on [name] in [place])  Lavonna Monarch, MD Consulting Physician Dermatology  06/30/17     Chief Complaint  Patient presents with  . Annual Exam    Fasting. No complaints today.    Subjective:  [name] is a 68 y.o. male present for CPE. All past medical history, surgical history, allergies, family history, immunizations, medications and social history were updated in the electronic medical record today. All recent labs, ED visits and hospitalizations within the last year were reviewed. Hypertension/HLD/obesity:  Pt reports compliance with HCTZ. Blood pressures ranges at home normal.  Patient denies chest pain, shortness of breath or lower extremity edema.  Refused statin.  Started fenofibrate and then refused. He has refused many blood pressure medicines in the past as well.   Arthritis/chronic pain: pt reports  compliance with tramadol.  He does feel his pain is improved since having his knee surgery.    He does ask for refills on his tramadol today.  Patient advised that has to be a separate visit then preventative or acute issues in the future.  Indication for chronic opioid: osteoarthiritis Medication and dose: Tramadol 50-100 Mg QD PRN for pain /Ativan 1 mg daily for tremor # pills per 90d: #120 tramadol/#90 Ativan Last UDS date: Next visit urine will be collected. Pain contract signed (Y/N): Y Date narcotic database last reviewed (include red flags):  11/17/18  Essential tremor/anxiety: Pt has h/o essential tremor and is prescribed ativan 1 mg QD PRN. He has used the medication for years and reports no side effects from medicine.  Does request refills on his Ativan today.  Patient was advised this has to be a separate visit in the future.  Health maintenance:  Colonoscopy: last screen 11/2014, recommend follow up 5 years; resulted 1 polyp. Completed by Dr. Benson Norway. Immunizations:  tdap UTD 2015, influenza admin UTD, PNA series completed today, Shingrix printed for him. Infectious disease screening:  Hep C completed PSA:  Lab Results  Component Value Date   PSA 1.39 11/16/2018   PSA 0.82 08/11/2016   PSA 0.61 08/07/2014  , pt was counseled on prostate cancer screenings.  Assistive device: None Oxygen use: None Patient has a Dental home. Hospitalizations/ED visits: None  Depression screen [facility] 2/9 11/16/2018 03/22/2018 08/11/2016 11/04/2015 11/04/2015  Decreased Interest 0 0 0 0 0  Down, Depressed, Hopeless 0 0 0 0 0  PHQ - 2 Score 0 0 0 0 0  Altered sleeping 1 - - - -  Tired, decreased energy 1 - - - -  Change in appetite 0 - - - -  Feeling bad or failure about yourself  0 - - - -  Trouble concentrating 0 - - - -  Moving slowly or fidgety/restless 0 - - - -  Suicidal thoughts 0 - - - -  PHQ-9 Score 2 - - - -  Difficult doing work/chores Not difficult at all - - - -   GAD 7 : Generalized  Anxiety Score 11/16/2018  Nervous, Anxious, on Edge 1  Control/stop worrying 0  Worry too much - different things 0  Trouble relaxing 1  Restless 1  Easily annoyed or irritable 0  Afraid - awful might happen 0  Total GAD 7 Score 3  Anxiety Difficulty Not difficult at all       Fall Risk  03/22/2018 08/11/2016 11/04/2015 11/04/2015  Falls in the past year? No No No No  Risk for fall due to : - - Other (Comment) Other (Comment)    Immunization History  Administered Date(s) Administered  . Influenza, High Dose Seasonal PF 08/11/2016, 07/29/2017,  07/19/2018  . Influenza,inj,Quad PF,6+ Mos 07/11/2013, 08/07/2014, 07/11/2015  . Influenza-Unspecified 07/27/2017  . Pneumococcal Conjugate-13 08/11/2016, 11/16/2018  . Tdap 06/26/2014    Past Medical History:  Diagnosis Date  . Arthritis   . BCC (basal cell carcinoma of skin)   . Diverticulosis 11/2014   Noted on 11/2014 colonoscopy: ascending, descending, and sigmoid  . Essential tremor   . History of adenomatous polyp of colon 2007; 2016   Recall 5 yrs (Dr. Benson Norway)  . HTN (hypertension)   . Melanoma ([place])    chest  . Neuromuscular disorder ([place])    Allergies  Allergen Reactions  . Penicillins Rash and Other (See Comments)    Has patient had a PCN reaction causing immediate rash, facial/tongue/throat swelling, SOB or lightheadedness with hypotension: Unknown Has patient had a PCN reaction causing severe rash involving mucus membranes or skin necrosis: No Has patient had a PCN reaction that required hospitalization: No Has patient had a PCN reaction occurring within the last 10 years: No If all of the above answers are "NO", then may proceed with Cephalosporin use.    Past Surgical History:  Procedure Laterality Date  . COLONOSCOPY W/ POLYPECTOMY  approx 2007; 11/2014   Dr. Marcy Panning 5 yrs   . COSMETIC SURGERY     laser peels, [facility]astic [facility] reconstructive surgery Clea[facility] Cheek) for acne scars  . KNEE SURGERY Right 2012  . SHOULDER ARTHROSCOPY W/ ROTATOR CUFF REPAIR  2004  . 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 Heart disease Father   . Alcohol abuse Father   . Obesity Brother   . Asperger's syndrome Brother   . Obesity Mother   . Tremor Mother   . 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 He lives  with his wife & daughter.    Allergies as of 11/16/2018      Reactions   Penicillins Rash, Other (See Comments)   Has patient had a PCN reaction causing immediate rash, facial/tongue/throat swelling, SOB or lightheadedness with hypotension: Unknown Has patient had a PCN reaction causing severe rash involving mucus membranes or skin necrosis: No Has patient had a PCN reaction that required hospitalization: No Has patient had a PCN reaction occurring within the last 10 years: No If all of the above answers are "NO", then may proceed with Cephalosporin use.      Medication List       Accurate as of November 16, 2018 11:59 PM. Always use your most recent med list.        Fish Oil 1360 MG Caps Take 1 capsule  by mouth.   hydrochlorothiazide 50 MG tablet Commonly known as:  HYDRODIURIL Take 1 tablet (50 mg total) by mouth daily. Needs an appt for further refills.   LORazepam 1 MG tablet Commonly known as:  ATIVAN Take 1 tablet (1 mg total) by mouth daily as needed for anxiety.   MULTIVITAMIN ADULT PO Take 1 tablet by mouth.   traMADol 50 MG tablet Commonly known as:  ULTRAM Take 1-2 tablets (50-100 mg total) by mouth every 12 (twelve) hours as needed (mild pain).   Turmeric 500 MG Caps Take 1 tablet by mouth.   Zoster Vaccine Adjuvanted injection Commonly known as:  SHINGRIX Inject 0.5 mLs into the muscle once for 1 dose. Rpt x1 in 2-6 months      All past medical history, surgical history, allergies, family history, immunizations andmedications were updated in the EMR today and reviewed under the history and medication portions of their EMR.     Recent Results (from the past 2160 hour(s))  CBC     Status: Abnormal   Collection Time: 11/16/18 11:14 AM  Result Value Ref Range   WBC 4.6 4.0 - 10.5 K/uL   RBC 5.24 4.22 - 5.81 Mil/uL   Platelets 239.0 150.0 - 400.0 K/uL   Hemoglobin 17.4 (H) 13.0 - 17.0 g/dL   HCT 49.9 39.0 - 52.0 %   MCV 95.2 78.0 - 100.0 fl   MCHC  34.8 30.0 - 36.0 g/dL   RDW 13.2 11.5 - 15.5 %  Comp Met (CMET)     Status: Abnormal   Collection Time: 11/16/18 11:14 AM  Result Value Ref Range   Sodium 138 135 - 145 mEq/L   Potassium 4.3 3.5 - 5.1 mEq/L   Chloride 104 96 - 112 mEq/L   CO2 23 19 - 32 mEq/L   Glucose, Bld 102 (H) 70 - 99 mg/dL   BUN 19 6 - 23 mg/dL   Creatinine, Ser 0.95 0.40 - 1.50 mg/dL   Total Bilirubin 1.0 0.2 - 1.2 mg/dL   Alkaline Phosphatase 71 39 - 117 U/L   AST 25 0 - 37 U/L   ALT 25 0 - 53 U/L   Total Protein 7.2 6.0 - 8.3 g/dL   Albumin 4.7 3.5 - 5.2 g/dL   Calcium 9.7 8.4 - 10.5 mg/dL   GFR 78.62 >60.00 mL/min  TSH     Status: None   Collection Time: 11/16/18 11:14 AM  Result Value Ref Range   TSH 2.78 0.35 - 4.50 uIU/mL  HgB A1c     Status: None   Collection Time: 11/16/18 11:14 AM  Result Value Ref Range   Hgb A1c MFr Bld 5.4 4.6 - 6.5 %    Comment: Glycemic Control Guidelines for People with Diabetes:Non Diabetic:  <6%Goal of Therapy: <7%Additional Action Suggested:  >8%   Lipid panel     Status: Abnormal   Collection Time: 11/16/18 11:14 AM  Result Value Ref Range   Cholesterol 216 (H) 0 - 200 mg/dL    Comment: ATP III Classification       Desirable:  < 200 mg/dL               Borderline High:  200 - 239 mg/dL          High:  > = 240 mg/dL   Triglycerides 283.0 (H) 0.0 - 149.0 mg/dL    Comment: Normal:  <150 mg/dLBorderline High:  150 - 199 mg/dL   HDL 33.40 (L) >39.00 mg/dL  VLDL 56.6 (H) 0.0 - 40.0 mg/dL   Total CHOL/HDL Ratio 6     Comment:                Men          Women1/2 Average Risk     3.4          3.3Average Risk          5.0          4.42X Average Risk          9.6          7.13X Average Risk          15.0          11.0                       NonHDL 182.81     Comment: NOTE:  Non-HDL goal should be 30 mg/dL higher than patient's LDL goal (i.e. LDL goal of < 70 mg/dL, would have non-HDL goal of < 100 mg/dL)  PSA     Status: None   Collection Time: 11/16/18 11:14 AM  Result  Value Ref Range   PSA 1.39 0.10 - 4.00 ng/mL    Comment: Test performed using Access Hybritech PSA Assay, a parmagnetic partical, chemiluminecent immunoassay.  LDL cholesterol, direct     Status: None   Collection Time: 11/16/18 11:14 AM  Result Value Ref Range   Direct LDL 120.0 mg/dL    Comment: Optimal:  <100 mg/dLNear or Above Optimal:  100-129 mg/dLBorderline High:  130-159 mg/dLHigh:  160-189 mg/dLVery High:  >190 mg/dL    No results found.   ROS: 14 pt review of systems performed and negative (unless mentioned in an HPI)  Objective: BP 111/81 (BP Location: Right Arm, Patient Position: Sitting, Cuff Size: Normal)   Pulse 91   Temp 97.6 F (36.4 C) (Oral)   Resp 16   Ht 6' 0.5" (1.842 m)   Wt 230 lb 4 oz (104.4 kg)   SpO2 97%   BMI 30.80 kg/m  Gen: Afebrile. No acute distress. Nontoxic in appearance, well-developed, well-nourished,  Obese male.  HENT: AT. [place]. Bilateral TM visualized and normal in appearance, normal external auditory canal. MMM, no oral lesions, adequate dentition. Bilateral nares within normal limits. Throat without erythema, ulcerations or exudates. no Cough on exam, no hoarseness on exam. Eyes:Pupils Equal Round Reactive to light, Extraocular movements intact,  Conjunctiva without redness, discharge or icterus. Neck/lymp/endocrine: Supple,no lymphadenopathy, no thyromegaly CV: RRR no murmur, no edema, +2/4 P posterior tibialis pulses. no carotid bruits. No JVD. Chest: CTAB, no wheeze, rhonchi or crackles. normal Respiratory effort. good Air movement. Abd: Soft. flat. NTND. BS present. no Masses palpated. No hepatosplenomegaly. No rebound tenderness or guarding. Skin: no rashes, purpura or petechiae. Warm and well-perfused. Skin intact. Neuro/Msk:  Normal gait. Hand tremor present. PERLA. EOMi. Alert. Oriented x3.  Cranial nerves II through XII intact. Muscle strength 5/5 upper/lower extremity. DTRs equal bilaterally. Psych: Normal affect, dress and demeanor.  Normal speech. Normal thought content and judgment.  No exam data present  Assessment/plan: TOY EISEMANN is a 68 y.o. male present for CPE Essential hypertension/Hypertriglyceridemia/Obesity (BMI 30-39.9) - consider lovaza if elevated- he is taking OTC fish oil- he was interested in trying if affordable.  - diet and routine exercise.  - Comp Met (CMET) - TSH - Lipid panel - hydrochlorothiazide (HYDRODIURIL) 50 MG tablet; Take 1 tablet (50 mg total) by mouth  daily. Needs an appt for further refills.  Dispense: 90 tablet; Refill: 1 - f/u 6 months. Prostate cancer screening - PSA Diabetes mellitus screening - HgB A1c Chronic prescription benzodiazepine use/Controlled substance agreement signed/Primary osteoarthritis involving multiple joints/Social anxiety disorder/ Essential tremor Medication and dose: Tramadol 50-100 Mg QD PRN for pain /Ativan 1 mg daily for tremor # pills per 90d: #120 tramadol/#90 Ativan--> both refilled today. Last UDS date: Next visit urine will be collected. Pain contract signed (Y/N): Y Date narcotic database last reviewed (include red flags): 11/17/18 - LORazepam (ATIVAN) 1 MG tablet; Take 1 tablet (1 mg total) by mouth daily as needed for anxiety.  Dispense: 90 tablet; Refill: 1 Need for pneumococcal vaccination - Pneumococcal conjugate vaccine 13-valent IM Encounter for general adult medical examination with abnormal findings Patient was encouraged to exercise greater than 150 minutes a week. Patient was encouraged to choose a diet filled with fresh fruits and vegetables, and lean meats. AVS provided to patient today for education/recommendation on gender specific health and safety maintenance. Colonoscopy: last screen 11/2014, recommend follow up 5 years; resulted 1 polyp. Completed by Dr. Benson Norway. Immunizations:  tdap UTD 2015, influenza admin UTD, PNA series completed today, Shingrix printed for him. Infectious disease screening:  Hep C completed PSA:  Collected today.  Return in about 1 year (around 11/17/2019) for CPE. 6 months chronic conditions.   Note is dictated utilizing voice recognition software. Although note has been proof read prior to signing, occasional typographical errors still can be missed. If any questions arise, please do not hesitate to call for verification.  Electronically signed by: Howard Pouch, DO [place]

## 2018-11-17 ENCOUNTER — Encounter: Payer: Self-pay | Admitting: Family Medicine

## 2018-11-17 ENCOUNTER — Telehealth: Payer: Self-pay | Admitting: Family Medicine

## 2018-11-17 DIAGNOSIS — E781 Pure hyperglyceridemia: Secondary | ICD-10-CM

## 2018-11-17 DIAGNOSIS — D582 Other hemoglobinopathies: Secondary | ICD-10-CM

## 2018-11-17 MED ORDER — OMEGA-3-ACID ETHYL ESTERS 1 G PO CAPS
2.0000 g | ORAL_CAPSULE | Freq: Two times a day (BID) | ORAL | 11 refills | Status: DC
Start: 2018-11-17 — End: 2019-12-18

## 2018-11-17 NOTE — Telephone Encounter (Addendum)
Please inform patient the following information: His triglycerides are elevated 283 (< 150 best). I did call in the [place] (prescribed omega 3) we spoke of during the appt.  His hgb was higher than before on this collection. Could have been from fasting state. I would want to hive that portion of the blood test repeat in 2 weeks to ensure it has returned to normal.-- he should drink plenty of fluids prior to test.  Lab apt only- ordered test The rest of his labs are all normal.

## 2018-11-17 NOTE — Telephone Encounter (Signed)
Tried to call pt and VM picked up. Message was left to return call in regards to lab work.   Okay for PEC to discuss results/PCP recommendations.

## 2018-11-20 ENCOUNTER — Telehealth: Payer: Self-pay | Admitting: Family Medicine

## 2018-11-20 NOTE — Telephone Encounter (Signed)
Patient returned call- results and recommendations given. Repeat lab scheduled

## 2018-11-20 NOTE — Telephone Encounter (Signed)
Pt returning call for lab results.    Copied from [place] [phone]. Topic: Quick Communication - Lab Results (Clinic Use ONLY) >> Nov 17, 2018  4:56 PM Caroll Rancher, LPN wrote: Bonne Dolores to call pt and VM picked up. Message was left to return call in regards to lab work.   Okay for PEC to discuss results/PCP recommendations.

## 2018-11-20 NOTE — Telephone Encounter (Signed)
Left message to call back regarding lab results.

## 2018-11-29 ENCOUNTER — Other Ambulatory Visit (INDEPENDENT_AMBULATORY_CARE_PROVIDER_SITE_OTHER): Payer: Medicare Other

## 2018-11-29 DIAGNOSIS — D582 Other hemoglobinopathies: Secondary | ICD-10-CM | POA: Diagnosis not present

## 2018-11-29 LAB — HEMOGLOBIN AND HEMATOCRIT, BLOOD
HCT: 44.8 % (ref 39.0–52.0)
Hemoglobin: 15.6 g/dL (ref 13.0–17.0)

## 2018-12-01 ENCOUNTER — Other Ambulatory Visit: Payer: Medicare Other

## 2019-02-20 DIAGNOSIS — M1710 Unilateral primary osteoarthritis, unspecified knee: Secondary | ICD-10-CM | POA: Diagnosis present

## 2019-02-20 DIAGNOSIS — M179 Osteoarthritis of knee, unspecified: Secondary | ICD-10-CM | POA: Diagnosis present

## 2019-02-20 HISTORY — DX: Osteoarthritis of knee, unspecified: M17.9

## 2019-07-11 DIAGNOSIS — C44519 Basal cell carcinoma of skin of other part of trunk: Secondary | ICD-10-CM | POA: Diagnosis not present

## 2019-07-11 DIAGNOSIS — L570 Actinic keratosis: Secondary | ICD-10-CM | POA: Diagnosis not present

## 2019-07-11 DIAGNOSIS — L820 Inflamed seborrheic keratosis: Secondary | ICD-10-CM | POA: Diagnosis not present

## 2019-07-11 DIAGNOSIS — C4491 Basal cell carcinoma of skin, unspecified: Secondary | ICD-10-CM

## 2019-07-11 DIAGNOSIS — D229 Melanocytic nevi, unspecified: Secondary | ICD-10-CM | POA: Diagnosis not present

## 2019-07-11 DIAGNOSIS — D485 Neoplasm of uncertain behavior of skin: Secondary | ICD-10-CM | POA: Diagnosis not present

## 2019-07-11 HISTORY — DX: Basal cell carcinoma of skin, unspecified: C44.91

## 2019-07-18 ENCOUNTER — Other Ambulatory Visit: Payer: Self-pay

## 2019-07-18 ENCOUNTER — Ambulatory Visit (INDEPENDENT_AMBULATORY_CARE_PROVIDER_SITE_OTHER): Payer: Medicare Other | Admitting: Family Medicine

## 2019-07-18 ENCOUNTER — Encounter: Payer: Self-pay | Admitting: Family Medicine

## 2019-07-18 VITALS — BP 116/86 | HR 61 | Temp 98.0°F | Resp 16 | Ht 73.0 in | Wt 239.0 lb

## 2019-07-18 DIAGNOSIS — E669 Obesity, unspecified: Secondary | ICD-10-CM

## 2019-07-18 DIAGNOSIS — Z23 Encounter for immunization: Secondary | ICD-10-CM | POA: Diagnosis not present

## 2019-07-18 DIAGNOSIS — I10 Essential (primary) hypertension: Secondary | ICD-10-CM

## 2019-07-18 DIAGNOSIS — E781 Pure hyperglyceridemia: Secondary | ICD-10-CM

## 2019-07-18 DIAGNOSIS — G250 Essential tremor: Secondary | ICD-10-CM | POA: Diagnosis not present

## 2019-07-18 DIAGNOSIS — M8949 Other hypertrophic osteoarthropathy, multiple sites: Secondary | ICD-10-CM

## 2019-07-18 DIAGNOSIS — F4010 Social phobia, unspecified: Secondary | ICD-10-CM

## 2019-07-18 DIAGNOSIS — Z79899 Other long term (current) drug therapy: Secondary | ICD-10-CM

## 2019-07-18 DIAGNOSIS — M159 Polyosteoarthritis, unspecified: Secondary | ICD-10-CM

## 2019-07-18 MED ORDER — LORAZEPAM 1 MG PO TABS: 1.0000 mg | ORAL_TABLET | Freq: Every day | ORAL | 1 refills | Status: DC | PRN

## 2019-07-18 MED ORDER — TRAMADOL HCL 50 MG PO TABS: 50.0000 mg | ORAL_TABLET | Freq: Two times a day (BID) | ORAL | 1 refills | Status: DC | PRN

## 2019-07-18 NOTE — Progress Notes (Signed)
Patient ID: Henry Knight, male  DOB: 1950/11/13, 68 y.o.   MRN: [id number] Patient Care Team    Relationship Specialty Notifications Start End  Ma Hillock, DO PCP - General Family Medicine  07/11/15   Carol Ada, MD Consulting Physician Gastroenterology  08/11/16   Gaynelle Arabian, MD Consulting Physician Orthopedic Surgery  08/11/16   Melida Quitter, MD Consulting Physician Otolaryngology  08/11/16   Sciences, [facility] (Inactive)    10/13/16    Comment: plastic and reconstructiion [facility] (on [name] in [place])  Lavonna Monarch, MD Consulting Physician Dermatology  06/30/17     Chief Complaint  Patient presents with  . Hypertension    Subjective:  RIAN Knight is a 68 y.o. male present for [facility] followup HLD/obesity:  Pt reports he has not been taking the  HCTZ. Blood pressures ranges at home normal. Refused statin.  Started fenofibrate and then refused. He has refused many blood pressure medicines in the past as well. Taking fish oil.  He reports blood pressures have been normal at home without the HCTZ.  Arthritis/chronic pain: pt reports  compliance with tramadol 1-2 tab as needed for pain- taking about 3x/wk. He does feel his pain is improved since having his knee surgery.      Indication for chronic opioid: osteoarthiritis Medication and dose: Tramadol 50-100 Mg QD PRN for pain /Ativan 1 mg daily for tremor/anxiety # pills per 90d: #90 tramadol/#90 Ativan Last UDS date: Collected today. Pain contract signed (Y/N): Y Date narcotic database last reviewed (include red flags): 07/18/19  Essential tremor/anxiety: Pt has h/o essential tremor and is prescribed ativan 1 mg QD PRN. He has used the medication for years and reports no side effects from medicine.  Does request refills on his Ativan today.    Depression screen [facility] 2/9 07/18/2019 11/16/2018 03/22/2018 08/11/2016 11/04/2015  Decreased Interest 1 0 0 0 0  Down, Depressed, Hopeless 0 0 0 0 0   PHQ - 2 Score 1 0 0 0 0  Altered sleeping - 1 - - -  Tired, decreased energy - 1 - - -  Change in appetite - 0 - - -  Feeling bad or failure about yourself  - 0 - - -  Trouble concentrating - 0 - - -  Moving slowly or fidgety/restless - 0 - - -  Suicidal thoughts - 0 - - -  PHQ-9 Score - 2 - - -  Difficult doing work/chores - Not difficult at all - - -   GAD 7 : Generalized Anxiety Score 07/18/2019 11/16/2018  Nervous, Anxious, on Edge 1 1  Control/stop worrying 0 0  Worry too much - different things 0 0  Trouble relaxing 1 1  Restless 1 1  Easily annoyed or irritable 1 0  Afraid - awful might happen 0 0  Total GAD 7 Score 4 3  Anxiety Difficulty Not difficult at all Not difficult at all       Fall Risk  03/22/2018 08/11/2016 11/04/2015 11/04/2015  Falls in the past year? No No No No  Risk for fall due to : - - Other (Comment) Other (Comment)    Immunization History  Administered Date(s) Administered  . Fluad Quad(high Dose 65+) 07/18/2019  . Influenza, High Dose Seasonal PF 08/11/2016, 07/29/2017, 07/19/2018  . Influenza,inj,Quad PF,6+ Mos 07/11/2013, 08/07/2014, 07/11/2015  . Influenza-Unspecified 07/27/2017  . Pneumococcal Conjugate-13 08/11/2016, 11/16/2018  . Tdap 06/26/2014    Past Medical History:  Diagnosis  Date  . Arthritis   . BCC (basal cell carcinoma of skin)   . Diverticulosis 11/2014   Noted on 11/2014 colonoscopy: ascending, descending, and sigmoid  . Essential tremor   . History of adenomatous polyp of colon 2007; 2016   Recall 5 yrs (Dr. Benson [place])  . HTN (hypertension)   . Melanoma ([place])    chest  . Neuromuscular disorder ([place])    Allergies  Allergen Reactions  . Penicillins Rash and Other (See Comments)    Has patient had a PCN reaction causing immediate rash, facial/tongue/throat swelling, SOB or lightheadedness with hypotension: Unknown Has patient had a PCN reaction causing severe rash involving mucus membranes or skin necrosis: No Has patient had  a PCN reaction that required hospitalization: No Has patient had a PCN reaction occurring within the last 10 years: No If all of the above answers are "NO", then may proceed with Cephalosporin use.    Past Surgical History:  Procedure Laterality Date  . COLONOSCOPY W/ POLYPECTOMY  approx 2007; 11/2014   Dr. Marcy Panning 5 yrs   . COSMETIC SURGERY     laser peels, [facility]astic [facility] reconstructive surgery Clearence Cheek) for acne scars  . KNEE SURGERY Right 2012  . SHOULDER ARTHROSCOPY W/ ROTATOR CUFF REPAIR  2004  . 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 Heart disease Father   . Alcohol abuse Father   . Obesity Brother   . Asperger's syndrome Brother   . Obesity Mother   . Tremor Mother   . 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 He lives with his wife & daughter.    Allergies as of 07/18/2019      Reactions   Penicillins Rash, Other (See Comments)   Has patient had a PCN reaction causing immediate rash, facial/tongue/throat swelling, SOB or lightheadedness with hypotension: Unknown Has patient had a PCN reaction causing severe rash involving mucus membranes or skin necrosis: No Has patient had a PCN reaction that required hospitalization: No Has patient had a PCN reaction occurring within the last 10 years: No If all of the above answers are "NO", then may proceed with Cephalosporin use.      Medication List       Accurate as of July 18, 2019 11:43 AM. If you have any questions, ask your nurse or doctor.        STOP taking these medications   hydrochlorothiazide 50 MG tablet Commonly known as: HYDRODIURIL Stopped by: Howard Pouch, DO     TAKE these medications   LORazepam 1 MG tablet Commonly known  as: ATIVAN Take 1 tablet (1 mg total) by mouth daily as needed for anxiety.   MULTIVITAMIN ADULT PO Take 1 tablet by mouth.   omega-3 acid ethyl esters 1 g capsule Commonly known as: Lovaza Take 2 capsules (2 g total) by mouth 2 (two) times daily.   traMADol 50 MG tablet Commonly known as: ULTRAM Take 1-2 tablets (50-100 mg total) by mouth every 12 (twelve) hours as needed (mild pain).   Turmeric 500 MG Caps Take 1 tablet by mouth.      All past medical history, surgical history, allergies, family history, immunizations andmedications were updated in the EMR today and  reviewed under the history and medication portions of their EMR.     No results found for this or any previous visit (from the past 2160 hour(s)).  No results found.   ROS: 14 pt review of systems performed and negative (unless mentioned in an HPI)  Objective: BP 116/86 (BP Location: Left Arm, Patient Position: Sitting, Cuff Size: Normal)   Pulse 61   Temp 98 F (36.7 C) (Temporal)   Resp 16   Ht 6\' 1"  (1.854 m)   Wt 239 lb (108.4 kg)   SpO2 96%   BMI 31.53 kg/m  Gen: Afebrile. No acute distress.  HENT: AT. [place].  MMM.  Eyes:Pupils Equal Round Reactive to light, Extraocular movements intact,  Conjunctiva without redness, discharge or icterus. Neck/lymp/endocrine: Supple,no lymphadenopathy, no thyromegaly CV: RRR no murmur, no edema, +2/4 P posterior tibialis pulses Chest: CTAB, no wheeze or crackles Skin: no rashes, purpura or petechiae.  Neuro: Normal gait. PERLA. EOMi. Alert. Oriented x3. Essential tremor BL hands.  Psych: Normal affect, dress and demeanor. Normal speech. Normal thought content and judgment.   No exam data present  Assessment/plan: ALEJANDRA LEMMO is a 68 y.o. male present for CPE Hypertriglyceridemia/Obesity (BMI 30-39.9) - BP controlled w/ diet and exercise currently. He has not been taking HCTZ - continue lovaza - diet and routine exercise.  - f/u 6 months.  Chronic  prescription benzodiazepine use/Controlled substance agreement signed/Primary osteoarthritis involving multiple joints/Social anxiety disorder/ Essential tremor Medication and dose: Tramadol 50-100 Mg QD PRN for pain /Ativan 1 mg daily for tremor # pills per 90d: tramadol/#90 Ativan--> both refilled today. Last UDS date:collected today Pain contract signed (Y/N): Y, updated 07/2019 Date narcotic database last reviewed (include red flags): 07/18/19  -Patient would like to see if there is any new treatments for his tremor.  He states he saw Dr. Carles Collet speak at a symposium and would like to have a referral to her to discuss. - LORazepam (ATIVAN) 1 MG tablet; Take 1 tablet (1 mg total) by mouth daily as needed for anxiety.  Dispense: 90 tablet; Refill: 1  Influenza vaccine administered today.  6 months chronic conditions  Orders Placed This Encounter  Procedures  . Flu Vaccine QUAD High Dose(Fluad)  . Pain Mgmt, Profile 8 w/Conf, U  . Ambulatory referral to Neurology    Note is dictated utilizing voice recognition software. Although note has been proof read prior to signing, occasional typographical errors still can be missed. If any questions arise, please do not hesitate to call for verification.  Electronically signed by: Howard Pouch, DO [place]

## 2019-07-18 NOTE — Patient Instructions (Addendum)
Great to see you today.  Your BP looks great! Have a great time at the beach.   Follow up in 6 months for chronic conditions/physical   I will refer you to neurology to discuss options for your tremor.

## 2019-07-20 LAB — PAIN MGMT, PROFILE 8 W/CONF, U
6 Acetylmorphine: NEGATIVE ng/mL
Alcohol Metabolites: NEGATIVE ng/mL (ref <500)
Alphahydroxyalprazolam: NEGATIVE ng/mL
Alphahydroxymidazolam: NEGATIVE ng/mL
Alphahydroxytriazolam: NEGATIVE ng/mL
Aminoclonazepam: NEGATIVE ng/mL
Amphetamines: NEGATIVE ng/mL
Benzodiazepines: POSITIVE ng/mL
Buprenorphine, Urine: NEGATIVE ng/mL
Cocaine Metabolite: NEGATIVE ng/mL
Creatinine: 99.2 mg/dL
Hydroxyethylflurazepam: NEGATIVE ng/mL
Lorazepam: 61 ng/mL
MDMA: NEGATIVE ng/mL
Marijuana Metabolite: NEGATIVE ng/mL
Nordiazepam: NEGATIVE ng/mL
Opiates: NEGATIVE ng/mL
Oxazepam: NEGATIVE ng/mL
Oxidant: NEGATIVE ug/mL
Oxycodone: NEGATIVE ng/mL
Temazepam: NEGATIVE ng/mL
pH: 6.4 (ref 4.5–9.0)

## 2019-08-16 DIAGNOSIS — C44519 Basal cell carcinoma of skin of other part of trunk: Secondary | ICD-10-CM | POA: Diagnosis not present

## 2019-09-13 ENCOUNTER — Ambulatory Visit: Payer: Medicare Other | Admitting: Neurology

## 2019-10-25 ENCOUNTER — Ambulatory Visit: Payer: Medicare Other | Attending: Internal Medicine

## 2019-10-25 DIAGNOSIS — Z23 Encounter for immunization: Secondary | ICD-10-CM | POA: Insufficient documentation

## 2019-10-25 NOTE — Progress Notes (Signed)
   Covid-19 Vaccination Clinic  Name:  Henry Knight    MRN: [id number] DOB: 01-09-1951  10/25/2019  [name] was observed post Covid-19 immunization for 15 minutes without incidence. He was provided with Vaccine Information Sheet and instruction to access the V-Safe system.   [name] was instructed to call 911 with any severe reactions post vaccine: Marland Kitchen Difficulty breathing  . Swelling of your face and throat  . A fast heartbeat  . A bad rash all over your body  . Dizziness and weakness    Immunizations Administered    Name Date Dose VIS Date Route   Pfizer COVID-19 Vaccine 10/25/2019 12:17 PM 0.3 mL 09/14/2019 Intramuscular   Manufacturer: [place]   Lot: [id number]   [place]: [id number]

## 2019-11-15 ENCOUNTER — Ambulatory Visit: Payer: Medicare Other | Attending: Internal Medicine

## 2019-11-15 DIAGNOSIS — Z23 Encounter for immunization: Secondary | ICD-10-CM | POA: Insufficient documentation

## 2019-11-15 NOTE — Progress Notes (Signed)
   Covid-19 Vaccination Clinic  Name:  Henry Knight    MRN: [id number] DOB: [birth year]  11/15/2019  Henry Knight was observed post Covid-19 immunization for 15 minutes without incidence. He was provided with Vaccine Information Sheet and instruction to access the V-Safe system.   Henry Knight was instructed to call 911 with any severe reactions post vaccine: Marland Kitchen Difficulty breathing  . Swelling of your face and throat  . A fast heartbeat  . A bad rash all over your body  . Dizziness and weakness    Immunizations Administered    Name Date Dose VIS Date Route   Pfizer COVID-19 Vaccine 11/15/2019 10:52 AM 0.3 mL 09/14/2019 Intramuscular   Manufacturer: [place]   Lot: [id number]   [place]: [id number]

## 2019-12-18 ENCOUNTER — Other Ambulatory Visit: Payer: Self-pay | Admitting: Family Medicine

## 2019-12-18 DIAGNOSIS — E781 Pure hyperglyceridemia: Secondary | ICD-10-CM

## 2020-01-10 ENCOUNTER — Other Ambulatory Visit: Payer: Self-pay | Admitting: Family Medicine

## 2020-01-10 DIAGNOSIS — E781 Pure hyperglyceridemia: Secondary | ICD-10-CM

## 2020-01-11 ENCOUNTER — Encounter: Payer: Medicare Other | Admitting: Family Medicine

## 2020-01-11 NOTE — Telephone Encounter (Signed)
Pt has enough until appt on 01/18/20.

## 2020-01-18 ENCOUNTER — Other Ambulatory Visit: Payer: Self-pay

## 2020-01-18 ENCOUNTER — Encounter: Payer: Self-pay | Admitting: Family Medicine

## 2020-01-18 ENCOUNTER — Ambulatory Visit (INDEPENDENT_AMBULATORY_CARE_PROVIDER_SITE_OTHER): Payer: Medicare Other | Admitting: Family Medicine

## 2020-01-18 VITALS — BP 140/92 | HR 60 | Temp 97.6°F | Resp 17 | Ht 73.0 in | Wt 238.2 lb

## 2020-01-18 DIAGNOSIS — Z131 Encounter for screening for diabetes mellitus: Secondary | ICD-10-CM

## 2020-01-18 DIAGNOSIS — H6121 Impacted cerumen, right ear: Secondary | ICD-10-CM

## 2020-01-18 DIAGNOSIS — E669 Obesity, unspecified: Secondary | ICD-10-CM | POA: Diagnosis not present

## 2020-01-18 DIAGNOSIS — G250 Essential tremor: Secondary | ICD-10-CM | POA: Diagnosis not present

## 2020-01-18 DIAGNOSIS — Z125 Encounter for screening for malignant neoplasm of prostate: Secondary | ICD-10-CM

## 2020-01-18 DIAGNOSIS — M8949 Other hypertrophic osteoarthropathy, multiple sites: Secondary | ICD-10-CM

## 2020-01-18 DIAGNOSIS — Z1211 Encounter for screening for malignant neoplasm of colon: Secondary | ICD-10-CM

## 2020-01-18 DIAGNOSIS — M179 Osteoarthritis of knee, unspecified: Secondary | ICD-10-CM

## 2020-01-18 DIAGNOSIS — I10 Essential (primary) hypertension: Secondary | ICD-10-CM | POA: Diagnosis not present

## 2020-01-18 DIAGNOSIS — E781 Pure hyperglyceridemia: Secondary | ICD-10-CM | POA: Diagnosis not present

## 2020-01-18 DIAGNOSIS — Z0000 Encounter for general adult medical examination without abnormal findings: Secondary | ICD-10-CM | POA: Diagnosis not present

## 2020-01-18 DIAGNOSIS — M159 Polyosteoarthritis, unspecified: Secondary | ICD-10-CM

## 2020-01-18 DIAGNOSIS — Z79899 Other long term (current) drug therapy: Secondary | ICD-10-CM

## 2020-01-18 DIAGNOSIS — M1710 Unilateral primary osteoarthritis, unspecified knee: Secondary | ICD-10-CM

## 2020-01-18 DIAGNOSIS — F4010 Social phobia, unspecified: Secondary | ICD-10-CM

## 2020-01-18 LAB — LIPID PANEL
Cholesterol: 192 mg/dL (ref 0–200)
HDL: 33 mg/dL — ABNORMAL LOW (ref >39.00)
NonHDL: 158.9
Total CHOL/HDL Ratio: 6
Triglycerides: 210 mg/dL — ABNORMAL HIGH (ref 0.0–149.0)
VLDL: 42 mg/dL — ABNORMAL HIGH (ref 0.0–40.0)

## 2020-01-18 LAB — COMPREHENSIVE METABOLIC PANEL
ALT: 20 U/L (ref 0–53)
AST: 22 U/L (ref 0–37)
Albumin: 4.5 g/dL (ref 3.5–5.2)
Alkaline Phosphatase: 64 U/L (ref 39–117)
BUN: 21 mg/dL (ref 6–23)
CO2: 25 mEq/L (ref 19–32)
Calcium: 9 mg/dL (ref 8.4–10.5)
Chloride: 105 mEq/L (ref 96–112)
Creatinine, Ser: 0.9 mg/dL (ref 0.40–1.50)
GFR: 83.4 mL/min (ref >60.00)
Glucose, Bld: 103 mg/dL — ABNORMAL HIGH (ref 70–99)
Potassium: 4.4 mEq/L (ref 3.5–5.1)
Sodium: 138 mEq/L (ref 135–145)
Total Bilirubin: 0.8 mg/dL (ref 0.2–1.2)
Total Protein: 6.7 g/dL (ref 6.0–8.3)

## 2020-01-18 LAB — CBC
HCT: 44 % (ref 39.0–52.0)
Hemoglobin: 15.1 g/dL (ref 13.0–17.0)
MCHC: 34.3 g/dL (ref 30.0–36.0)
MCV: 96.8 fl (ref 78.0–100.0)
Platelets: 192 10*3/uL (ref 150.0–400.0)
RBC: 4.54 Mil/uL (ref 4.22–5.81)
RDW: 12.8 % (ref 11.5–15.5)
WBC: 3.7 10*3/uL — ABNORMAL LOW (ref 4.0–10.5)

## 2020-01-18 LAB — TSH: TSH: 3.28 u[IU]/mL (ref 0.35–4.50)

## 2020-01-18 LAB — PSA: PSA: 0.97 ng/mL (ref 0.10–4.00)

## 2020-01-18 LAB — HEMOGLOBIN A1C: Hgb A1c MFr Bld: 5.3 % (ref 4.6–6.5)

## 2020-01-18 LAB — LDL CHOLESTEROL, DIRECT: Direct LDL: 103 mg/dL

## 2020-01-18 MED ORDER — TRAMADOL HCL 50 MG PO TABS: 50.0000 mg | ORAL_TABLET | Freq: Two times a day (BID) | ORAL | 1 refills | Status: DC | PRN

## 2020-01-18 MED ORDER — ZOSTER VAC RECOMB ADJUVANTED 50 MCG/0.5ML IM SUSR: 0.5000 mL | Freq: Once | INTRAMUSCULAR | 1 refills | Status: AC

## 2020-01-18 MED ORDER — DEBROX 6.5 % OT SOLN: 5.0000 [drp] | Freq: Every day | OTIC | 0 refills | Status: AC

## 2020-01-18 MED ORDER — OMEGA-3-ACID ETHYL ESTERS 1 G PO CAPS: 2.0000 g | ORAL_CAPSULE | Freq: Two times a day (BID) | ORAL | 11 refills | Status: DC

## 2020-01-18 MED ORDER — OMEGA-3-ACID ETHYL ESTERS 1 G PO CAPS: 2.0000 g | ORAL_CAPSULE | Freq: Two times a day (BID) | ORAL | 0 refills | Status: DC

## 2020-01-18 MED ORDER — LORAZEPAM 1 MG PO TABS: 1.0000 mg | ORAL_TABLET | Freq: Every day | ORAL | 1 refills | Status: DC | PRN

## 2020-01-18 NOTE — Progress Notes (Signed)
This visit occurred during the SARS-CoV-2 public health emergency.  Safety protocols were in place, including screening questions prior to the visit, additional usage of staff PPE, and extensive cleaning of exam room while observing appropriate contact time as indicated for disinfecting solutions.    Patient ID: Henry Knight, male  DOB: [birth year], 69 y.o.   MRN: [id number] Patient Care Team    Relationship Specialty Notifications Start End  Henry Hillock, DO PCP - General Family Medicine  07/11/15   Henry Ada, MD Consulting Physician Gastroenterology  08/11/16   Henry Arabian, MD Consulting Physician Orthopedic Surgery  08/11/16   Henry Quitter, MD Consulting Physician Otolaryngology  08/11/16   Sciences, [facility] (Inactive)    10/13/16    Comment: plastic and reconstructiion [facility] (on Henry Knight in [place])  Henry Monarch, MD Consulting Physician Dermatology  06/30/17     Chief Complaint  Patient presents with  . Annual Exam    Fasting. colonoscopy 2016.    Subjective:  Henry Knight is a 69 y.o. male present for CPE. All past medical history, surgical history, allergies, family history, immunizations, medications and social history were updated in the electronic medical record today. All recent labs, ED visits and hospitalizations within the last year were reviewed.  HLD/obesity:  Pt reports he has been noncompliant with HCTZ. Blood pressures ranges at home normal. Refused statin.  Started fenofibrate and then refused. He has refused many blood pressure medicines in the past as well. He is taking his fish oil.  Arthritis/chronic pain: pt reportsplanswith tramadol 1-2 tab as neededfor pain- taking about 3x/wk.He does feel his pain is improved since having his knee surgery but still is in need of pain medication from time to time.   Indication for chronic opioid: osteoarthiritis Medication and dose: Tramadol 50-100 Mg QD PRN for pain /Ativan 1 mg  daily for tremor/anxiety # pills per90d:#90 tramadol/#90 Ativan Last UDS date: Collected today. Pain contract signed (Y/N): Y Date narcotic database last reviewed (include red flags):  01/18/2020  Essential tremor/anxiety: Pt has h/o essential tremor and is prescribed ativan 1 mg QD PRN. He has used the medication for years and reports no side effects from medicine.    Patient reports the Ativan works well enough.  He had looked into other types of therapy with brain stimulators etc. and decided he would not like to go down the road and would stay on Ativan.  Health maintenance:  Colonoscopy: last screen2/2016, recommend follow up5 years; resulted1 polyp. Completed byDr. Hung.>> referred today  Immunizations: tdap UTD 2015, influenzaUTD 2020, Prevnar completed-Pneumovax is needed, Shingrix printed for him last yr - reprinted today, covid pfizer series completed. Infectious disease screening: Hep C completed PSA:  Lab Results  Component Value Date   PSA 0.97 01/18/2020   PSA 1.39 11/16/2018   PSA 0.82 08/11/2016  , pt was counseled on prostate cancer screenings.  Assistive device: none Oxygen [id number] Patient has a Dental home. Hospitalizations/ED visits: reviewed  Depression screen [facility] 2/9 01/18/2020 07/18/2019 11/16/2018 03/22/2018 08/11/2016  Decreased Interest 0 1 0 0 0  Down, Depressed, Hopeless 0 0 0 0 0  PHQ - 2 Score 0 1 0 0 0  Altered sleeping - - 1 - -  Tired, decreased energy - - 1 - -  Change in appetite - - 0 - -  Feeling bad or failure about yourself  - - 0 - -  Trouble concentrating - - 0 - -  Moving slowly  or fidgety/restless - - 0 - -  Suicidal thoughts - - 0 - -  PHQ-9 Score - - 2 - -  Difficult doing work/chores - - Not difficult at all - -   GAD 7 : Generalized Anxiety Score 01/18/2020 07/18/2019 11/16/2018  Nervous, Anxious, on Edge 0 1 1  Control/stop worrying 0 0 0  Worry too much - different things 0 0 0  Trouble relaxing 3 1 1   Restless 3 1 1    Easily annoyed or irritable 0 1 0  Afraid - awful might happen 0 0 0  Total GAD 7 Score 6 4 3   Anxiety Difficulty Not difficult at all Not difficult at all Not difficult at all       Fall Risk  01/18/2020 03/22/2018 08/11/2016 11/04/2015 11/04/2015  Falls in the past year? 1 No No No No  Number falls in past yr: 0 - - - -  Injury with Fall? 0 - - - -  Risk for fall due to : - - - Other (Comment) Other (Comment)  Follow up Education provided;Falls evaluation completed;Falls prevention discussed - - - -    Immunization History  Administered Date(s) Administered  . Fluad Quad(high Dose 65+) 07/18/2019  . Influenza, High Dose Seasonal PF 08/11/2016, 07/29/2017, 07/19/2018  . Influenza,inj,Quad PF,6+ Mos 07/11/2013, 08/07/2014, 07/11/2015  . Influenza-Unspecified 07/27/2017  . PFIZER SARS-COV-2 Vaccination 10/25/2019, 11/15/2019  . Pneumococcal Conjugate-13 08/11/2016, 11/16/2018  . Tdap 06/26/2014    Past Medical History:  Diagnosis Date  . Arthritis   . BCC (basal cell carcinoma of skin)   . Diverticulosis 11/2014   Noted on 11/2014 colonoscopy: ascending, descending, and sigmoid  . Essential tremor   . History of adenomatous polyp of colon 2007; 2016   Recall 5 yrs (Henry Knight)  . HTN (hypertension)   . Melanoma ([place])    chest  . Neuromuscular disorder ([place])    Allergies  Allergen Reactions  . Penicillins Rash and Other (See Comments)    Has patient had a PCN reaction causing immediate rash, facial/tongue/throat swelling, SOB or lightheadedness with hypotension: Unknown Has patient had a PCN reaction causing severe rash involving mucus membranes or skin necrosis: No Has patient had a PCN reaction that required hospitalization: No Has patient had a PCN reaction occurring within the last 10 years: No If all of the above answers are "NO", then may proceed with Cephalosporin use.    Past Surgical History:  Procedure Laterality Date  . COLONOSCOPY W/ POLYPECTOMY  approx 2007;  11/2014   Henry Knight 5 yrs   . COSMETIC SURGERY     laser peels, [facility] Plastic and reconstructive surgery Henry Knight) for acne scars  . KNEE SURGERY Right 2012  . SHOULDER ARTHROSCOPY W/ ROTATOR CUFF REPAIR  2004  . 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 Heart disease Father   . Alcohol abuse Father   . Obesity Brother   . Asperger's syndrome Brother   . Obesity Mother   . Tremor Mother   . 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 He lives with his wife & daughter.    Allergies as of 01/18/2020      Reactions   Penicillins Rash, Other (  See Comments)   Has patient had a PCN reaction causing immediate rash, facial/tongue/throat swelling, SOB or lightheadedness with hypotension: Unknown Has patient had a PCN reaction causing severe rash involving mucus membranes or skin necrosis: No Has patient had a PCN reaction that required hospitalization: No Has patient had a PCN reaction occurring within the last 10 years: No If all of the above answers are "NO", then may proceed with Cephalosporin use.      Medication List       Accurate as of January 18, 2020 11:59 PM. If you have any questions, ask your nurse or doctor.        Debrox 6.5 % OTIC solution Generic drug: carbamide peroxide Place 5 drops into both ears daily for 7 days. Started by: Howard Pouch, DO   LORazepam 1 MG tablet Commonly known as: ATIVAN Take 1 tablet (1 mg total) by mouth daily as needed for anxiety.   MULTIVITAMIN ADULT PO Take 1 tablet by mouth.   omega-3 acid ethyl esters 1 g capsule Commonly known as: LOVAZA Take 2 capsules (2 g total) by mouth 2 (two) times daily. What changed: additional instructions Changed by: Howard Pouch,  DO   traMADol 50 MG tablet Commonly known as: ULTRAM Take 1-2 tablets (50-100 mg total) by mouth every 12 (twelve) hours as needed (mild pain).   Turmeric 500 MG Caps Take 1 tablet by mouth.   Zoster Vaccine Adjuvanted injection Commonly known as: SHINGRIX Inject 0.5 mLs into the muscle once for 1 dose. Started by: Howard Pouch, DO      All past medical history, surgical history, allergies, family history, immunizations andmedications were updated in the EMR today and reviewed under the history and medication portions of their EMR.     Recent Results (from the past 2160 hour(s))  CBC     Status: Abnormal   Collection Time: 01/18/20  9:53 AM  Result Value Ref Range   WBC 3.7 (L) 4.0 - 10.5 K/uL   RBC 4.54 4.22 - 5.81 Mil/uL   Platelets 192.0 150.0 - 400.0 K/uL   Hemoglobin 15.1 13.0 - 17.0 g/dL   HCT 44.0 39.0 - 52.0 %   MCV 96.8 78.0 - 100.0 fl   MCHC 34.3 30.0 - 36.0 g/dL   RDW 12.8 11.5 - 15.5 %  Comprehensive metabolic panel     Status: Abnormal   Collection Time: 01/18/20  9:53 AM  Result Value Ref Range   Sodium 138 135 - 145 mEq/L   Potassium 4.4 3.5 - 5.1 mEq/L   Chloride 105 96 - 112 mEq/L   CO2 25 19 - 32 mEq/L   Glucose, Bld 103 (H) 70 - 99 mg/dL   BUN 21 6 - 23 mg/dL   Creatinine, Ser 0.90 0.40 - 1.50 mg/dL   Total Bilirubin 0.8 0.2 - 1.2 mg/dL   Alkaline Phosphatase 64 39 - 117 U/L   AST 22 0 - 37 U/L   ALT 20 0 - 53 U/L   Total Protein 6.7 6.0 - 8.3 g/dL   Albumin 4.5 3.5 - 5.2 g/dL   GFR 83.40 >60.00 mL/min   Calcium 9.0 8.4 - 10.5 mg/dL  Hemoglobin A1c     Status: None   Collection Time: 01/18/20  9:53 AM  Result Value Ref Range   Hgb A1c MFr Bld 5.3 4.6 - 6.5 %    Comment: Glycemic Control Guidelines for People with Diabetes:Non Diabetic:  <6%Goal of Therapy: <7%Additional Action Suggested:  >8%   Lipid  panel     Status: Abnormal   Collection Time: 01/18/20  9:53 AM  Result Value Ref Range   Cholesterol 192 0 - 200 mg/dL    Comment: ATP III  Classification       Desirable:  < 200 mg/dL               Borderline High:  200 - 239 mg/dL          High:  > = 240 mg/dL   Triglycerides 210.0 (H) 0.0 - 149.0 mg/dL    Comment: Normal:  <150 mg/dLBorderline High:  150 - 199 mg/dL   HDL 33.00 (L) >39.00 mg/dL   VLDL 42.0 (H) 0.0 - 40.0 mg/dL   Total CHOL/HDL Ratio 6     Comment:                Men          Women1/2 Average Risk     3.4          3.3Average Risk          5.0          4.42X Average Risk          9.6          7.13X Average Risk          15.0          11.0                       NonHDL 158.90     Comment: NOTE:  Non-HDL goal should be 30 mg/dL higher than patient's LDL goal (i.e. LDL goal of < 70 mg/dL, would have non-HDL goal of < 100 mg/dL)  PSA     Status: None   Collection Time: 01/18/20  9:53 AM  Result Value Ref Range   PSA 0.97 0.10 - 4.00 ng/mL    Comment: Test performed using Access Hybritech PSA Assay, a parmagnetic partical, chemiluminecent immunoassay.  TSH     Status: None   Collection Time: 01/18/20  9:53 AM  Result Value Ref Range   TSH 3.28 0.35 - 4.50 uIU/mL  LDL cholesterol, direct     Status: None   Collection Time: 01/18/20  9:53 AM  Result Value Ref Range   Direct LDL 103.0 mg/dL    Comment: Optimal:  <100 mg/dLNear or Above Optimal:  100-129 mg/dLBorderline High:  130-159 mg/dLHigh:  160-189 mg/dLVery High:  >190 mg/dL    No results found.   ROS: 14 pt review of systems performed and negative (unless mentioned in an HPI)  Objective: BP (!) 140/92 (BP Location: Left Arm, Patient Position: Sitting, Cuff Size: Normal)   Pulse 60   Temp 97.6 F (36.4 C) (Temporal)   Resp 17   Ht 6\' 1"  (1.854 m)   Wt 238 lb 4 oz (108.1 kg)   SpO2 96%   BMI 31.43 kg/m  Gen: Afebrile. No acute distress. Nontoxic in appearance, well-developed, well-nourished, obese male. HENT: AT. .  Left TM visualized and normal in appearance, normal external auditory canal. MMM, right TM with cerumen impaction unable to  visualize TM.no oral lesions, adequate dentition. Bilateral nares within normal limits. Throat without erythema, ulcerations or exudates.  No cough on exam, no hoarseness on exam. Eyes:Pupils Equal Round Reactive to light, Extraocular movements intact,  Conjunctiva without redness, discharge or icterus. Neck/lymp/endocrine: Supple, no lymphadenopathy, no thyromegaly CV: RRR no murmur, no edema, +2/4 P posterior tibialis pulses.  Chest: CTAB, no wheeze, rhonchi or crackles.  Normal respiratory effort.  Good air movement. Abd: Soft.  Flat. NTND. BS present.  No masses palpated. No hepatosplenomegaly. No rebound tenderness or guarding. Skin: No rashes, purpura or petechiae. Warm and well-perfused. Skin intact. Neuro/Msk:  Normal gait. PERLA. EOMi. Alert. Oriented x3.  Cranial nerves II through XII intact. Muscle strength 5/5 upper/lower extremity. DTRs equal bilaterally. Psych: Normal affect, dress and demeanor. Normal speech. Normal thought content and judgment.  No exam data present  Assessment/plan: MASTON VANETTEN is a 70 y.o. male present for CPE   Hypertriglyceridemia/Obesity (BMI 30-39.9) - BP with borderline control w/ diet and exercise currently. He has not been taking HCTZ - continue lovaza - diet and routine exercise.  -Continue to monitor with goal less than 130/80. - f/u 5.5 months.  Chronic prescription benzodiazepine use/Controlled substance agreement signed/Primary osteoarthritis involving multiple joints/Social anxiety disorder/ Essential tremor Medication and dose: Tramadol 50-100 Mg QD PRN for pain /Ativan 1 mg daily for tremor # pills per90d: tramadol/#90 Ativan--> both refilled today. Last UDS date:collected today Pain contract signed (Y/N): Y, updated 07/2019 Date narcotic database last reviewed (include red flags):  01/18/2020 F/u 5.5 months Impacted cerumen right ear: Prescribed Debrox solution to be used daily and follow-up early next week for recheck.  If  impaction is still present at that time will lavage in office for him.  Hopefully he will have removal at home with Debrox, but if not it will be softening the current earwax for easier lavage.  Preventive health Exam:  Patient was encouraged to exercise greater than 150 minutes a week. Patient was encouraged to choose a diet filled with fresh fruits and vegetables, and lean meats. AVS provided to patient today for education/recommendation on gender specific health and safety maintenance. Colonoscopy: last screen2/2016, recommend follow up5 years; resulted1 polyp. Completed byDr. Hung.>> referred today  Immunizations: tdap UTD 2015, influenzaUTD 2020, Prevnar completed-Pneumovax is needed, Shingrix printed for him last yr - reprinted today, covid pfizer series completed. Infectious disease screening: Hep C completed   Return in about 6 months (around 07/19/2020) for CMC (30 min) and 1 yr CPE.  Orders Placed This Encounter  Procedures  . CBC  . Comprehensive metabolic panel  . Hemoglobin A1c  . Lipid panel  . PSA  . TSH  . LDL cholesterol, direct  . Ambulatory referral to Gastroenterology   Meds ordered this encounter  Medications  . Zoster Vaccine Adjuvanted [facility]) injection    Sig: Inject 0.5 mLs into the muscle once for 1 dose.    Dispense:  0.5 mL    Refill:  1    Rpt dose once in 2-6 months.  . carbamide peroxide (DEBROX) 6.5 % OTIC solution    Sig: Place 5 drops into both ears daily for 7 days.    Dispense:  15 mL    Refill:  0  . traMADol (ULTRAM) 50 MG tablet    Sig: Take 1-2 tablets (50-100 mg total) by mouth every 12 (twelve) hours as needed (mild pain).    Dispense:  90 tablet    Refill:  1  . LORazepam (ATIVAN) 1 MG tablet    Sig: Take 1 tablet (1 mg total) by mouth daily as needed for anxiety.    Dispense:  90 tablet    Refill:  1  . DISCONTD: omega-3 acid ethyl esters (LOVAZA) 1 g capsule    Sig: Take 2 capsules (2 g total) by mouth 2 (two) times  daily.    Dispense:  120 capsule    Refill:  0  . omega-3 acid ethyl esters (LOVAZA) 1 g capsule    Sig: Take 2 capsules (2 g total) by mouth 2 (two) times daily.    Dispense:  120 capsule    Refill:  11    Referral Orders     Ambulatory referral to Gastroenterology  Note is dictated utilizing voice recognition software. Although note has been proof read prior to signing, occasional typographical errors still can be missed. If any questions arise, please do not hesitate to call for verification.  Electronically signed by: Howard Pouch, DO [place]

## 2020-01-18 NOTE — Patient Instructions (Addendum)
I have refilled your medications for you .  Follow up in 5.5 months on your chronic conditions.    Use the debrox ear solution once a day both ears and follow up in 1 week for recheck and ear lavage in the office.   Health Maintenance After Age 70 After age 46, you are at a higher risk for certain long-term diseases and infections as well as injuries from falls. Falls are a major cause of broken bones and head injuries in people who are older than age 70. Getting regular preventive care can help to keep you healthy and well. Preventive care includes getting regular testing and making lifestyle changes as recommended by your health care provider. Talk with your health care provider about:  Which screenings and tests you should have. A screening is a test that checks for a disease when you have no symptoms.  A diet and exercise plan that is right for you. What should I know about screenings and tests to prevent falls? Screening and testing are the best ways to find a health problem early. Early diagnosis and treatment give you the best chance of managing medical conditions that are common after age 81. Certain conditions and lifestyle choices may make you more likely to have a fall. Your health care provider may recommend:  Regular vision checks. Poor vision and conditions such as cataracts can make you more likely to have a fall. If you wear glasses, make sure to get your prescription updated if your vision changes.  Medicine review. Work with your health care provider to regularly review all of the medicines you are taking, including over-the-counter medicines. Ask your health care provider about any side effects that may make you more likely to have a fall. Tell your health care provider if any medicines that you take make you feel dizzy or sleepy.  Osteoporosis screening. Osteoporosis is a condition that causes the bones to get weaker. This can make the bones weak and cause them to break more  easily.  Blood pressure screening. Blood pressure changes and medicines to control blood pressure can make you feel dizzy.  Strength and balance checks. Your health care provider may recommend certain tests to check your strength and balance while standing, walking, or changing positions.  Foot health exam. Foot pain and numbness, as well as not wearing proper footwear, can make you more likely to have a fall.  Depression screening. You may be more likely to have a fall if you have a fear of falling, feel emotionally low, or feel unable to do activities that you used to do.  Alcohol use screening. Using too much alcohol can affect your balance and may make you more likely to have a fall. What actions can I take to lower my risk of falls? General instructions  Talk with your health care provider about your risks for falling. Tell your health care provider if: ? You fall. Be sure to tell your health care provider about all falls, even ones that seem minor. ? You feel dizzy, sleepy, or off-balance.  Take over-the-counter and prescription medicines only as told by your health care provider. These include any supplements.  Eat a healthy diet and maintain a healthy weight. A healthy diet includes low-fat dairy products, low-fat (lean) meats, and fiber from whole grains, beans, and lots of fruits and vegetables. Home safety  Remove any tripping hazards, such as rugs, cords, and clutter.  Install safety equipment such as grab bars in bathrooms and safety  rails on stairs.  Keep rooms and walkways well-lit. Activity   Follow a regular exercise program to stay fit. This will help you maintain your balance. Ask your health care provider what types of exercise are appropriate for you.  If you need a cane or walker, use it as recommended by your health care provider.  Wear supportive shoes that have nonskid soles. Lifestyle  Do not drink alcohol if your health care provider tells you not to  drink.  If you drink alcohol, limit how much you have: ? 0-1 drink a day for women. ? 0-2 drinks a day for men.  Be aware of how much alcohol is in your drink. In the [place] one drink equals one typical bottle of beer (12 oz), one-half glass of wine (5 oz), or one shot of hard liquor (1 oz).  Do not use any products that contain nicotine or tobacco, such as cigarettes and e-cigarettes. If you need help quitting, ask your health care provider. Summary  Having a healthy lifestyle and getting preventive care can help to protect your health and wellness after age 45.  Screening and testing are the best way to find a health problem early and help you avoid having a fall. Early diagnosis and treatment give you the best chance for managing medical conditions that are more common for people who are older than age 57.  Falls are a major cause of broken bones and head injuries in people who are older than age 71. Take precautions to prevent a fall at home.  Work with your health care provider to learn what changes you can make to improve your health and wellness and to prevent falls. This information is not intended to replace advice given to you by your health care provider. Make sure you discuss any questions you have with your health care provider. Document Revised: 01/11/2019 Document Reviewed: 08/03/2017 Elsevier Patient Education  2020 [employer].

## 2020-01-23 ENCOUNTER — Encounter: Payer: Self-pay | Admitting: Family Medicine

## 2020-01-23 ENCOUNTER — Other Ambulatory Visit: Payer: Self-pay

## 2020-01-23 ENCOUNTER — Ambulatory Visit (INDEPENDENT_AMBULATORY_CARE_PROVIDER_SITE_OTHER): Payer: Medicare Other | Admitting: Family Medicine

## 2020-01-23 VITALS — BP 138/88 | HR 64 | Temp 97.5°F | Resp 18 | Ht 73.0 in | Wt 236.6 lb

## 2020-01-23 DIAGNOSIS — H6123 Impacted cerumen, bilateral: Secondary | ICD-10-CM | POA: Diagnosis not present

## 2020-01-23 NOTE — Progress Notes (Signed)
This visit occurred during the SARS-CoV-2 public health emergency.  Safety protocols were in place, including screening questions prior to the visit, additional usage of staff PPE, and extensive cleaning of exam room while observing appropriate contact time as indicated for disinfecting solutions.    Henry Knight , 1951/01/27, 69 y.o., male MRN: [id number] Patient Care Team    Relationship Specialty Notifications Start End  Henry Hillock, DO PCP - General Family Medicine  07/11/15   Henry Ada, MD Consulting Physician Gastroenterology  08/11/16   Henry Arabian, MD Consulting Physician Orthopedic Surgery  08/11/16   Henry Quitter, MD Consulting Physician Otolaryngology  08/11/16   Sciences, [facility] (Inactive)    10/13/16    Comment: plastic and reconstructiion [facility] (on Henry Knight in [place])  Henry Monarch, MD Consulting Physician Dermatology  06/30/17     Chief Complaint  Patient presents with  . Cerumen Impaction    both ears, Pt states using drops to soften wax.     Subjective: Pt presents for an OV for cerumen impaction. He was seen last week for his CPE and noted cerumen impaction. He has been using the debrox for 5 days. He has not had relief in his symptoms and reports decreased hearing. He is interested in lavage today.   Depression screen [facility] 2/9 01/18/2020 07/18/2019 11/16/2018 03/22/2018 08/11/2016  Decreased Interest 0 1 0 0 0  Down, Depressed, Hopeless 0 0 0 0 0  PHQ - 2 Score 0 1 0 0 0  Altered sleeping - - 1 - -  Tired, decreased energy - - 1 - -  Change in appetite - - 0 - -  Feeling bad or failure about yourself  - - 0 - -  Trouble concentrating - - 0 - -  Moving slowly or fidgety/restless - - 0 - -  Suicidal thoughts - - 0 - -  PHQ-9 Score - - 2 - -  Difficult doing work/chores - - Not difficult at all - -    Allergies  Allergen Reactions  . Penicillins Rash and Other (See Comments)    Has patient had a PCN reaction causing immediate  rash, facial/tongue/throat swelling, SOB or lightheadedness with hypotension: Unknown Has patient had a PCN reaction causing severe rash involving mucus membranes or skin necrosis: No Has patient had a PCN reaction that required hospitalization: No Has patient had a PCN reaction occurring within the last 10 years: No If all of the above answers are "NO", then may proceed with Cephalosporin use.    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 He lives with his wife & daughter.   Past Medical History:  Diagnosis Date  . Arthritis   . BCC (basal cell carcinoma of skin)   . Diverticulosis 11/2014   Noted on 11/2014 colonoscopy: ascending, descending, and sigmoid  . Essential tremor   . History of adenomatous polyp of colon 2007; 2016   Recall 5 yrs (Dr. Benson Knight)  . HTN (hypertension)   . Melanoma ([place])    chest  . Neuromuscular disorder [facility])    Past Surgical History:  Procedure Laterality Date  . COLONOSCOPY W/ POLYPECTOMY  approx 2007; 11/2014   Dr. Marcy Knight 5 yrs   . COSMETIC SURGERY     laser peels, [facility] Plastic and reconstructive surgery Henry Knight) for acne scars  . KNEE SURGERY Right 2012  . SHOULDER ARTHROSCOPY W/ ROTATOR CUFF REPAIR  2004  .  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 Heart disease Father   . Alcohol abuse Father   . Obesity Brother   . Asperger's syndrome Brother   . Obesity Mother   . Tremor Mother   . Anxiety disorder Mother    Allergies as of 01/23/2020      Reactions   Penicillins Rash, Other (See Comments)   Has patient had a PCN reaction causing immediate rash, facial/tongue/throat swelling, SOB or lightheadedness with hypotension: Unknown Has patient had a PCN reaction causing severe rash involving mucus  membranes or skin necrosis: No Has patient had a PCN reaction that required hospitalization: No Has patient had a PCN reaction occurring within the last 10 years: No If all of the above answers are "NO", then may proceed with Cephalosporin use.      Medication List       Accurate as of January 23, 2020  8:37 AM. If you have any questions, ask your nurse or doctor.        Debrox 6.5 % OTIC solution Generic drug: carbamide peroxide Place 5 drops into both ears daily for 7 days.   LORazepam 1 MG tablet Commonly known as: ATIVAN Take 1 tablet (1 mg total) by mouth daily as needed for anxiety.   MULTIVITAMIN ADULT PO Take 1 tablet by mouth.   omega-3 acid ethyl esters 1 g capsule Commonly known as: LOVAZA Take 2 capsules (2 g total) by mouth 2 (two) times daily.   traMADol 50 MG tablet Commonly known as: ULTRAM Take 1-2 tablets (50-100 mg total) by mouth every 12 (twelve) hours as needed (mild pain).   Turmeric 500 MG Caps Take 1 tablet by mouth.       All past medical history, surgical history, allergies, family history, immunizations andmedications were updated in the EMR today and reviewed under the history and medication portions of their EMR.     ROS: Negative, with the exception of above mentioned in HPI   Objective:  BP 138/88   Pulse 64   Temp (!) 97.5 F (36.4 C) (Temporal)   Resp 18   Ht 6\' 1"  (1.854 m)   Wt 236 lb 9.6 oz (107.3 kg)   SpO2 97%   BMI 31.22 kg/m  Body mass index is 31.22 kg/m. Gen: Afebrile. No acute distress. Nontoxic in appearance, well developed, well nourished.  HENT: AT. [place]. Bilateral TM unable to visualize d/t cerumen impaction. .  Neuro: Normal gait. PERLA. EOMi. Alert. Oriented x3  No exam data present No results found. No results found for this or any previous visit (from the past 24 hour(s)).  Assessment/Plan: Henry Knight is a 69 y.o. male present for OV for  Bilateral hearing loss due to cerumen  impaction Procedure: Cerumen disimpaction Water-peroxide solution was applied and gentle ear lavage performed on bilateral ear(s).  There were no complications.  Tympanic membrane was visualized after disimpaction.  Tympanic membrane(s) intact and normal.  Auditory canal(s) free of cerumen, without erythema.  Patient tolerated procedure well.  Patient reported relief of symptoms after removal of cerumen.    Reviewed expectations re: course of current medical issues.  Discussed self-management of symptoms.  Outlined signs and symptoms indicating need for more acute intervention.  Patient verbalized understanding and all questions were answered.  Patient  received an Scientific laboratory technician.    No orders of the defined types were placed in this encounter.  No orders of the defined types were placed in this encounter.  Referral Orders  No referral(s) requested today     Note is dictated utilizing voice recognition software. Although note has been proof read prior to signing, occasional typographical errors still can be missed. If any questions arise, please do not hesitate to call for verification.   electronically signed by:  Howard Pouch, DO  [place]

## 2020-01-23 NOTE — Patient Instructions (Addendum)
You can continue to use the debrox solution once  monthly to help control cerumen build up if needed. This is also available over the counter.    You may notice some irritation feeling today or tomorrow. If this occurs it  Is temporary. Over the counter pain reliever will help.     Earwax Buildup, Adult The ears produce a substance called earwax that helps keep bacteria out of the ear and protects the skin in the ear canal. Occasionally, earwax can build up in the ear and cause discomfort or hearing loss. What increases the risk? This condition is more likely to develop in people who:  Are male.  Are elderly.  Naturally produce more earwax.  Clean their ears often with cotton swabs.  Use earplugs often.  Use in-ear headphones often.  Wear hearing aids.  Have narrow ear canals.  Have earwax that is overly thick or sticky.  Have eczema.  Are dehydrated.  Have excess hair in the ear canal. What are the signs or symptoms? Symptoms of this condition include:  Reduced or muffled hearing.  A feeling of fullness in the ear or feeling that the ear is plugged.  Fluid coming from the ear.  Ear pain.  Ear itch.  Ringing in the ear.  Coughing.  An obvious piece of earwax that can be seen inside the ear canal. How is this diagnosed? This condition may be diagnosed based on:  Your symptoms.  Your medical history.  An ear exam. During the exam, your health care provider will look into your ear with an instrument called an otoscope. You may have tests, including a hearing test. How is this treated? This condition may be treated by:  Using ear drops to soften the earwax.  Having the earwax removed by a health care provider. The health care provider may: ? Flush the ear with water. ? Use an instrument that has a loop on the end (curette). ? Use a suction device.  Surgery to remove the wax buildup. This may be done in severe cases. Follow these instructions at  home:   Take over-the-counter and prescription medicines only as told by your health care provider.  Do not put any objects, including cotton swabs, into your ear. You can clean the opening of your ear canal with a washcloth or facial tissue.  Follow instructions from your health care provider about cleaning your ears. Do not over-clean your ears.  Drink enough fluid to keep your urine clear or pale yellow. This will help to thin the earwax.  Keep all follow-up visits as told by your health care provider. If earwax builds up in your ears often or if you use hearing aids, consider seeing your health care provider for routine, preventive ear cleanings. Ask your health care provider how often you should schedule your cleanings.  If you have hearing aids, clean them according to instructions from the manufacturer and your health care provider. Contact a health care provider if:  You have ear pain.  You develop a fever.  You have blood, pus, or other fluid coming from your ear.  You have hearing loss.  You have ringing in your ears that does not go away.  Your symptoms do not improve with treatment.  You feel like the room is spinning (vertigo). Summary  Earwax can build up in the ear and cause discomfort or hearing loss.  The most common symptoms of this condition include reduced or muffled hearing and a feeling of fullness in  the ear or feeling that the ear is plugged.  This condition may be diagnosed based on your symptoms, your medical history, and an ear exam.  This condition may be treated by using ear drops to soften the earwax or by having the earwax removed by a health care provider.  Do not put any objects, including cotton swabs, into your ear. You can clean the opening of your ear canal with a washcloth or facial tissue. This information is not intended to replace advice given to you by your health care provider. Make sure you discuss any questions you have with your  health care provider. Document Revised: 09/02/2017 Document Reviewed: 12/01/2016 Elsevier Patient Education  2020 [employer].

## 2020-01-30 ENCOUNTER — Encounter: Payer: Self-pay | Admitting: Family Medicine

## 2020-02-12 ENCOUNTER — Other Ambulatory Visit: Payer: Self-pay | Admitting: Family Medicine

## 2020-02-12 DIAGNOSIS — E781 Pure hyperglyceridemia: Secondary | ICD-10-CM

## 2020-03-05 ENCOUNTER — Other Ambulatory Visit: Payer: Self-pay | Admitting: Family Medicine

## 2020-03-05 DIAGNOSIS — E781 Pure hyperglyceridemia: Secondary | ICD-10-CM

## 2020-03-06 NOTE — Telephone Encounter (Signed)
Called pharmacy and they never received medication refill electronically. Resent RX.

## 2020-03-27 ENCOUNTER — Other Ambulatory Visit: Payer: Self-pay

## 2020-03-27 ENCOUNTER — Ambulatory Visit (INDEPENDENT_AMBULATORY_CARE_PROVIDER_SITE_OTHER): Payer: Medicare Other | Admitting: Family Medicine

## 2020-03-27 ENCOUNTER — Encounter: Payer: Self-pay | Admitting: Family Medicine

## 2020-03-27 VITALS — BP 156/95 | HR 57 | Temp 97.9°F | Resp 16 | Ht 73.0 in | Wt 234.0 lb

## 2020-03-27 DIAGNOSIS — Z23 Encounter for immunization: Secondary | ICD-10-CM | POA: Diagnosis not present

## 2020-03-27 DIAGNOSIS — L84 Corns and callosities: Secondary | ICD-10-CM

## 2020-03-27 DIAGNOSIS — S90859A Superficial foreign body, unspecified foot, initial encounter: Secondary | ICD-10-CM

## 2020-03-27 NOTE — Progress Notes (Signed)
This visit occurred during the SARS-CoV-2 public health emergency.  Safety protocols were in place, including screening questions prior to the visit, additional usage of staff PPE, and extensive cleaning of exam room while observing appropriate contact time as indicated for disinfecting solutions.    Henry Knight , August 29, 1951, 69 y.o., male MRN: [id number] Patient Care Team    Relationship Specialty Notifications Start End  Ma Hillock, DO PCP - General Family Medicine  07/11/15   Carol Ada, MD Consulting Physician Gastroenterology  08/11/16   Gaynelle Arabian, MD Consulting Physician Orthopedic Surgery  08/11/16   Melida Quitter, MD Consulting Physician Otolaryngology  08/11/16   Sciences, [facility] (Inactive)    10/13/16    Comment: plastic and reconstructiion [facility] (on [name] in [place])  Lavonna Monarch, MD Consulting Physician Dermatology  06/30/17     Chief Complaint  Patient presents with  . Foot Pain    R foot pain x2 weeks. He believes something is stuck in the bottom of foot.      Subjective: Pt presents for an OV with complaints of foreign body of left foot of unknown duration.  He stubbed his toe 2 weeks ago and he is uncertain if a splinter or a piece of glass entered his foot at the same time.  He did notice at that time that a spot on the bottom of his foot became tender and sore.  He had his wife look at it who felt there might be a splinter or a piece of glass located in the area where he was feeling discomfort.  He states if he wears his shoes is not uncomfortable but when walking in his bare feet around the house he notices more pain.  He denies fevers, chills, bleeding or drainage from location.  Depression screen [facility] 2/9 01/18/2020 07/18/2019 11/16/2018 03/22/2018 08/11/2016  Decreased Interest 0 1 0 0 0  Down, Depressed, Hopeless 0 0 0 0 0  PHQ - 2 Score 0 1 0 0 0  Altered sleeping - - 1 - -  Tired, decreased energy - - 1 - -  Change in  appetite - - 0 - -  Feeling bad or failure about yourself  - - 0 - -  Trouble concentrating - - 0 - -  Moving slowly or fidgety/restless - - 0 - -  Suicidal thoughts - - 0 - -  PHQ-9 Score - - 2 - -  Difficult doing work/chores - - Not difficult at all - -    Allergies  Allergen Reactions  . Penicillins Rash and Other (See Comments)    Has patient had a PCN reaction causing immediate rash, facial/tongue/throat swelling, SOB or lightheadedness with hypotension: Unknown Has patient had a PCN reaction causing severe rash involving mucus membranes or skin necrosis: No Has patient had a PCN reaction that required hospitalization: No Has patient had a PCN reaction occurring within the last 10 years: No If all of the above answers are "NO", then may proceed with Cephalosporin use.    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   Past Medical History:  Diagnosis Date  . Arthritis   . BCC (basal cell carcinoma of skin)   . Diverticulosis 11/2014   Noted on 11/2014 colonoscopy: ascending, descending, and sigmoid  . Essential tremor   . History of adenomatous polyp of colon 2007; 2016  Recall 5 yrs (Dr. Benson Norway)  . HTN (hypertension)   . Melanoma ([place])    chest  . Neuromuscular disorder [facility])    Past Surgical History:  Procedure Laterality Date  . COLONOSCOPY W/ POLYPECTOMY  approx 2007; 11/2014   Dr. Marcy Panning 5 yrs   . COSMETIC SURGERY     laser peels, [facility] Plastic and reconstructive surgery Clearence Cheek) for acne scars  . KNEE SURGERY Right 2012  . SHOULDER ARTHROSCOPY W/ ROTATOR CUFF REPAIR  2004  . 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 Heart disease Father   . Alcohol abuse Father   . Obesity Brother   .  Asperger's syndrome Brother   . Obesity Mother   . Tremor Mother   . Anxiety disorder Mother    Allergies as of 03/27/2020      Reactions   Penicillins Rash, Other (See Comments)   Has patient had a PCN reaction causing immediate rash, facial/tongue/throat swelling, SOB or lightheadedness with hypotension: Unknown Has patient had a PCN reaction causing severe rash involving mucus membranes or skin necrosis: No Has patient had a PCN reaction that required hospitalization: No Has patient had a PCN reaction occurring within the last 10 years: No If all of the above answers are "NO", then may proceed with Cephalosporin use.      Medication List       Accurate as of March 27, 2020  1:36 PM. If you have any questions, ask your nurse or doctor.        LORazepam 1 MG tablet Commonly known as: ATIVAN Take 1 tablet (1 mg total) by mouth daily as needed for anxiety.   MULTIVITAMIN ADULT PO Take 1 tablet by mouth.   omega-3 acid ethyl esters 1 g capsule Commonly known as: LOVAZA TAKE 2 CAPSULES (2 G TOTAL) BY MOUTH 2 (TWO) TIMES DAILY.   traMADol 50 MG tablet Commonly known as: ULTRAM Take 1-2 tablets (50-100 mg total) by mouth every 12 (twelve) hours as needed (mild pain).   Turmeric 500 MG Caps Take 1 tablet by mouth.       All past medical history, surgical history, allergies, family history, immunizations andmedications were updated in the EMR today and reviewed under the history and medication portions of their EMR.     ROS: Negative, with the exception of above mentioned in HPI   Objective:  BP (!) 156/95 (BP Location: Left Arm, Patient Position: Sitting, Cuff Size: Large)   Pulse (!) 57   Temp 97.9 F (36.6 C) (Temporal)   Resp 16   Ht 6\' 1"  (1.854 m)   Wt 234 lb (106.1 kg)   SpO2 97%   BMI 30.87 kg/m  Body mass index is 30.87 kg/m. Gen: Afebrile. No acute distress. Nontoxic in appearance, well developed, well nourished.  HENT: AT. [place].  Skin: no rashes, purpura  or petechiae. ~1 cm round callus formation over foreign body appreciated under magnification plantar aspect of foot just below the third metatarsal head. Neuro: Normal gait. PERLA. EOMi. Alert. Oriented x3   No exam data present No results found. No results found for this or any previous visit (from the past 24 hour(s)).  Assessment/Plan: Henry Knight is a 69 y.o. male present for OV for  Callus of foot/Splinter of foot without infection,  initial encounter Procedure Note:  PROCEDURE NOTE: Removal of foreign body, simple Performed by: Dr. Raoul Pitch Indication: Foreign body plantar aspect of foot-pain Anesthesia was obtained with 1 ml of 1% lidocaine 9. The area was prepped in the usual sterile fashion. A number 11 scalpel was used to gently remove callus formation.  Foreign body/splinter was then easily removed without difficulty.  No drainage appreciated.  Antibiotic ointment, 2 x 2 with pressure dressing was then applied.  Patient tolerated procedure well.  Wound care instructions were provided. Post-Procedure Diagnosis: Foreign body foot-splinter Complications: None Estimated Blood Loss: None - Td vaccine greater than or equal to 7yo preservative free IM Patient to monitor area for any signs of infection.  If healing well no follow-up needed.   Reviewed expectations re: course of current medical issues.  Discussed self-management of symptoms.  Outlined signs and symptoms indicating need for more acute intervention.  Patient verbalized understanding and all questions were answered.  Patient received an After-Visit Summary.    Orders Placed This Encounter  Procedures  . Td vaccine greater than or equal to 7yo preservative free IM   No orders of the defined types were placed in this encounter.  Referral Orders  No referral(s) requested today     Note is dictated utilizing voice recognition software. Although note has been proof read prior to signing, occasional  typographical errors still can be missed. If any questions arise, please do not hesitate to call for verification.   electronically signed by:  Howard Pouch, DO  [place]

## 2020-03-27 NOTE — Patient Instructions (Signed)
Callus and small piece of splinter was removed today. It appears completely removed.  Keep area clean and covered. Neosporin and band aid should be fine after today.  Clean with peroxide at least once a day next few days.   Watch for any signs of infection (redness, swelling, pus-like drainage). If this occurs return for check.

## 2020-06-25 ENCOUNTER — Encounter: Payer: Self-pay | Admitting: Dermatology

## 2020-06-25 ENCOUNTER — Ambulatory Visit: Payer: Medicare Other | Admitting: Dermatology

## 2020-06-25 ENCOUNTER — Other Ambulatory Visit: Payer: Self-pay

## 2020-06-25 DIAGNOSIS — Z1283 Encounter for screening for malignant neoplasm of skin: Secondary | ICD-10-CM

## 2020-06-25 DIAGNOSIS — L820 Inflamed seborrheic keratosis: Secondary | ICD-10-CM

## 2020-06-25 DIAGNOSIS — L570 Actinic keratosis: Secondary | ICD-10-CM

## 2020-06-25 DIAGNOSIS — S91331A Puncture wound without foreign body, right foot, initial encounter: Secondary | ICD-10-CM | POA: Diagnosis not present

## 2020-06-25 DIAGNOSIS — L821 Other seborrheic keratosis: Secondary | ICD-10-CM

## 2020-06-25 NOTE — Progress Notes (Signed)
LN2 x 3 on scalp LN2 x 1 on right forearm

## 2020-06-26 ENCOUNTER — Ambulatory Visit: Payer: Medicare Other | Admitting: Physician Assistant

## 2020-07-24 NOTE — Progress Notes (Signed)
   Follow-Up Visit   Subjective  Henry Knight is a 69 y.o. male who presents for the following: Skin Problem (GLASS IN FOOT RIGHT FOOT, GP TRIED TO GET IT OUT NOT) and Annual Exam (skin check).  Annual skin exam and possible foreign body in foot.  Location:  Duration:  Quality:  Associated Signs/Symptoms: Modifying Factors:  Severity:  Timing: Context:   Objective  Well appearing patient in no apparent distress; mood and affect are within normal limits.  A full examination was performed including scalp, head, eyes, ears, nose, lips, neck, chest, axillae, abdomen, back, buttocks, bilateral upper extremities, bilateral lower extremities, hands, feet, fingers, toes, fingernails, and toenails. All findings within normal limits unless otherwise noted below.   Assessment & Plan    Encounter for screening for malignant neoplasm of skin Chest - Medial (Center)  Yearly skin check  AK (actinic keratosis) (4) Right Forearm - Posterior; Mid Frontal Scalp (2); Mid Forehead  Destruction of lesion - Mid Forehead, Mid Frontal Scalp (2), Right Forearm - Posterior Complexity: simple   Destruction method: cryotherapy   Informed consent: discussed and consent obtained   Timeout:  patient name, date of birth, surgical site, and procedure verified Lesion destroyed using liquid nitrogen: Yes   Cryotherapy cycles:  1 Outcome: patient tolerated procedure well with no complications   Post-procedure details: wound care instructions given    Seborrheic keratosis Right Lower Leg - Posterior  Destruction of lesion - Right Lower Leg - Posterior Complexity: simple   Destruction method: cryotherapy   Informed consent: discussed and consent obtained   Timeout:  patient name, date of birth, surgical site, and procedure verified Lesion destroyed using liquid nitrogen: Yes   Cryotherapy cycles:  1 Outcome: patient tolerated procedure well with no complications   Post-procedure details: wound  care instructions given    Skin cancer screening performed today.    I, Lavonna Monarch, MD, have reviewed all documentation for this visit.  The documentation on 07/24/20 for the exam, diagnosis, procedures, and orders are all accurate and complete.

## 2020-07-28 ENCOUNTER — Encounter: Payer: Self-pay | Admitting: Dermatology

## 2020-08-19 ENCOUNTER — Ambulatory Visit: Payer: Medicare Other | Admitting: Dermatology

## 2020-08-24 ENCOUNTER — Other Ambulatory Visit: Payer: Self-pay | Admitting: Family Medicine

## 2020-08-24 DIAGNOSIS — E781 Pure hyperglyceridemia: Secondary | ICD-10-CM

## 2020-09-24 DIAGNOSIS — Z20822 Contact with and (suspected) exposure to covid-19: Secondary | ICD-10-CM | POA: Diagnosis not present

## 2020-10-21 ENCOUNTER — Telehealth: Payer: Self-pay | Admitting: Family Medicine

## 2020-10-21 NOTE — Telephone Encounter (Signed)
Patient wants to know if, for travel reasons, he can do a home Covid rapid test and bring it in our office for Dr. Raoul Pitch to "certify." He needs to do this on November 03, 2020. Please call patient to advise and schedule if appropriate.

## 2020-10-21 NOTE — Telephone Encounter (Signed)
Patient returned call. I relayed below message and provided patient with [facility] Covid testing site scheduling number [phone].

## 2020-10-21 NOTE — Telephone Encounter (Signed)
LVM for pt to CB regarding home test questions.   Note: Office will only validate test done by our office and we do not do rapid testing here. Pt will need to make appointment with one of our testing sites or a local site near him

## 2020-10-31 DIAGNOSIS — Z03818 Encounter for observation for suspected exposure to other biological agents ruled out: Secondary | ICD-10-CM | POA: Diagnosis not present

## 2020-11-03 ENCOUNTER — Other Ambulatory Visit: Payer: Self-pay

## 2020-11-03 DIAGNOSIS — G250 Essential tremor: Secondary | ICD-10-CM

## 2020-11-03 DIAGNOSIS — Z03818 Encounter for observation for suspected exposure to other biological agents ruled out: Secondary | ICD-10-CM | POA: Diagnosis not present

## 2020-11-03 DIAGNOSIS — Z20822 Contact with and (suspected) exposure to covid-19: Secondary | ICD-10-CM | POA: Diagnosis not present

## 2020-11-03 NOTE — Telephone Encounter (Signed)
Pt will call back to schedule OV within the month

## 2020-11-03 NOTE — Telephone Encounter (Signed)
Please follow controlled substance protocol. 

## 2020-11-03 NOTE — Telephone Encounter (Signed)
Patient refill request  LORazepam (ATIVAN) 1 MG tablet [[id number]]    [facility]

## 2020-11-03 NOTE — Telephone Encounter (Signed)
Requesting: ativan  Contract: 07/18/2019 UDS: 07/18/2019 Last Visit:01/18/20 Next Visit: pt will call to schedule within the month Last Refill:01/18/20 (90,1)  Please Advise

## 2020-11-19 ENCOUNTER — Other Ambulatory Visit: Payer: Self-pay

## 2020-11-20 ENCOUNTER — Encounter: Payer: Self-pay | Admitting: Family Medicine

## 2020-11-20 ENCOUNTER — Ambulatory Visit (INDEPENDENT_AMBULATORY_CARE_PROVIDER_SITE_OTHER): Payer: Medicare Other | Admitting: Family Medicine

## 2020-11-20 VITALS — BP 126/80 | HR 64 | Temp 98.2°F | Ht 73.0 in

## 2020-11-20 DIAGNOSIS — E781 Pure hyperglyceridemia: Secondary | ICD-10-CM | POA: Diagnosis not present

## 2020-11-20 DIAGNOSIS — E669 Obesity, unspecified: Secondary | ICD-10-CM

## 2020-11-20 DIAGNOSIS — F4010 Social phobia, unspecified: Secondary | ICD-10-CM | POA: Diagnosis not present

## 2020-11-20 DIAGNOSIS — G250 Essential tremor: Secondary | ICD-10-CM

## 2020-11-20 DIAGNOSIS — Z79899 Other long term (current) drug therapy: Secondary | ICD-10-CM

## 2020-11-20 DIAGNOSIS — M179 Osteoarthritis of knee, unspecified: Secondary | ICD-10-CM

## 2020-11-20 DIAGNOSIS — Z96653 Presence of artificial knee joint, bilateral: Secondary | ICD-10-CM

## 2020-11-20 DIAGNOSIS — I10 Essential (primary) hypertension: Secondary | ICD-10-CM

## 2020-11-20 DIAGNOSIS — M7061 Trochanteric bursitis, right hip: Secondary | ICD-10-CM

## 2020-11-20 DIAGNOSIS — M1710 Unilateral primary osteoarthritis, unspecified knee: Secondary | ICD-10-CM

## 2020-11-20 MED ORDER — TRAMADOL HCL 50 MG PO TABS
50.0000 mg | ORAL_TABLET | Freq: Two times a day (BID) | ORAL | 1 refills | Status: DC | PRN
Start: 2020-11-20 — End: 2021-06-18

## 2020-11-20 MED ORDER — OMEGA-3-ACID ETHYL ESTERS 1 G PO CAPS: 2.0000 g | ORAL_CAPSULE | Freq: Two times a day (BID) | ORAL | 3 refills | Status: DC

## 2020-11-20 MED ORDER — LORAZEPAM 1 MG PO TABS: 1.0000 mg | ORAL_TABLET | Freq: Every day | ORAL | 1 refills | Status: DC | PRN

## 2020-11-20 NOTE — Progress Notes (Signed)
This visit occurred during the SARS-CoV-2 public health emergency.  Safety protocols were in place, including screening questions prior to the visit, additional usage of staff PPE, and extensive cleaning of exam room while observing appropriate contact time as indicated for disinfecting solutions.    Patient ID: Henry Knight, male  DOB: Feb 26, 1951, 70 y.o.   MRN: [id number] Patient Care Team    Relationship Specialty Notifications Start End  Ma Hillock, DO PCP - General Family Medicine  07/11/15   Carol Ada, MD Consulting Physician Gastroenterology  08/11/16   Gaynelle Arabian, MD Consulting Physician Orthopedic Surgery  08/11/16   Melida Quitter, MD Consulting Physician Otolaryngology  08/11/16   Sciences, [facility] (Inactive)    10/13/16    Comment: plastic and reconstructiion [facility] (on [name] in [place])  Lavonna Monarch, MD Consulting Physician Dermatology  06/30/17     Chief Complaint  Patient presents with  . Follow-up    [facility]; pt is not fasting    Subjective:  Henry Knight is a 70 y.o. male present for [facility] HLD/obesity:  Refused statin.  Started fenofibrate and then refused. He has refused many blood pressure medicines in the past as well. He is taking  his fish oil.  Arthritis/chronic pain: pt reports compliant with tramadol. He rarely uses now. as neededfor pain- taking about 3-4x/mo.   Indication for chronic opioid: osteoarthiritis Medication and dose: Tramadol 50-100 Mg QD PRN for pain /Ativan 1 mg daily for tremor/anxiety (about 15-20 times a month-sometimes 2 times a day but rarely).  # pills per90d:#30 tramadol/#180 Ativan#90 Last UDS date:next appt Pain contract signed (Y/N): Y Date narcotic database last reviewed (include red flags):  11/20/20  Essential tremor/anxiety: Pt has h/o essential tremor and is prescribed ativan 1 mg QD PRN. He has used the medication for years and reports no side effects from medicine.    Patient  reports the Ativan works well enough.  He had looked into other types of therapy with brain stimulators etc. and decided he would not like to go down the road and would stay on Ativan.  Depression screen [facility] 2/9 11/20/2020 11/20/2020 01/18/2020 07/18/2019 11/16/2018  Decreased Interest 1 0 0 1 0  Down, Depressed, Hopeless 0 0 0 0 0  PHQ - 2 Score 1 0 0 1 0  Altered sleeping 1 - - - 1  Tired, decreased energy 1 - - - 1  Change in appetite 0 - - - 0  Feeling bad or failure about yourself  1 - - - 0  Trouble concentrating 0 - - - 0  Moving slowly or fidgety/restless 0 - - - 0  Suicidal thoughts 0 - - - 0  PHQ-9 Score 4 - - - 2  Difficult doing work/chores Not difficult at all - - - Not difficult at all   GAD 7 : Generalized Anxiety Score 11/20/2020 01/18/2020 07/18/2019 11/16/2018  Nervous, Anxious, on Edge 1 0 1 1  Control/stop worrying 1 0 0 0  Worry too much - different things 1 0 0 0  Trouble relaxing 1 3 1 1   Restless 1 3 1 1   Easily annoyed or irritable 1 0 1 0  Afraid - awful might happen 0 0 0 0  Total GAD 7 Score 6 6 4 3   Anxiety Difficulty Not difficult at all Not difficult at all Not difficult at all Not difficult at all       Fall Risk  11/20/2020 01/18/2020 03/22/2018 08/11/2016  11/04/2015  Falls in the past year? 0 1 No No No  Number falls in past yr: 0 0 - - -  Injury with Fall? 0 0 - - -  Risk for fall due to : - - - - Other (Comment)  Follow up Falls evaluation completed Education provided;Falls evaluation completed;Falls prevention discussed - - -    Immunization History  Administered Date(s) Administered  . Fluad Quad(high Dose 65+) 07/18/2019  . Influenza, High Dose Seasonal PF 08/11/2016, 07/29/2017, 07/19/2018  . Influenza,inj,Quad PF,6+ Mos 07/11/2013, 08/07/2014, 07/11/2015  . Influenza-Unspecified 07/27/2017, 08/02/2020  . PFIZER(Purple Top)SARS-COV-2 Vaccination 10/25/2019, 11/15/2019  . Pneumococcal Conjugate-13 08/11/2016, 11/16/2018  . Td 03/27/2020  . Tdap  06/26/2014    Past Medical History:  Diagnosis Date  . Arthritis   . BCC (basal cell carcinoma of skin) 07/11/2019   BCC LOWER BACK CX3 5FU  . Diverticulosis 11/2014   Noted on 11/2014 colonoscopy: ascending, descending, and sigmoid  . Essential tremor   . History of adenomatous polyp of colon 2007; 2016   Recall 5 yrs (Dr. Benson Norway)  . HTN (hypertension)   . Melanoma ([place]) [id number]   MM 2 CHEST TX EXC  . Neuromuscular disorder ([place])   . SCC (squamous cell carcinoma) 05/15/2007   SCCA RIGHT FOREARM TX WITH BX  . SCC (squamous cell carcinoma) 07/04/2013   WELL DIFF SCC LEFT FOREARM CX3 5FU  . SCC (squamous cell carcinoma) 06/26/2014   SCC IN SITU TOP OF SCALP TX CX3 5FU  . SCC (squamous cell carcinoma) 07/27/2017   SCC IN SITU RIGHT SHOULDER TX CX3 5FU  . Squamous cell carcinoma of skin 11/10/2004   SCC IN SITU RIGHT FOREARM CX3 5FU   Allergies  Allergen Reactions  . Penicillins Rash and Other (See Comments)    Has patient had a PCN reaction causing immediate rash, facial/tongue/throat swelling, SOB or lightheadedness with hypotension: Unknown Has patient had a PCN reaction causing severe rash involving mucus membranes or skin necrosis: No Has patient had a PCN reaction that required hospitalization: No Has patient had a PCN reaction occurring within the last 10 years: No If all of the above answers are "NO", then may proceed with Cephalosporin use.    Past Surgical History:  Procedure Laterality Date  . COLONOSCOPY W/ POLYPECTOMY  approx 2007; 11/2014   Dr. Marcy Panning 5 yrs   . COSMETIC SURGERY     laser peels, [facility] Plastic and reconstructive surgery Clearence Cheek) for acne scars  . KNEE SURGERY Right 2012  . SHOULDER ARTHROSCOPY W/ ROTATOR CUFF REPAIR  2004  . TOTAL KNEE ARTHROPLASTY Left 10/10/2017   Procedure: LEFT TOTAL KNEE ARTHROPLASTY;  Surgeon: Gaynelle Arabian, MD;  Location: [facility]S;  Service: Orthopedics;  Laterality: Left;  50 mins   Family History  Problem  Relation Age of Onset  . Heart disease Father   . Alcohol abuse Father   . Obesity Brother   . Asperger's syndrome Brother   . Obesity Mother   . Tremor Mother   . 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 He lives with his wife & daughter.    Allergies as of 11/20/2020      Reactions   Penicillins Rash, Other (See Comments)   Has patient had a PCN reaction causing immediate rash, facial/tongue/throat swelling, SOB or lightheadedness with hypotension: Unknown Has patient had a PCN reaction causing severe rash involving mucus membranes or  skin necrosis: No Has patient had a PCN reaction that required hospitalization: No Has patient had a PCN reaction occurring within the last 10 years: No If all of the above answers are "NO", then may proceed with Cephalosporin use.      Medication List       Accurate as of November 20, 2020 10:53 AM. If you have any questions, ask your nurse or doctor.        LORazepam 1 MG tablet Commonly known as: ATIVAN Take 1 tablet (1 mg total) by mouth daily as needed for anxiety.   MULTIVITAMIN ADULT PO Take 1 tablet by mouth.   omega-3 acid ethyl esters 1 g capsule Commonly known as: LOVAZA Take 2 capsules (2 g total) by mouth 2 (two) times daily.   traMADol 50 MG tablet Commonly known as: ULTRAM Take 1-2 tablets (50-100 mg total) by mouth every 12 (twelve) hours as needed (mild pain).   Turmeric 500 MG Caps Take 1 tablet by mouth.      All past medical history, surgical history, allergies, family history, immunizations andmedications were updated in the EMR today and reviewed under the history and medication portions of their EMR.     No results found for this or any previous visit (from the past 2160 hour(s)).  No results found.   ROS: 14 pt review of systems performed and negative (unless mentioned in an HPI)  Objective: BP 126/80   Pulse 64   Temp 98.2 F  (36.8 C) (Oral)   Ht 6\' 1"  (1.854 m)   SpO2 97%   BMI 30.87 kg/m  Gen: Afebrile. No acute distress.  HENT: AT. [place].  Eyes:Pupils Equal Round Reactive to light, Extraocular movements intact,  Conjunctiva without redness, discharge or icterus. CV: RRR Chest: CTAB, no wheeze or crackles Skin: no rashes, purpura or petechiae.  Neuro: Normal gait. PERLA. EOMi. Alert. Oriented x3- tremor present.  Psych: Normal affect, dress and demeanor. Normal speech. Normal thought content and judgment..    No exam data present  Assessment/plan: Henry Knight is a 70 y.o. male present for [facility]  Hypertriglyceridemia/Obesity (BMI 30-39.9) - BP WNL - continue  lovaza - diet and routine exercise.  -Continue to monitor with goal less than 130/80. - f/u 5.5 months.  Chronic prescription benzodiazepine use/Controlled substance agreement signed/Primary osteoarthritis involving multiple joints/Social anxiety disorder/ Essential tremor Stable.  # pills per90d: tramadol/#90 Ativan Last UDS date:collected today Pain contract signed (Y/N): Y, updated 07/2019 Date narcotic database last reviewed (include red flags):  11/20/20 F/u 5.5 months  Return in about 5 months (around 05/04/2021) for CPE (30 min), [facility] (30 min).  No orders of the defined types were placed in this encounter.  Meds ordered this encounter  Medications  . omega-3 acid ethyl esters (LOVAZA) 1 g capsule    Sig: Take 2 capsules (2 g total) by mouth 2 (two) times daily.    Dispense:  120 capsule    Refill:  3    Hypertriglyceridemia;  . LORazepam (ATIVAN) 1 MG tablet    Sig: Take 1 tablet (1 mg total) by mouth daily as needed for anxiety.    Dispense:  90 tablet    Refill:  1  . traMADol (ULTRAM) 50 MG tablet    Sig: Take 1-2 tablets (50-100 mg total) by mouth every 12 (twelve) hours as needed (mild pain).    Dispense:  90 tablet    Refill:  1   Referral Orders  No referral(s) requested today  Note is dictated utilizing  voice recognition software. Although note has been proof read prior to signing, occasional typographical errors still can be missed. If any questions arise, please do not hesitate to call for verification.  Electronically signed by: Howard Pouch, DO [place]

## 2020-11-20 NOTE — Patient Instructions (Signed)
Nice to see you today.  I have refilled your meds.  Next appt in late July for physical and chronic conditions. We will perform your labs at that appt. Fast if you can.

## 2020-12-23 ENCOUNTER — Telehealth: Payer: Self-pay | Admitting: Family Medicine

## 2020-12-23 NOTE — Telephone Encounter (Signed)
Attempted to schedule AWV. Unable to LVM.  Will try at later time.  

## 2021-04-28 ENCOUNTER — Telehealth: Payer: Self-pay | Admitting: Family Medicine

## 2021-04-28 NOTE — Telephone Encounter (Signed)
Left message for patient to call back and schedule Medicare Annual Wellness Visit (AWV).   Please offer to do virtually or by telephone.   Due for AWVI  Please schedule at anytime with Nurse Health Advisor.   

## 2021-05-27 ENCOUNTER — Encounter: Payer: Medicare Other | Admitting: Family Medicine

## 2021-06-18 ENCOUNTER — Ambulatory Visit (INDEPENDENT_AMBULATORY_CARE_PROVIDER_SITE_OTHER): Payer: Medicare Other | Admitting: Family Medicine

## 2021-06-18 ENCOUNTER — Encounter: Payer: Self-pay | Admitting: Family Medicine

## 2021-06-18 ENCOUNTER — Other Ambulatory Visit: Payer: Self-pay

## 2021-06-18 VITALS — BP 136/73 | HR 56 | Temp 98.1°F | Ht 73.23 in | Wt 235.2 lb

## 2021-06-18 DIAGNOSIS — Z79899 Other long term (current) drug therapy: Secondary | ICD-10-CM

## 2021-06-18 DIAGNOSIS — F4010 Social phobia, unspecified: Secondary | ICD-10-CM

## 2021-06-18 DIAGNOSIS — I10 Essential (primary) hypertension: Secondary | ICD-10-CM

## 2021-06-18 DIAGNOSIS — E669 Obesity, unspecified: Secondary | ICD-10-CM

## 2021-06-18 DIAGNOSIS — G250 Essential tremor: Secondary | ICD-10-CM | POA: Diagnosis not present

## 2021-06-18 DIAGNOSIS — Z23 Encounter for immunization: Secondary | ICD-10-CM | POA: Diagnosis not present

## 2021-06-18 DIAGNOSIS — Z131 Encounter for screening for diabetes mellitus: Secondary | ICD-10-CM

## 2021-06-18 DIAGNOSIS — Z0000 Encounter for general adult medical examination without abnormal findings: Secondary | ICD-10-CM | POA: Diagnosis not present

## 2021-06-18 DIAGNOSIS — E781 Pure hyperglyceridemia: Secondary | ICD-10-CM

## 2021-06-18 DIAGNOSIS — Z125 Encounter for screening for malignant neoplasm of prostate: Secondary | ICD-10-CM | POA: Diagnosis not present

## 2021-06-18 LAB — CBC WITH DIFFERENTIAL/PLATELET
Basophils Absolute: 0 10*3/uL (ref 0.0–0.1)
Basophils Relative: 0.9 % (ref 0.0–3.0)
Eosinophils Absolute: 0.1 10*3/uL (ref 0.0–0.7)
Eosinophils Relative: 1.9 % (ref 0.0–5.0)
HCT: 44.3 % (ref 39.0–52.0)
Hemoglobin: 15.2 g/dL (ref 13.0–17.0)
Lymphocytes Relative: 35.4 % (ref 12.0–46.0)
Lymphs Abs: 1.6 10*3/uL (ref 0.7–4.0)
MCHC: 34.3 g/dL (ref 30.0–36.0)
MCV: 96.1 fl (ref 78.0–100.0)
Monocytes Absolute: 0.5 10*3/uL (ref 0.1–1.0)
Monocytes Relative: 10.2 % (ref 3.0–12.0)
Neutro Abs: 2.4 10*3/uL (ref 1.4–7.7)
Neutrophils Relative %: 51.6 % (ref 43.0–77.0)
Platelets: 185 10*3/uL (ref 150.0–400.0)
RBC: 4.61 Mil/uL (ref 4.22–5.81)
RDW: 12.9 % (ref 11.5–15.5)
WBC: 4.6 10*3/uL (ref 4.0–10.5)

## 2021-06-18 LAB — LIPID PANEL
Cholesterol: 201 mg/dL — ABNORMAL HIGH (ref 0–200)
HDL: 32 mg/dL — ABNORMAL LOW (ref >39.00)
LDL Cholesterol: 132 mg/dL — ABNORMAL HIGH (ref 0–99)
NonHDL: 169.37
Total CHOL/HDL Ratio: 6
Triglycerides: 186 mg/dL — ABNORMAL HIGH (ref 0.0–149.0)
VLDL: 37.2 mg/dL (ref 0.0–40.0)

## 2021-06-18 LAB — COMPREHENSIVE METABOLIC PANEL
ALT: 18 U/L (ref 0–53)
AST: 19 U/L (ref 0–37)
Albumin: 4.3 g/dL (ref 3.5–5.2)
Alkaline Phosphatase: 69 U/L (ref 39–117)
BUN: 22 mg/dL (ref 6–23)
CO2: 24 mEq/L (ref 19–32)
Calcium: 9 mg/dL (ref 8.4–10.5)
Chloride: 104 mEq/L (ref 96–112)
Creatinine, Ser: 0.88 mg/dL (ref 0.40–1.50)
GFR: 86.51 mL/min (ref >60.00)
Glucose, Bld: 90 mg/dL (ref 70–99)
Potassium: 4.2 mEq/L (ref 3.5–5.1)
Sodium: 136 mEq/L (ref 135–145)
Total Bilirubin: 1.2 mg/dL (ref 0.2–1.2)
Total Protein: 6.8 g/dL (ref 6.0–8.3)

## 2021-06-18 LAB — HEMOGLOBIN A1C: Hgb A1c MFr Bld: 5.6 % (ref 4.6–6.5)

## 2021-06-18 LAB — TSH: TSH: 2.66 u[IU]/mL (ref 0.35–5.50)

## 2021-06-18 LAB — PSA: PSA: 0.92 ng/mL (ref 0.10–4.00)

## 2021-06-18 MED ORDER — ZOSTER VAC RECOMB ADJUVANTED 50 MCG/0.5ML IM SUSR: 0.5000 mL | Freq: Once | INTRAMUSCULAR | 1 refills | Status: AC

## 2021-06-18 MED ORDER — LORAZEPAM 1 MG PO TABS: 1.0000 mg | ORAL_TABLET | Freq: Every day | ORAL | 1 refills | Status: DC | PRN

## 2021-06-18 MED ORDER — TRAMADOL HCL 50 MG PO TABS: 50.0000 mg | ORAL_TABLET | Freq: Two times a day (BID) | ORAL | 1 refills | Status: DC | PRN

## 2021-06-18 MED ORDER — ZOSTER VAC RECOMB ADJUVANTED 50 MCG/0.5ML IM SUSR: 0.5000 mL | Freq: Once | INTRAMUSCULAR | 1 refills | Status: DC

## 2021-06-18 MED ORDER — OMEGA-3-ACID ETHYL ESTERS 1 G PO CAPS: 2.0000 g | ORAL_CAPSULE | Freq: Two times a day (BID) | ORAL | 3 refills | Status: DC

## 2021-06-18 NOTE — Patient Instructions (Signed)
Great to see you today.  I have refilled the medication(s) we provide.   If labs were collected, we will inform you of lab results once received either by echart message or telephone call.   - echart message- for normal results that have been seen by the patient already.   - telephone call: abnormal results or if patient has not viewed results in their echart.   Health Maintenance After Age 71 After age 71, you are at a higher risk for certain long-term diseases and infections as well as injuries from falls. Falls are a major cause of broken bones and head injuries in people who are older than age 71. Getting regular preventive care can help to keep you healthy and well. Preventive care includes getting regular testing and making lifestyle changes as recommended by your health care provider. Talk with your health care provider about: Which screenings and tests you should have. A screening is a test that checks for a disease when you have no symptoms. A diet and exercise plan that is right for you. What should I know about screenings and tests to prevent falls? Screening and testing are the best ways to find a health problem early. Early diagnosis and treatment give you the best chance of managing medical conditions that are common after age 71. Certain conditions and lifestyle choices may make you more likely to have a fall. Your health care provider may recommend: Regular vision checks. Poor vision and conditions such as cataracts can make you more likely to have a fall. If you wear glasses, make sure to get your prescription updated if your vision changes. Medicine review. Work with your health care provider to regularly review all of the medicines you are taking, including over-the-counter medicines. Ask your health care provider about any side effects that may make you more likely to have a fall. Tell your health care provider if any medicines that you take make you feel dizzy or  sleepy. Osteoporosis screening. Osteoporosis is a condition that causes the bones to get weaker. This can make the bones weak and cause them to break more easily. Blood pressure screening. Blood pressure changes and medicines to control blood pressure can make you feel dizzy. Strength and balance checks. Your health care provider may recommend certain tests to check your strength and balance while standing, walking, or changing positions. Foot health exam. Foot pain and numbness, as well as not wearing proper footwear, can make you more likely to have a fall. Depression screening. You may be more likely to have a fall if you have a fear of falling, feel emotionally low, or feel unable to do activities that you used to do. Alcohol use screening. Using too much alcohol can affect your balance and may make you more likely to have a fall. What actions can I take to lower my risk of falls? General instructions Talk with your health care provider about your risks for falling. Tell your health care provider if: You fall. Be sure to tell your health care provider about all falls, even ones that seem minor. You feel dizzy, sleepy, or off-balance. Take over-the-counter and prescription medicines only as told by your health care provider. These include any supplements. Eat a healthy diet and maintain a healthy weight. A healthy diet includes low-fat dairy products, low-fat (lean) meats, and fiber from whole grains, beans, and lots of fruits and vegetables. Home safety Remove any tripping hazards, such as rugs, cords, and clutter. Install safety equipment such as   grab bars in bathrooms and safety rails on stairs. Keep rooms and walkways well-lit. Activity  Follow a regular exercise program to stay fit. This will help you maintain your balance. Ask your health care provider what types of exercise are appropriate for you. If you need a cane or walker, use it as recommended by your health care provider. Wear  supportive shoes that have nonskid soles. Lifestyle Do not drink alcohol if your health care provider tells you not to drink. If you drink alcohol, limit how much you have: 0-1 drink a day for women. 0-2 drinks a day for men. Be aware of how much alcohol is in your drink. In the [place] one drink equals one typical bottle of beer (12 oz), one-half glass of wine (5 oz), or one shot of hard liquor (1 oz). Do not use any products that contain nicotine or tobacco, such as cigarettes and e-cigarettes. If you need help quitting, ask your health care provider. Summary Having a healthy lifestyle and getting preventive care can help to protect your health and wellness after age 71. Screening and testing are the best way to find a health problem early and help you avoid having a fall. Early diagnosis and treatment give you the best chance for managing medical conditions that are more common for people who are older than age 71. Falls are a major cause of broken bones and head injuries in people who are older than age 71. Take precautions to prevent a fall at home. Work with your health care provider to learn what changes you can make to improve your health and wellness and to prevent falls. This information is not intended to replace advice given to you by your health care provider. Make sure you discuss any questions you have with your health care provider. Document Revised: 11/28/2020 Document Reviewed: 09/05/2020 Elsevier Patient Education  2022 [employer]  

## 2021-06-18 NOTE — Progress Notes (Signed)
This visit occurred during the SARS-CoV-2 public health emergency.  Safety protocols were in place, including screening questions prior to the visit, additional usage of staff PPE, and extensive cleaning of exam room while observing appropriate contact time as indicated for disinfecting solutions.    Patient ID: Henry Knight, male  DOB: Mar 11, 1951, 71 y.o.   MRN: [id number] Patient Care Team    Relationship Specialty Notifications Start End  Ma Hillock, DO PCP - General Family Medicine  07/11/15   Carol Ada, MD Consulting Physician Gastroenterology  08/11/16   Gaynelle Arabian, MD Consulting Physician Orthopedic Surgery  08/11/16   Melida Quitter, MD Consulting Physician Otolaryngology  08/11/16   Sciences, [facility] (Inactive)    10/13/16    Comment: plastic and reconstructiion [facility] (on [name] in [place])  Lavonna Monarch, MD Consulting Physician Dermatology  06/30/17     Chief Complaint  Patient presents with   Annual Exam    Pt is fasting    Subjective:  Henry Knight is a 70 y.o. male present for [place]. All past medical history, surgical history, allergies, family history, immunizations, medications and social history were updated in the electronic medical record today. All recent labs, ED visits and hospitalizations within the last year were reviewed.  Health maintenance:  Colonoscopy: last screen 11/2014, recommend follow up 5 -7 years (per pt); resulted 1 polyp. Completed by Dr. Benson Norway. Due 2023. Immunizations:  tdap UTD 2015, influenza admin UTD-today, PNA series completed- today, Shingrix printed for him again today. Covid x2- counseled booster.  Infectious disease screening:  Hep C completed PSA:  Lab Results  Component Value Date   PSA 0.97 01/18/2020   PSA 1.39 11/16/2018   PSA 0.82 08/11/2016  , pt was counseled on prostate cancer screenings.  Assistive device: none Oxygen [id number] Patient has a Dental home. Hospitalizations/ED  visits: reviewed  HLD/obesity:  Refused statin.  Started fenofibrate and then refused. He has refused many blood pressure medicines in the past as well. He is taking  his fish oil.   Arthritis/chronic pain: pt reports compliance with tramadol. He rarely uses now. as needed for pain- taking about 3-4x/mo.    Indication for chronic opioid: osteoarthiritis Medication and dose: Tramadol 50-100 Mg QD PRN for pain /Ativan 1 mg daily for tremor/anxiety (about 15-20 times a month-sometimes 2 times a day but rarely).  # pills per 90d: #30 tramadol/#180 Ativan#90 Last UDS date:next appt Pain contract signed (Y/N): Y Date narcotic database last reviewed (include red flags):  11/20/20   Essential tremor/anxiety: Pt has h/o essential tremor and is prescribed ativan 1 mg QD PRN. He has used the medication for years and reports no side effects from medicine.    Patient reports the Ativan works well enough.  He had looked into other types of therapy with brain stimulators etc. and decided he would not like to go down the road and would stay on Ativan.  He reports he takes the Ativan as needed with his tremor is more active or when he has social engagements etc.  Depression screen [facility] 2/9 06/18/2021 11/20/2020 11/20/2020 01/18/2020 07/18/2019  Decreased Interest 0 1 0 0 1  Down, Depressed, Hopeless 0 0 0 0 0  PHQ - 2 Score 0 1 0 0 1  Altered sleeping 1 1 - - -  Tired, decreased energy 1 1 - - -  Change in appetite 0 0 - - -  Feeling bad or failure about yourself  0 1 - - -  Trouble concentrating 0 0 - - -  Moving slowly or fidgety/restless 0 0 - - -  Suicidal thoughts 0 0 - - -  PHQ-9 Score 2 4 - - -  Difficult doing work/chores - Not difficult at all - - -   GAD 7 : Generalized Anxiety Score 11/20/2020 01/18/2020 07/18/2019 11/16/2018  Nervous, Anxious, on Edge 1 0 1 1  Control/stop worrying 1 0 0 0  Worry too much - different things 1 0 0 0  Trouble relaxing '1 3 1 1  '$ Restless '1 3 1 1  '$ Easily annoyed  or irritable 1 0 1 0  Afraid - awful might happen 0 0 0 0  Total GAD 7 Score '6 6 4 3  '$ Anxiety Difficulty Not difficult at all Not difficult at all Not difficult at all Not difficult at all       Fall Risk  06/18/2021 06/18/2021 11/20/2020 01/18/2020 03/22/2018  Falls in the past year? 1 0 0 1 No  Comment rolled off the couch and hit his head - - - -  Number falls in past yr: 0 0 0 0 -  Injury with Fall? 0 0 0 0 -  Comment rolled off the couch and hit his head - - - -  Risk for fall due to : - - - - -  Follow up - - Falls evaluation completed Education provided;Falls evaluation completed;Falls prevention discussed -    Immunization History  Administered Date(s) Administered   Fluad Quad(high Dose 65+) 07/18/2019, 06/18/2021   Influenza, High Dose Seasonal PF 08/11/2016, 07/29/2017, 07/19/2018   Influenza,inj,Quad PF,6+ Mos 07/11/2013, 08/07/2014, 07/11/2015   Influenza-Unspecified 07/27/2017, 08/02/2020   PFIZER Comirnaty(Gray Top)Covid-19 Tri-Sucrose Vaccine 02/16/2021   PFIZER(Purple Top)SARS-COV-2 Vaccination 10/25/2019, 11/15/2019, 07/03/2020   PNEUMOCOCCAL CONJUGATE-20 06/18/2021   Pneumococcal Conjugate-13 08/11/2016, 11/16/2018   Td 03/27/2020   Tdap 06/26/2014    Past Medical History:  Diagnosis Date   Arthritis    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 Benson Norway)   HTN (hypertension)    Melanoma ([place]) [id number]   MM 2 CHEST TX EXC   Neuromuscular disorder ([place])    SCC (squamous cell carcinoma) 05/15/2007   SCCA RIGHT FOREARM TX WITH BX   SCC (squamous cell carcinoma) 07/04/2013   WELL DIFF SCC LEFT FOREARM CX3 5FU   SCC (squamous cell carcinoma) 06/26/2014   SCC IN SITU TOP OF SCALP TX CX3 5FU   SCC (squamous cell carcinoma) 07/27/2017   SCC IN SITU RIGHT  SHOULDER TX CX3 5FU   Squamous cell carcinoma of skin 11/10/2004   SCC IN SITU RIGHT FOREARM CX3 5FU   Allergies  Allergen Reactions   Penicillins Rash and Other (See Comments)    Has patient had a PCN reaction causing immediate rash, facial/tongue/throat swelling, SOB or lightheadedness with hypotension: Unknown Has patient had a PCN reaction causing severe rash involving mucus membranes or skin necrosis: No Has patient had a PCN reaction that required hospitalization: No Has patient had a PCN reaction occurring within the last 10 years: No If all of the above answers are "NO", then may proceed with Cephalosporin use.    Past Surgical History:  Procedure Laterality Date   COLONOSCOPY W/ POLYPECTOMY  approx 2007; 11/2014  Dr. Marcy Panning 5 yrs    COSMETIC SURGERY     laser peels, [facility] Plastic and reconstructive surgery Clearence Cheek) for acne scars   KNEE SURGERY Right 2012   SHOULDER ARTHROSCOPY W/ ROTATOR CUFF REPAIR  2004   TOTAL KNEE ARTHROPLASTY Left 10/10/2017   Procedure: LEFT TOTAL KNEE ARTHROPLASTY;  Surgeon: Gaynelle Arabian, MD;  Location: [facility];  Service: Orthopedics;  Laterality: Left;  50 mins   Family History  Problem Relation Age of Onset   Heart disease Father    Alcohol abuse Father    Obesity Brother    Asperger's syndrome Brother    Obesity Mother    Tremor Mother    Anxiety disorder Mother    Social History   Social History Narrative   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 He lives with his wife & daughter.    Allergies as of 06/18/2021       Reactions   Penicillins Rash, Other (See Comments)   Has patient had a PCN reaction causing immediate rash, facial/tongue/throat swelling, SOB or lightheadedness with hypotension: Unknown Has patient had a PCN reaction causing severe rash involving mucus membranes or skin necrosis: No Has patient had a PCN reaction that required hospitalization: No Has patient had a PCN reaction occurring within the last 10  years: No If all of the above answers are "NO", then may proceed with Cephalosporin use.        Medication List        Accurate as of June 18, 2021  2:34 PM. If you have any questions, ask your nurse or doctor.          STOP taking these medications    MULTIVITAMIN ADULT PO Stopped by: Howard Pouch, DO       TAKE these medications    LORazepam 1 MG tablet Commonly known as: ATIVAN Take 1 tablet (1 mg total) by mouth daily as needed for anxiety.   omega-3 acid ethyl esters 1 g capsule Commonly known as: LOVAZA Take 2 capsules (2 g total) by mouth 2 (two) times daily.   traMADol 50 MG tablet Commonly known as: ULTRAM Take 1-2 tablets (50-100 mg total) by mouth every 12 (twelve) hours as needed (mild pain).   Turmeric 500 MG Caps Take 1 tablet by mouth.   Zoster Vaccine Adjuvanted injection Commonly known as: SHINGRIX Inject 0.5 mLs into the muscle once for 1 dose. Started by: Howard Pouch, DO       All past medical history, surgical history, allergies, family history, immunizations andmedications were updated in the EMR today and reviewed under the history and medication portions of their EMR.     No results found for this or any previous visit (from the past 2160 hour(s)).  No results found.   ROS: 14 pt review of systems performed and negative (unless mentioned in an HPI)  Objective: BP 136/73   Pulse (!) 56   Temp 98.1 F (36.7 C)   Ht 6' 1.23" (1.86 m)   Wt 235 lb 3.2 oz (106.7 kg)   SpO2 96%   BMI 30.84 kg/m  Gen: Afebrile. No acute distress. Nontoxic in appearance, well-developed, well-nourished, very pleasant, obese male HENT: AT. [place]. Bilateral TM visualized and normal in appearance, normal external auditory canal. MMM, no oral lesions, adequate dentition. Bilateral nares within normal limits. Throat without erythema, ulcerations or exudates.  No cough on exam, no hoarseness on exam. Eyes:Pupils Equal Round Reactive to light, Extraocular  movements intact,  Conjunctiva  without redness, discharge or icterus. Neck/lymp/endocrine: Supple, no lymphadenopathy, no thyromegaly CV: RRR no murmur, no edema, +2/4 P posterior tibialis pulses.  Chest: CTAB, no wheeze, rhonchi or crackles.  Normal respiratory effort.  Good air movement. Abd: Soft.  Flat. NTND. BS present.  No masses palpated. No hepatosplenomegaly. No rebound tenderness or guarding. Skin: No rashes, purpura or petechiae. Warm and well-perfused. Skin intact. Neuro/Msk:  Normal gait. PERLA. EOMi. Alert. Oriented x3.  Cranial nerves II through XII intact. Muscle strength 5/5 upper/lower extremity. DTRs equal bilaterally. Psych: Normal affect, dress and demeanor. Normal speech. Normal thought content and judgment.  No results found.  Assessment/plan: Henry Knight is a 70 y.o. male present for CPE/CMC HTN/Hypertriglyceridemia/Obesity (BMI 30-39.9) -Stable - continue  lovaza -CBC, CMP, TSH and lipids collected today - diet and routine exercise.  -Continue to monitor with goal less than 130/80. - f/u 5.5 months.   Chronic prescription benzodiazepine use/Controlled substance agreement signed/Primary osteoarthritis involving multiple joints/Social anxiety disorder/ Essential tremor Stable Continue tramadol as needed Continue Ativan as needed # pills per 90d: tramadol/#90 Ativan Last UDS date: collected today Pain contract signed (Y/N): Y, Date narcotic database last reviewed (include red flags):  06/18/21 F/u 5.5 months  Need for influenza vaccination - Flu Vaccine QUAD High Dose(Fluad) Prostate cancer screening - PSA Diabetes mellitus screening - Hemoglobin A1c Vaccine for streptococcus pneumoniae and influenza - Flu Vaccine QUAD High Dose(Fluad) - Pneumococcal conjugate vaccine 20-valent (Prevnar 20)  Routine general medical examination at a health care facility Colonoscopy: last screen 11/2014, recommend follow up 5 -7 years (per pt); resulted 1 polyp.  Completed by Dr. Benson Norway. Due 2023. Immunizations:  tdap UTD 2015, influenza admin UTD-today, PNA series completed- today, Shingrix printed for him again today. Covid x2- counseled booster.  Infectious disease screening:  Hep C completed Patient was encouraged to exercise greater than 150 minutes a week. Patient was encouraged to choose a diet filled with fresh fruits and vegetables, and lean meats. AVS provided to patient today for education/recommendation on gender specific health and safety maintenance. Return in about 5 months (around 12/02/2021) for [place] (30 min).  Orders Placed This Encounter  Procedures   Flu Vaccine QUAD High Dose(Fluad)   Pneumococcal conjugate vaccine 20-valent (Prevnar 20)   CBC with Differential/Platelet   Lipid panel   Hemoglobin A1c   Comprehensive metabolic panel   TSH   PSA   Drug Monitoring Panel [phone] , Urine      Meds ordered this encounter  Medications   omega-3 acid ethyl esters (LOVAZA) 1 g capsule    Sig: Take 2 capsules (2 g total) by mouth 2 (two) times daily.    Dispense:  120 capsule    Refill:  3    Hypertriglyceridemia;   DISCONTD: Zoster Vaccine Adjuvanted (SHINGRIX) injection    Sig: Inject 0.5 mLs into the muscle once for 1 dose.    Dispense:  0.5 mL    Refill:  1   Zoster Vaccine Adjuvanted [facility]) injection    Sig: Inject 0.5 mLs into the muscle once for 1 dose.    Dispense:  0.5 mL    Refill:  1   traMADol (ULTRAM) 50 MG tablet    Sig: Take 1-2 tablets (50-100 mg total) by mouth every 12 (twelve) hours as needed (mild pain).    Dispense:  90 tablet    Refill:  1   LORazepam (ATIVAN) 1 MG tablet    Sig: Take 1 tablet (1 mg total) by mouth  daily as needed for anxiety.    Dispense:  90 tablet    Refill:  1    Referral Orders  No referral(s) requested today     Note is dictated utilizing voice recognition software. Although note has been proof read prior to signing, occasional typographical errors still can be missed.  If any questions arise, please do not hesitate to call for verification.  Electronically signed by: Howard Pouch, DO [place]

## 2021-06-20 LAB — DM TEMPLATE

## 2021-06-20 LAB — DRUG MONITORING PANEL 376104, URINE
Amphetamines: NEGATIVE ng/mL (ref <500)
Barbiturates: NEGATIVE ng/mL (ref <300)
Benzodiazepines: NEGATIVE ng/mL (ref <100)
Cocaine Metabolite: NEGATIVE ng/mL (ref <150)
Desmethyltramadol: NEGATIVE ng/mL (ref <100)
Opiates: NEGATIVE ng/mL (ref <100)
Oxycodone: NEGATIVE ng/mL (ref <100)
Tramadol: NEGATIVE ng/mL (ref <100)

## 2021-08-06 ENCOUNTER — Ambulatory Visit: Payer: Medicare Other | Admitting: Dermatology

## 2021-08-06 ENCOUNTER — Encounter: Payer: Self-pay | Admitting: Dermatology

## 2021-08-06 ENCOUNTER — Other Ambulatory Visit: Payer: Self-pay

## 2021-08-06 DIAGNOSIS — D0462 Carcinoma in situ of skin of left upper limb, including shoulder: Secondary | ICD-10-CM

## 2021-08-06 DIAGNOSIS — Z1283 Encounter for screening for malignant neoplasm of skin: Secondary | ICD-10-CM

## 2021-08-06 DIAGNOSIS — L570 Actinic keratosis: Secondary | ICD-10-CM | POA: Diagnosis not present

## 2021-08-06 DIAGNOSIS — Z85820 Personal history of malignant melanoma of skin: Secondary | ICD-10-CM | POA: Diagnosis not present

## 2021-08-06 DIAGNOSIS — D485 Neoplasm of uncertain behavior of skin: Secondary | ICD-10-CM

## 2021-08-06 NOTE — Patient Instructions (Signed)

## 2021-08-27 ENCOUNTER — Encounter: Payer: Self-pay | Admitting: Dermatology

## 2021-09-02 NOTE — Progress Notes (Signed)
   Follow-Up Visit   Subjective  Henry Knight is a 70 y.o. male who presents for the following: Annual Exam (Scalp lesions that feel crusty history of melanoma. ).  General skin examination, check spots on the left arm and hands. Location:  Duration:  Quality:  Associated Signs/Symptoms: Modifying Factors:  Severity:  Timing: Context:   Objective  Well appearing patient in no apparent distress; mood and affect are within normal limits. Mid Back Full body skin exam.  No atypical pigmented lesions.  No recurrences.  1 possible new nonmelanoma skin cancer left shoulder will be biopsied and treated.  Left Hand - Posterior, Right Hand - Posterior hornlike Pink 3 to 4 mm crusts  Left Shoulder - Posterior 8 mm waxy pink crust compatible with superficial carcinoma         All skin waist up examined.   Assessment & Plan    Screening for malignant neoplasm of skin Mid Back  Yearly skin exam  AK (actinic keratosis) (2) Left Hand - Posterior; Right Hand - Posterior  Destruction of lesion - Left Hand - Posterior, Right Hand - Posterior Complexity: simple   Destruction method: cryotherapy   Informed consent: discussed and consent obtained   Timeout:  patient name, date of birth, surgical site, and procedure verified Lesion destroyed using liquid nitrogen: Yes   Cryotherapy cycles:  3 Outcome: patient tolerated procedure well with no complications   Post-procedure details: wound care instructions given    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 Left Shoulder - Posterior  Skin / nail biopsy Type of biopsy: tangential   Informed consent: discussed and consent obtained   Timeout: patient name, date of birth, surgical site, and procedure verified   Anesthesia: the lesion was anesthetized in a standard fashion   Anesthetic:  1% lidocaine w/ epinephrine 1-100,000 local infiltration Instrument used: flexible razor blade   Hemostasis  achieved with: ferric subsulfate   Outcome: patient tolerated procedure well   Post-procedure details: wound care instructions given    Destruction of lesion Complexity: simple   Destruction method: electrodesiccation and curettage   Informed consent: discussed and consent obtained   Timeout:  patient name, date of birth, surgical site, and procedure verified Anesthesia: the lesion was anesthetized in a standard fashion   Anesthetic:  1% lidocaine w/ epinephrine 1-100,000 local infiltration Curettage performed in three different directions: Yes   Curettage cycles:  3 Lesion length (cm):  0.9 Lesion width (cm):  0.9 Margin per side (cm):  0 Final wound size (cm):  0.9 Hemostasis achieved with:  aluminum chloride Outcome: patient tolerated procedure well with no complications   Post-procedure details: wound care instructions given    Specimen 1 - Surgical pathology Differential Diagnosis: scc vs bcc txpbx Check Margins: No  After shave biopsy the base of the lesion was treated with curettage plus cautery.      I, Lavonna Monarch, MD, have reviewed all documentation for this visit.  The documentation on 09/02/21 for the exam, diagnosis, procedures, and orders are all accurate and complete.

## 2021-12-25 ENCOUNTER — Telehealth: Payer: Self-pay

## 2021-12-25 NOTE — Telephone Encounter (Signed)
Called pt to schedule AWV. Please schedule with health coach, Toria or Bethanie Bloxom. ? ?

## 2022-01-27 ENCOUNTER — Ambulatory Visit (INDEPENDENT_AMBULATORY_CARE_PROVIDER_SITE_OTHER): Payer: Medicare Other

## 2022-01-27 DIAGNOSIS — Z0000 Encounter for general adult medical examination without abnormal findings: Secondary | ICD-10-CM

## 2022-01-27 DIAGNOSIS — Z86010 Personal history of colonic polyps: Secondary | ICD-10-CM | POA: Diagnosis not present

## 2022-01-27 DIAGNOSIS — Z1211 Encounter for screening for malignant neoplasm of colon: Secondary | ICD-10-CM | POA: Diagnosis not present

## 2022-01-27 NOTE — Patient Instructions (Signed)
Henry Knight , ?Thank you for taking time to come for your Medicare Wellness Visit. I appreciate your ongoing commitment to your health goals. Please review the following plan we discussed and let me know if I can assist you in the future.  ? ?Screening recommendations/referrals: ?Colonoscopy: Order placed 01/27/22 ?Recommended yearly ophthalmology/optometry visit for glaucoma screening and checkup ?Recommended yearly dental visit for hygiene and checkup ? ?Vaccinations: ?Influenza vaccine: Done 06/18/21 repeat every year  ?Pneumococcal vaccine: Up to date ?Tdap vaccine: Done 03/27/20 repeat every 10 years  ?Shingles vaccine: Shingrix discussed. Please contact your pharmacy for coverage information.    ?Covid-19: Completed 1/21, 2/11, 07/03/20 & 5/16, 07/24/21 ? ?Advanced directives: Please bring a copy of your health care power of attorney and living will to the office at your convenience. ? ?Conditions/risks identified: None at this time  ? ?Next appointment: Follow up in one year for your annual wellness visit.  ? ?Preventive Care 72 Years and Older, Male ?Preventive care refers to lifestyle choices and visits with your health care provider that can promote health and wellness. ?What does preventive care include? ?A yearly physical exam. This is also called an annual well check. ?Dental exams once or twice a year. ?Routine eye exams. Ask your health care provider how often you should have your eyes checked. ?Personal lifestyle choices, including: ?Daily care of your teeth and gums. ?Regular physical activity. ?Eating a healthy diet. ?Avoiding tobacco and drug use. ?Limiting alcohol use. ?Practicing safe sex. ?Taking low doses of aspirin every day. ?Taking vitamin and mineral supplements as recommended by your health care provider. ?What happens during an annual well check? ?The services and screenings done by your health care provider during your annual well check will depend on your age, overall health, lifestyle  risk factors, and family history of disease. ?Counseling  ?Your health care provider may ask you questions about your: ?Alcohol use. ?Tobacco use. ?Drug use. ?Emotional well-being. ?Home and relationship well-being. ?Sexual activity. ?Eating habits. ?History of falls. ?Memory and ability to understand (cognition). ?Work and work Statistician. ?Screening  ?You may have the following tests or measurements: ?Height, weight, and BMI. ?Blood pressure. ?Lipid and cholesterol levels. These may be checked every 5 years, or more frequently if you are over 42 years old. ?Skin check. ?Lung cancer screening. You may have this screening every year starting at age 5 if you have a 30-pack-year history of smoking and currently smoke or have quit within the past 15 years. ?Fecal occult blood test (FOBT) of the stool. You may have this test every year starting at age 27. ?Flexible sigmoidoscopy or colonoscopy. You may have a sigmoidoscopy every 5 years or a colonoscopy every 10 years starting at age 72. ?Prostate cancer screening. Recommendations will vary depending on your family history and other risks. ?Hepatitis C blood test. ?Hepatitis B blood test. ?Sexually transmitted disease (STD) testing. ?Diabetes screening. This is done by checking your blood sugar (glucose) after you have not eaten for a while (fasting). You may have this done every 1-3 years. ?Abdominal aortic aneurysm (AAA) screening. You may need this if you are a current or former smoker. ?Osteoporosis. You may be screened starting at age 18 if you are at high risk. ?Talk with your health care provider about your test results, treatment options, and if necessary, the need for more tests. ?Vaccines  ?Your health care provider may recommend certain vaccines, such as: ?Influenza vaccine. This is recommended every year. ?Tetanus, diphtheria, and acellular pertussis (Tdap, Td) vaccine.  You may need a Td booster every 10 years. ?Zoster vaccine. You may need this after age  72. ?Pneumococcal 13-valent conjugate (PCV13) vaccine. One dose is recommended after age 32. ?Pneumococcal polysaccharide (PPSV23) vaccine. One dose is recommended after age 72. ?Talk to your health care provider about which screenings and vaccines you need and how often you need them. ?This information is not intended to replace advice given to you by your health care provider. Make sure you discuss any questions you have with your health care provider. ?Document Released: 10/17/2015 Document Revised: 06/09/2016 Document Reviewed: 07/22/2015 ?Elsevier Interactive Patient Education ? 2017 [place]. ? ?Fall Prevention in the Home ?Falls can cause injuries. They can happen to people of all ages. There are many things you can do to make your home safe and to help prevent falls. ?What can I do on the outside of my home? ?Regularly fix the edges of walkways and driveways and fix any cracks. ?Remove anything that might make you trip as you walk through a door, such as a raised step or threshold. ?Trim any bushes or trees on the path to your home. ?Use bright outdoor lighting. ?Clear any walking paths of anything that might make someone trip, such as rocks or tools. ?Regularly check to see if handrails are loose or broken. Make sure that both sides of any steps have handrails. ?Any raised decks and porches should have guardrails on the edges. ?Have any leaves, snow, or ice cleared regularly. ?Use sand or salt on walking paths during winter. ?Clean up any spills in your garage right away. This includes oil or grease spills. ?What can I do in the bathroom? ?Use night lights. ?Install grab bars by the toilet and in the tub and shower. Do not use towel bars as grab bars. ?Use non-skid mats or decals in the tub or shower. ?If you need to sit down in the shower, use a plastic, non-slip stool. ?Keep the floor dry. Clean up any water that spills on the floor as soon as it happens. ?Remove soap buildup in the tub or shower  regularly. ?Attach bath mats securely with double-sided non-slip rug tape. ?Do not have throw rugs and other things on the floor that can make you trip. ?What can I do in the bedroom? ?Use night lights. ?Make sure that you have a light by your bed that is easy to reach. ?Do not use any sheets or blankets that are too big for your bed. They should not hang down onto the floor. ?Have a firm chair that has side arms. You can use this for support while you get dressed. ?Do not have throw rugs and other things on the floor that can make you trip. ?What can I do in the kitchen? ?Clean up any spills right away. ?Avoid walking on wet floors. ?Keep items that you use a lot in easy-to-reach places. ?If you need to reach something above you, use a strong step stool that has a grab bar. ?Keep electrical cords out of the way. ?Do not use floor polish or wax that makes floors slippery. If you must use wax, use non-skid floor wax. ?Do not have throw rugs and other things on the floor that can make you trip. ?What can I do with my stairs? ?Do not leave any items on the stairs. ?Make sure that there are handrails on both sides of the stairs and use them. Fix handrails that are broken or loose. Make sure that handrails are as long as  the stairways. ?Check any carpeting to make sure that it is firmly attached to the stairs. Fix any carpet that is loose or worn. ?Avoid having throw rugs at the top or bottom of the stairs. If you do have throw rugs, attach them to the floor with carpet tape. ?Make sure that you have a light switch at the top of the stairs and the bottom of the stairs. If you do not have them, ask someone to add them for you. ?What else can I do to help prevent falls? ?Wear shoes that: ?Do not have high heels. ?Have rubber bottoms. ?Are comfortable and fit you well. ?Are closed at the toe. Do not wear sandals. ?If you use a stepladder: ?Make sure that it is fully opened. Do not climb a closed stepladder. ?Make sure that  both sides of the stepladder are locked into place. ?Ask someone to hold it for you, if possible. ?Clearly mark and make sure that you can see: ?Any grab bars or handrails. ?First and last steps. ?Where t

## 2022-01-27 NOTE — Progress Notes (Addendum)
Virtual Visit via Telephone Note ? ?I connected with  Henry Knight on 01/27/22 at  8:00 AM EDT by telephone and verified that I am speaking with the correct person using two identifiers. ? ?Medicare Annual Wellness visit completed telephonically due to Covid-19 pandemic.  ? ?Persons participating in this call: This Health Coach and this patient.  ? ?Location: ?Patient: home ?Provider: office ?  ?I discussed the limitations, risks, security and privacy concerns of performing an evaluation and management service by telephone and the availability of in person appointments. The patient expressed understanding and agreed to proceed. ? ?Unable to perform video visit due to video visit attempted and failed and/or patient does not have video capability.  ? ?Some vital signs may be absent or patient reported.  ? ?Willette Brace, LPN ? ? ?Subjective:  ? Henry Knight is a 71 y.o. male who presents for an Initial Medicare Annual Wellness Visit. ? ?Review of Systems    ? ?Cardiac Risk Factors include: advanced age (>64mn, >>70women);hypertension;male gender;obesity (BMI >30kg/m2) ? ?   ?Objective:  ?  ?There were no vitals filed for this visit. ?There is no height or weight on file to calculate BMI. ? ? ?  01/27/2022  ?  8:04 AM 10/10/2017  ? 11:23 AM 10/06/2017  ? 10:24 AM 11/04/2015  ? 11:40 AM 11/04/2015  ? 11:30 AM  ?Advanced Directives  ?Does Patient Have a Medical Advance Directive? Yes No No No No  ?Type of Advance Directive Living will      ?Would patient like information on creating a medical advance directive?  No - Patient declined No - Patient declined No - patient declined information No - patient declined information  ? ? ?Current Medications (verified) ?Outpatient Encounter Medications as of 01/27/2022  ?Medication Sig  ? LORazepam (ATIVAN) 1 MG tablet Take 1 tablet (1 mg total) by mouth daily as needed for anxiety.  ? omega-3 acid ethyl esters (LOVAZA) 1 g capsule Take 2 capsules (2 g total) by mouth 2  (two) times daily.  ? traMADol (ULTRAM) 50 MG tablet Take 1-2 tablets (50-100 mg total) by mouth every 12 (twelve) hours as needed (mild pain).  ? Turmeric 500 MG CAPS Take 1 tablet by mouth.  ? ?No facility-administered encounter medications on file as of 01/27/2022.  ? ? ?Allergies (verified) ?Penicillins  ? ?History: ?Past Medical History:  ?Diagnosis Date  ? Arthritis   ? BCC (basal cell carcinoma of skin) 07/11/2019  ? BCC LOWER BACK CX3 5FU  ? Diverticulosis 11/2014  ? Noted on 11/2014 colonoscopy: ascending, descending, and sigmoid  ? Essential tremor   ? History of adenomatous polyp of colon 2007; 2016  ? Recall 5 yrs (Dr. HBenson [place]  ? HTN (hypertension)   ? Melanoma ([facility] [id number] ? MM 2 CHEST TX EXC  ? Neuromuscular disorder ([place]   ? SCC (squamous cell carcinoma) 05/15/2007  ? SCCA RIGHT FOREARM TX WITH BX  ? SCC (squamous cell carcinoma) 07/04/2013  ? WELL DIFF SCC LEFT FOREARM CX3 5FU  ? SCC (squamous cell carcinoma) 06/26/2014  ? SCC IN SITU TOP OF SCALP TX CX3 5FU  ? SCC (squamous cell carcinoma) 07/27/2017  ? SCC IN SITU RIGHT SHOULDER TX CX3 5FU  ? Squamous cell carcinoma of skin 11/10/2004  ? SCC IN SITU RIGHT FOREARM CX3 5FU  ? ?Past Surgical History:  ?Procedure Laterality Date  ? COLONOSCOPY W/ POLYPECTOMY  approx 2007; 11/2014  ? Dr. HMarcy Panning5 yrs   ?  COSMETIC SURGERY    ? laser peels, [facility]) for acne scars  ? KNEE SURGERY Right 2012  ? SHOULDER ARTHROSCOPY W/ ROTATOR CUFF REPAIR  2004  ? TOTAL KNEE ARTHROPLASTY Left 10/10/2017  ? Procedure: LEFT TOTAL KNEE ARTHROPLASTY;  Surgeon: Gaynelle Arabian, MD;  Location: [facility];  Service: Orthopedics;  Laterality: Left;  50 mins  ? ?Family History  ?Problem Relation Age of Onset  ? Heart disease Father   ? Alcohol abuse Father   ? Obesity Brother   ? Asperger's syndrome Brother   ? Obesity Mother   ? Tremor Mother   ? Anxiety disorder Mother   ? ?Social History  ? ?Socioeconomic History  ? Marital status:  Married  ?  Spouse name: Not on file  ? Number of children: 1  ? Years of education: Not on file  ? Highest education level: Not on file  ?Occupational History  ? Not on file  ?Tobacco Use  ? Smoking status: Never  ? Smokeless tobacco: Never  ?Vaping Use  ? Vaping Use: Never used  ?Substance and Sexual Activity  ? Alcohol use: Yes  ?  Alcohol/week: 8.0 standard drinks  ?  Types: 8 Glasses of wine per week  ?  Comment: Weekend  ? Drug use: No  ? Sexual activity: Yes  ?  Partners: Female  ?Other Topics Concern  ? Not on file  ?Social History Narrative  ? 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 He lives with his wife & daughter.  ? ?Social Determinants of Health  ? ?Financial Resource Strain: Low Risk   ? Difficulty of Paying Living Expenses: Not hard at all  ?Food Insecurity: No Food Insecurity  ? Worried About Charity fundraiser in the Last Year: Never true  ? Ran Out of Food in the Last Year: Never true  ?Transportation Needs: No Transportation Needs  ? Lack of Transportation (Medical): No  ? Lack of Transportation (Non-Medical): No  ?Physical Activity: Sufficiently Active  ? Days of Exercise per Week: 5 days  ? Minutes of Exercise per Session: 90 min  ?Stress: No Stress Concern Present  ? Feeling of Stress : Not at all  ?Social Connections: Moderately Integrated  ? Frequency of Communication with Friends and Family: More than three times a week  ? Frequency of Social Gatherings with Friends and Family: More than three times a week  ? Attends Religious Services: 1 to 4 times per year  ? Active Member of Clubs or Organizations: No  ? Attends Archivist Meetings: Never  ? Marital Status: Married  ? ? ?Tobacco Counseling ?Counseling given: Not Answered ? ? ?Clinical Intake: ? ?Pre-visit preparation completed: Yes ? ?Pain : No/denies pain ? ?  ? ?BMI - recorded: 30.84 ?Nutritional Status: BMI > 30  Obese ?Nutritional Risks: None ?Diabetes: No ? ?How often do you need to have someone help you when you read  instructions, pamphlets, or other written materials from your doctor or pharmacy?: 1 - Never ? ?Diabetic?no ? ?Interpreter Needed?: No ? ?Information entered by :: Charlott Rakes, LPN ? ? ?Activities of Daily Living ? ?  01/27/2022  ?  8:07 AM  ?In your present state of health, do you have any difficulty performing the following activities:  ?Hearing? 0  ?Vision? 0  ?Difficulty concentrating or making decisions? 0  ?Walking or climbing stairs? 0  ?Dressing or bathing? 0  ?Doing errands, shopping? 0  ?Preparing Food and eating ?  N  ?Using the Toilet? N  ?In the past six months, have you accidently leaked urine? N  ?Do you have problems with loss of bowel control? N  ?Managing your Medications? N  ?Managing your Finances? N  ? ? ?Patient Care Team: ?Ma Hillock, DO as PCP - General (Family Medicine) ?Carol Ada, MD as Consulting Physician (Gastroenterology) ?Gaynelle Arabian, MD as Consulting Physician (Orthopedic Surgery) ?Melida Quitter, MD as Consulting Physician (Otolaryngology) ?[facility] [place] (Inactive) ?Lavonna Monarch, MD as Consulting Physician (Dermatology) ? ?Indicate any recent Medical Services you may have received from other than [facility] providers in the past year (date may be approximate). ? ?   ?Assessment:  ? This is a routine wellness examination for Henry Knight. ? ?Hearing/Vision screen ?Hearing Screening - Comments:: Pt denies any hearing issues  ?Vision Screening - Comments:: Pt will follow up with provider in [place] for annual eye exams  ? ?Dietary issues and exercise activities discussed: ?Current Exercise Habits: Home exercise routine, Type of exercise: Other - see comments, Time (Minutes): > 60, Frequency (Times/Week): 5, Weekly Exercise (Minutes/Week): 0 ? ? Goals Addressed   ? ?  ?  ?  ?  ? This Visit's Progress  ?  Patient Stated     ?  None at this time  ?  ? ?  ? ?Depression Screen ? ?  01/27/2022  ?  8:02 AM 06/18/2021  ? 10:32 AM 11/20/2020  ? 10:38 AM 11/20/2020  ?  10:28 AM 01/18/2020  ?  9:18 AM 07/18/2019  ? 10:56 AM 11/16/2018  ? 10:42 AM  ?PHQ 2/9 Scores  ?PHQ - 2 Score 0 0 1 0 0 1 0  ?PHQ- 9 Score  '2 4    2  '$ ?  ?Fall Risk ? ?  01/27/2022  ?  8:05 AM 06/18/2021  ? 1

## 2022-03-04 ENCOUNTER — Encounter: Payer: Self-pay | Admitting: Family Medicine

## 2022-03-04 ENCOUNTER — Ambulatory Visit (INDEPENDENT_AMBULATORY_CARE_PROVIDER_SITE_OTHER): Payer: Medicare Other | Admitting: Family Medicine

## 2022-03-04 VITALS — BP 117/72 | HR 80 | Temp 98.3°F | Ht 73.25 in | Wt 236.0 lb

## 2022-03-04 DIAGNOSIS — R051 Acute cough: Secondary | ICD-10-CM | POA: Diagnosis not present

## 2022-03-04 DIAGNOSIS — M62838 Other muscle spasm: Secondary | ICD-10-CM | POA: Diagnosis not present

## 2022-03-04 DIAGNOSIS — J329 Chronic sinusitis, unspecified: Secondary | ICD-10-CM

## 2022-03-04 DIAGNOSIS — B9689 Other specified bacterial agents as the cause of diseases classified elsewhere: Secondary | ICD-10-CM

## 2022-03-04 LAB — POC COVID19 BINAXNOW: SARS Coronavirus 2 Ag: NEGATIVE

## 2022-03-04 MED ORDER — DOXYCYCLINE HYCLATE 100 MG PO TABS: 100.0000 mg | ORAL_TABLET | Freq: Two times a day (BID) | ORAL | 0 refills | Status: DC

## 2022-03-04 MED ORDER — METHYLPREDNISOLONE ACETATE 80 MG/ML IJ SUSP
80.0000 mg | Freq: Once | INTRAMUSCULAR | Status: AC
  Administered 2022-03-04: 80 mg via INTRAMUSCULAR

## 2022-03-04 MED ORDER — METHOCARBAMOL 500 MG PO TABS: 500.0000 mg | ORAL_TABLET | Freq: Two times a day (BID) | ORAL | 0 refills | Status: DC | PRN

## 2022-03-04 MED ORDER — FLUTICASONE PROPIONATE 50 MCG/ACT NA SUSP: 2.0000 | Freq: Every day | NASAL | 0 refills | Status: DC

## 2022-03-04 NOTE — Progress Notes (Signed)
Henry Knight , 1951-05-01, 71 y.o., male MRN: [id number] Patient Care Team    Relationship Specialty Notifications Start End  Ma Hillock, DO PCP - General Family Medicine  07/11/15   Carol Ada, MD Consulting Physician Gastroenterology  08/11/16   Gaynelle Arabian, MD Consulting Physician Orthopedic Surgery  08/11/16   Melida Quitter, MD Consulting Physician Otolaryngology  08/11/16   Sciences, [facility] (Inactive)    10/13/16    Comment: plastic and reconstructiion [facility] (on [name] in [place])  Lavonna Monarch, MD Consulting Physician Dermatology  06/30/17     Chief Complaint  Patient presents with   Cough    Pt c/o cough, HA, CP, nasal drainage, post nasal drip x 3 days     Subjective: Pt presents for an OV with complaints of headache, chest discomfort, wheezing, nasal drainage that has been worsening over the last 3 days.  His wife was treated for a bacterial sinus infection last week.  He has been taking Robitussin, ibuprofen/Tylenol to help with his symptoms.  He feels the symptoms are worsening instead of improving.      01/27/2022    8:02 AM 06/18/2021   10:32 AM 11/20/2020   10:38 AM 11/20/2020   10:28 AM 01/18/2020    9:18 AM  Depression screen PHQ 2/9  Decreased Interest 0 0 1 0 0  Down, Depressed, Hopeless 0 0 0 0 0  PHQ - 2 Score 0 0 1 0 0  Altered sleeping  1 1    Tired, decreased energy  1 1    Change in appetite  0 0    Feeling bad or failure about yourself   0 1    Trouble concentrating  0 0    Moving slowly or fidgety/restless  0 0    Suicidal thoughts  0 0    PHQ-9 Score  2 4    Difficult doing work/chores   Not difficult at all      Allergies  Allergen Reactions   Penicillins Rash and Other (See Comments)    Has patient had a PCN reaction causing immediate rash, facial/tongue/throat swelling, SOB or lightheadedness with hypotension: Unknown Has patient had a PCN reaction causing severe rash involving mucus membranes or skin  necrosis: No Has patient had a PCN reaction that required hospitalization: No Has patient had a PCN reaction occurring within the last 10 years: No If all of the above answers are "NO", then may proceed with Cephalosporin use.    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 He lives with his wife & daughter.   Past Medical History:  Diagnosis Date   Arthritis    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 Benson Norway)   HTN (hypertension)    Melanoma ([place]) [id number]   MM 2 CHEST TX EXC   Neuromuscular disorder ([place])    SCC (squamous cell carcinoma) 05/15/2007   SCCA RIGHT FOREARM TX WITH BX   SCC (squamous cell carcinoma) 07/04/2013   WELL DIFF SCC LEFT FOREARM CX3 5FU   SCC (squamous cell carcinoma) 06/26/2014   SCC IN SITU TOP OF SCALP TX CX3 5FU   SCC (squamous cell carcinoma)  07/27/2017   SCC IN SITU RIGHT SHOULDER TX CX3 5FU   Squamous cell carcinoma of skin 11/10/2004   SCC IN SITU RIGHT FOREARM CX3 5FU   Past Surgical History:  Procedure Laterality Date   COLONOSCOPY W/ POLYPECTOMY  approx 2007; 11/2014   Dr. Marcy Panning 5 yrs    COSMETIC SURGERY     laser peels, [facility] Plastic and reconstructive surgery Clearence Cheek) for acne scars   KNEE SURGERY Right 2012   SHOULDER ARTHROSCOPY W/ ROTATOR CUFF REPAIR  2004   TOTAL KNEE ARTHROPLASTY Left 10/10/2017   Procedure: LEFT TOTAL KNEE ARTHROPLASTY;  Surgeon: Gaynelle Arabian, MD;  Location: [facility];  Service: Orthopedics;  Laterality: Left;  50 mins   Family History  Problem Relation Age of Onset   Heart disease Father    Alcohol abuse Father    Obesity Brother    Asperger's syndrome Brother    Obesity Mother    Tremor Mother    Anxiety disorder Mother    Allergies as of  03/04/2022       Reactions   Penicillins Rash, Other (See Comments)   Has patient had a PCN reaction causing immediate rash, facial/tongue/throat swelling, SOB or lightheadedness with hypotension: Unknown Has patient had a PCN reaction causing severe rash involving mucus membranes or skin necrosis: No Has patient had a PCN reaction that required hospitalization: No Has patient had a PCN reaction occurring within the last 10 years: No If all of the above answers are "NO", then may proceed with Cephalosporin use.        Medication List        Accurate as of March 04, 2022  3:10 PM. If you have any questions, ask your nurse or doctor.          doxycycline 100 MG tablet Commonly known as: VIBRA-TABS Take 1 tablet (100 mg total) by mouth 2 (two) times daily. Started by: Howard Pouch, DO   fluticasone 50 MCG/ACT nasal spray Commonly known as: FLONASE Place 2 sprays into both nostrils daily. Started by: Howard Pouch, DO   LORazepam 1 MG tablet Commonly known as: ATIVAN Take 1 tablet (1 mg total) by mouth daily as needed for anxiety.   methocarbamol 500 MG tablet Commonly known as: ROBAXIN Take 1 tablet (500 mg total) by mouth 2 (two) times daily as needed for muscle spasms. Started by: Howard Pouch, DO   omega-3 acid ethyl esters 1 g capsule Commonly known as: LOVAZA Take 2 capsules (2 g total) by mouth 2 (two) times daily.   traMADol 50 MG tablet Commonly known as: ULTRAM Take 1-2 tablets (50-100 mg total) by mouth every 12 (twelve) hours as needed (mild pain).   Turmeric 500 MG Caps Take 1 tablet by mouth.        All past medical history, surgical history, allergies, family history, immunizations andmedications were updated in the EMR today and reviewed under the history and medication portions of their EMR.     Review of Systems  Constitutional:  Positive for malaise/fatigue. Negative for chills and fever.  HENT:  Positive for congestion and sinus pain. Negative  for ear discharge and sore throat.   Eyes:  Negative for pain and discharge.  Respiratory:  Positive for cough, sputum production and wheezing. Negative for shortness of breath.   Gastrointestinal:  Negative for diarrhea, nausea and vomiting.  Skin:  Negative for itching.  Neurological:  Positive for headaches. Negative for dizziness.  Negative, with the exception of above mentioned in  HPI   Objective:  BP 117/72   Pulse 80   Temp 98.3 F (36.8 C) (Oral)   Ht 6' 1.25" (1.861 m)   Wt 236 lb (107 kg)   SpO2 96%   BMI 30.92 kg/m  Body mass index is 30.92 kg/m. Physical Exam Vitals and nursing note reviewed.  Constitutional:      General: He is not in acute distress.    Appearance: Normal appearance. He is not ill-appearing, toxic-appearing or diaphoretic.  HENT:     Head: Normocephalic and atraumatic.     Right Ear: Tympanic membrane, ear canal and external ear normal. There is no impacted cerumen.     Left Ear: Tympanic membrane, ear canal and external ear normal. There is no impacted cerumen.     Nose: Congestion and rhinorrhea present.     Mouth/Throat:     Mouth: Mucous membranes are moist.     Pharynx: Posterior oropharyngeal erythema present. No oropharyngeal exudate.  Eyes:     General: No scleral icterus.       Right eye: No discharge.        Left eye: No discharge.     Extraocular Movements: Extraocular movements intact.     Pupils: Pupils are equal, round, and reactive to light.  Cardiovascular:     Rate and Rhythm: Normal rate and regular rhythm.     Heart sounds: No murmur heard. Pulmonary:     Effort: Pulmonary effort is normal. No respiratory distress.     Breath sounds: Rhonchi present. No wheezing or rales.  Musculoskeletal:     Right lower leg: No edema.     Left lower leg: No edema.  Lymphadenopathy:     Cervical: Cervical adenopathy present.  Skin:    General: Skin is warm and dry.     Coloration: Skin is not jaundiced or pale.     Findings: No  rash.  Neurological:     Mental Status: He is alert and oriented to person, place, and time. Mental status is at baseline.  Psychiatric:        Mood and Affect: Mood normal.        Behavior: Behavior normal.        Thought Content: Thought content normal.        Judgment: Judgment normal.    No results found. No results found. Results for orders placed or performed in visit on 03/04/22 (from the past 24 hour(s))  POC COVID-19 BinaxNow     Status: Normal   Collection Time: 03/04/22 10:27 AM  Result Value Ref Range   SARS Coronavirus 2 Ag Negative Negative    Assessment/Plan: ROONEY SWAILS is a 71 y.o. male present for OV for  Acute cough/bacterial sinusitis - POC COVID-19 BinaxNow> negative - methylPREDNISolone acetate (DEPO-MEDROL) injection 80 mg Rest, hydrate.  +/- flonase,  nettie pot or nasal saline.  Doxy twice daily prescribed, take until completed.  F/U 2 weeks if not improved.   Muscle spasms of both lower extremities Current intermittent muscle spasms of his lower extremities. We agreed we will call in a Robaxin prescription for him since this had worked for him in the past.  He understands he is not to use this nightly.  He is to use only if muscle spasms start to occur.  Reviewed expectations re: course of current medical issues. Discussed self-management of symptoms. Outlined signs and symptoms indicating need for more acute intervention. Patient verbalized understanding and all questions were answered. Patient received an  After-Visit Summary.    Orders Placed This Encounter  Procedures   POC COVID-19 BinaxNow   Meds ordered this encounter  Medications   doxycycline (VIBRA-TABS) 100 MG tablet    Sig: Take 1 tablet (100 mg total) by mouth 2 (two) times daily.    Dispense:  20 tablet    Refill:  0   fluticasone (FLONASE) 50 MCG/ACT nasal spray    Sig: Place 2 sprays into both nostrils daily.    Dispense:  16 g    Refill:  0   methocarbamol  (ROBAXIN) 500 MG tablet    Sig: Take 1 tablet (500 mg total) by mouth 2 (two) times daily as needed for muscle spasms.    Dispense:  180 tablet    Refill:  0   methylPREDNISolone acetate (DEPO-MEDROL) injection 80 mg   Referral Orders  No referral(s) requested today     Note is dictated utilizing voice recognition software. Although note has been proof read prior to signing, occasional typographical errors still can be missed. If any questions arise, please do not hesitate to call for verification.   electronically signed by:  Howard Pouch, DO  [place]

## 2022-03-04 NOTE — Patient Instructions (Addendum)
Return if symptoms worsen or fail to improve.        Great to see you today.  I have refilled the medication(s) we provide.   If labs were collected, we will inform you of lab results once received either by echart message or telephone call.   - echart message- for normal results that have been seen by the patient already.   - telephone call: abnormal results or if patient has not viewed results in their echart.  

## 2022-03-25 ENCOUNTER — Encounter: Payer: Self-pay | Admitting: Family Medicine

## 2022-04-21 ENCOUNTER — Other Ambulatory Visit: Payer: Self-pay | Admitting: Family Medicine

## 2022-04-21 DIAGNOSIS — G250 Essential tremor: Secondary | ICD-10-CM

## 2022-04-23 ENCOUNTER — Other Ambulatory Visit: Payer: Self-pay | Admitting: Family Medicine

## 2022-04-23 DIAGNOSIS — G250 Essential tremor: Secondary | ICD-10-CM

## 2022-04-28 ENCOUNTER — Other Ambulatory Visit: Payer: Self-pay | Admitting: Family Medicine

## 2022-04-28 DIAGNOSIS — G250 Essential tremor: Secondary | ICD-10-CM

## 2022-05-19 ENCOUNTER — Ambulatory Visit (INDEPENDENT_AMBULATORY_CARE_PROVIDER_SITE_OTHER): Payer: Medicare Other | Admitting: Family Medicine

## 2022-05-19 ENCOUNTER — Encounter: Payer: Self-pay | Admitting: Family Medicine

## 2022-05-19 VITALS — BP 158/80 | HR 60 | Temp 97.4°F | Ht 73.25 in | Wt 235.0 lb

## 2022-05-19 DIAGNOSIS — F4010 Social phobia, unspecified: Secondary | ICD-10-CM

## 2022-05-19 DIAGNOSIS — E781 Pure hyperglyceridemia: Secondary | ICD-10-CM

## 2022-05-19 DIAGNOSIS — G250 Essential tremor: Secondary | ICD-10-CM | POA: Diagnosis not present

## 2022-05-19 DIAGNOSIS — I10 Essential (primary) hypertension: Secondary | ICD-10-CM

## 2022-05-19 DIAGNOSIS — C4492 Squamous cell carcinoma of skin, unspecified: Secondary | ICD-10-CM

## 2022-05-19 DIAGNOSIS — C4491 Basal cell carcinoma of skin, unspecified: Secondary | ICD-10-CM

## 2022-05-19 DIAGNOSIS — Z79899 Other long term (current) drug therapy: Secondary | ICD-10-CM

## 2022-05-19 MED ORDER — LORAZEPAM 1 MG PO TABS: 1.0000 mg | ORAL_TABLET | Freq: Every day | ORAL | 1 refills | Status: DC | PRN

## 2022-05-19 MED ORDER — OMEGA-3-ACID ETHYL ESTERS 1 G PO CAPS: 2.0000 g | ORAL_CAPSULE | Freq: Two times a day (BID) | ORAL | 3 refills | Status: DC

## 2022-05-19 MED ORDER — TRAMADOL HCL 50 MG PO TABS: 50.0000 mg | ORAL_TABLET | Freq: Two times a day (BID) | ORAL | 1 refills | Status: DC | PRN

## 2022-05-19 NOTE — Patient Instructions (Signed)
No follow-ups on file.        Great to see you today.  I have refilled the medication(s) we provide.   If labs were collected, we will inform you of lab results once received either by echart message or telephone call.   - echart message- for normal results that have been seen by the patient already.   - telephone call: abnormal results or if patient has not viewed results in their echart.  

## 2022-05-19 NOTE — Progress Notes (Signed)
Patient ID: Henry Knight, male  DOB: 1951-03-09, 71 y.o.   MRN: [id number] Patient Care Team    Relationship Specialty Notifications Start End  Ma Hillock, DO PCP - General Family Medicine  07/11/15   Carol Ada, MD Consulting Physician Gastroenterology  08/11/16   Gaynelle Arabian, MD Consulting Physician Orthopedic Surgery  08/11/16   Melida Quitter, MD Consulting Physician Otolaryngology  08/11/16   Sciences, [facility] (Inactive)    10/13/16    Comment: plastic and reconstructiion [facility] (on [name] in [place])  Lavonna Monarch, MD Consulting Physician Dermatology  06/30/17     Chief Complaint  Patient presents with   Hypertension    Pt is not fasting    Subjective:  Henry Knight is a 71 y.o. male present for [facility]. All past medical history, surgical history, allergies, family history, immunizations, medications and social history were updated in the electronic medical record today. All recent labs, ED visits and hospitalizations within the last year were reviewed.   HLD/obesity:  Refused statin.  Started fenofibrate and then refused. He has refused many blood pressure medicines in the past as well. He is taking  his fish oil.   Arthritis/chronic pain: pt reports compliance with tramadol. He rarely uses now. as needed for pain- taking about 3-4x/mo.    Indication for chronic opioid: osteoarthiritis Medication and dose: Tramadol 50-100 Mg QD PRN for pain /Ativan 1 mg daily for tremor/anxiety (about 15-20 times a month-sometimes 2 times a day but rarely).  # pills per 90d: #30 tramadol/#180 Ativan#90 Last UDS date:next appt Pain contract signed (Y/N): Y Date narcotic database last reviewed (include red flags):  05/21/22     Essential tremor/anxiety: Pt has h/o essential tremor and is prescribed ativan 1 mg QD PRN. He has used the medication for years and reports no side effects from medicine.    Patient reports the Ativan works well.  He had  looked into other types of therapy with brain stimulators etc. and decided he would not like to go down the road and would stay on Ativan.  He reports he takes the Ativan as needed with his tremor is more active or when he has social engagements etc.     01/27/2022    8:02 AM 06/18/2021   10:32 AM 11/20/2020   10:38 AM 11/20/2020   10:28 AM 01/18/2020    9:18 AM  Depression screen PHQ 2/9  Decreased Interest 0 0 1 0 0  Down, Depressed, Hopeless 0 0 0 0 0  PHQ - 2 Score 0 0 1 0 0  Altered sleeping  1 1    Tired, decreased energy  1 1    Change in appetite  0 0    Feeling bad or failure about yourself   0 1    Trouble concentrating  0 0    Moving slowly or fidgety/restless  0 0    Suicidal thoughts  0 0    PHQ-9 Score  2 4    Difficult doing work/chores   Not difficult at all        11/20/2020   10:38 AM 01/18/2020    9:18 AM 07/18/2019   10:56 AM 11/16/2018   10:42 AM  GAD 7 : Generalized Anxiety Score  Nervous, Anxious, on Edge 1 0 1 1  Control/stop worrying 1 0 0 0  Worry too much - different things 1 0 0 0  Trouble relaxing 1 3 1  1  Restless '1 3 1 1  '$ Easily annoyed or irritable 1 0 1 0  Afraid - awful might happen 0 0 0 0  Total GAD 7 Score '6 6 4 3  '$ Anxiety Difficulty Not difficult at all Not difficult at all Not difficult at all Not difficult at all          03/04/2022   10:04 AM 01/27/2022    8:05 AM 06/18/2021   10:32 AM 06/18/2021   10:31 AM 11/20/2020   10:28 AM  Fall Risk   Falls in the past year? '1 1 1 '$ 0 0  Comment   rolled off the couch and hit his head    Number falls in past yr: 0 0 0 0 0  Comment  rolled off couch     Injury with Fall? 0 1 0 0 0  Comment  hit head rolled off the couch and hit his head    Risk for fall due to : History of fall(s)      Follow up Falls evaluation completed Falls prevention discussed   Falls evaluation completed    Immunization History  Administered Date(s) Administered   Fluad Quad(high Dose 65+) 07/18/2019, 06/18/2021    Influenza, High Dose Seasonal PF 08/11/2016, 07/29/2017, 07/19/2018   Influenza,inj,Quad PF,6+ Mos 07/11/2013, 08/07/2014, 07/11/2015   Influenza-Unspecified 07/27/2017, 08/02/2020   PFIZER Comirnaty(Gray Top)Covid-19 Tri-Sucrose Vaccine 02/16/2021   PFIZER(Purple Top)SARS-COV-2 Vaccination 10/25/2019, 11/15/2019, 07/03/2020   PNEUMOCOCCAL CONJUGATE-20 06/18/2021   Pfizer Covid-19 Vaccine Bivalent Booster 40yr & up 07/24/2021   Pneumococcal Conjugate-13 08/11/2016, 11/16/2018   Td 03/27/2020   Tdap 06/26/2014   Zoster Recombinat (Shingrix) 02/11/2022    Past Medical History:  Diagnosis Date   Arthritis    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 HBenson Norway   HTN (hypertension)    Melanoma ([place] [id number]  MM 2 CHEST TX EXC   [place]uromuscular [place]    SCC (squamous cell carcinoma) 05/15/2007   SCCA RIGHT FOREARM TX WITH BX   SCC (squamous cell carcinoma) 07/04/2013   WELL DIFF SCC LEFT FOREARM CX3 5FU   SCC (squamous cell carcinoma) 06/26/2014   SCC IN SITU TOP OF SCALP TX CX3 5FU   SCC (squamous cell carcinoma) 07/27/2017   SCC IN SITU RIGHT SHOULDER TX CX3 5FU   Squamous cell carcinoma of skin 11/10/2004   SCC IN SITU RIGHT FOREARM CX3 5FU   Allergies  Allergen Reactions   Penicillins Rash and Other (See Comments)    Has patient had a PCN reaction causing immediate rash, facial/tongue/throat swelling, SOB or lightheadedness with hypotension: Unknown Has patient had a PCN reaction causing severe rash involving mucus membranes or skin necrosis: No Has patient had a PCN reaction that required hospitalization: No Has patient had a PCN reaction occurring within the last 10 years: No If all of the above answers are "NO", then may proceed with Cephalosporin use.     Past Surgical History:  Procedure Laterality Date   COLONOSCOPY W/ POLYPECTOMY  approx 2007; 11/2014   Dr. HMarcy Panning5 yrs    COSMETIC SURGERY     laser peels, [facility] Plastic and reconstructive surgery (Clearence Cheek for acne scars   KNEE SURGERY Right 2012   SHOULDER ARTHROSCOPY W/ ROTATOR  CUFF REPAIR  2004   TOTAL KNEE ARTHROPLASTY Left 10/10/2017   Procedure: LEFT TOTAL KNEE ARTHROPLASTY;  Surgeon: Gaynelle Arabian, MD;  Location: [facility];  Service: Orthopedics;  Laterality: Left;  50 mins   Family History  Problem Relation Age of Onset   Heart disease Father    Alcohol abuse Father    Obesity Brother    Asperger's syndrome Brother    Obesity Mother    Tremor Mother    Anxiety disorder Mother    Social History   Social History Narrative   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    Allergies as of 05/19/2022       Reactions   Penicillins Rash, Other (See Comments)   Has patient had a PCN reaction causing immediate rash, facial/tongue/throat swelling, SOB or lightheadedness with hypotension: Unknown Has patient had a PCN reaction causing severe rash involving mucus membranes or skin necrosis: No Has patient had a PCN reaction that required hospitalization: No Has patient had a PCN reaction occurring within the last 10 years: No If all of the above answers are "NO", then may proceed with Cephalosporin use.        Medication List        Accurate as of May 19, 2022 11:59 PM. If you have any questions, ask your nurse or doctor.          STOP taking these medications    doxycycline 100 MG tablet Commonly known as: VIBRA-TABS Stopped by: Howard Pouch, DO   fluticasone 50 MCG/ACT nasal spray Commonly known as: FLONASE Stopped by: Howard Pouch, DO   gabapentin 300 MG capsule Commonly known as: NEURONTIN Stopped by: Howard Pouch, DO   Xarelto 10 MG Tabs tablet Generic drug: rivaroxaban Stopped by: Howard Pouch, DO        TAKE these medications    LORazepam 2 MG tablet Commonly known as: ATIVAN Take 0.5 tablets (1 mg total) by mouth daily as needed for anxiety. What changed: medication strength Changed by: Howard Pouch, DO   methocarbamol 500 MG tablet Commonly known as: ROBAXIN Take 1 tablet (500 mg total) by mouth 2 (two) times daily as needed for muscle spasms.   omega-3 acid ethyl esters 1 g capsule Commonly known as: LOVAZA Take 2 capsules (2 g total) by mouth 2 (two) times daily.   traMADol 50 MG tablet Commonly known as: ULTRAM Take 1-2 tablets (50-100 mg total) by mouth every 12 (twelve) hours as needed (mild pain).   Turmeric 500 MG Caps Take 1 tablet by mouth.       All past medical history, surgical history, allergies, family history, immunizations andmedications were updated in the EMR today and reviewed under the history and medication portions of their EMR.     Recent Results (from the past 2160 hour(s))  POC COVID-19 BinaxNow     Status: Normal   Collection Time: 03/04/22 10:27 AM  Result Value Ref Range   SARS Coronavirus 2 Ag Negative Negative    No results found.   ROS: 14 pt review of systems performed and negative (unless mentioned in an HPI)  Objective: BP (!) 158/80   Pulse 60   Temp (!) 97.4 F (36.3 C) (Oral)   Ht 6' 1.25" (1.861 m)   Wt 235 lb (106.6 kg)   SpO2 96%   BMI 30.79 kg/m  Physical Exam Vitals and nursing note reviewed.  Constitutional:      General: He is  not in acute distress.    Appearance: Normal appearance. He is not ill-appearing, toxic-appearing or diaphoretic.  HENT:     Head: Normocephalic and atraumatic.     Mouth/Throat:     Mouth: Mucous membranes are moist.  Eyes:     General: No scleral icterus.       Right eye: No discharge.        Left eye: No discharge.     Extraocular Movements: Extraocular movements intact.     Pupils: Pupils are equal, round, and reactive to light.  Cardiovascular:     Rate and Rhythm: Normal  rate and regular rhythm.  Pulmonary:     Effort: Pulmonary effort is normal. No respiratory distress.     Breath sounds: Normal breath sounds. No wheezing, rhonchi or rales.  Musculoskeletal:     Cervical back: Neck supple.  Lymphadenopathy:     Cervical: No cervical adenopathy.  Skin:    General: Skin is warm and dry.     Coloration: Skin is not jaundiced or pale.     Findings: No rash.  Neurological:     Mental Status: He is alert and oriented to person, place, and time. Mental status is at baseline.  Psychiatric:        Mood and Affect: Mood normal.        Behavior: Behavior normal.        Thought Content: Thought content normal.        Judgment: Judgment normal.     No results found.  Assessment/plan: Henry Knight is a 71 y.o. male present for [facility] HTN/Hypertriglyceridemia/Obesity (BMI 30-39.9) -Above goal again today.  He has declined starting blood pressure meds.  He frequently will have a reason he believes his blood pressures are higher.  We pulled up the trend of his blood pressures for him today so that he could acknowledge that his blood pressures have been elevated for a few years without treatment now. He will continue to monitor at home and if above 130/80 he will call and schedule appointment to start blood pressure medication again - pt noncompliant with HTN meds.  - continue  lovaza -labs utd - diet and routine exercise.    Chronic prescription benzodiazepine use/Controlled substance agreement signed/Primary osteoarthritis involving multiple joints/Social anxiety disorder/ Essential tremor Stable Continue tramadol as needed Continue Ativan as needed # pills per 90d: tramadol/#90 Ativan Last UDS date: Up-to-date Pain contract signed (Y/N): Y, Date narcotic database last reviewed (include red flags):  05/21/22     Return in about 24 weeks (around 11/03/2022) for Routine chronic condition follow-up.  Orders Placed This Encounter  Procedures    Ambulatory referral to Dermatology      Meds ordered this encounter  Medications   omega-3 acid ethyl esters (LOVAZA) 1 g capsule    Sig: Take 2 capsules (2 g total) by mouth 2 (two) times daily.    Dispense:  120 capsule    Refill:  3    Hypertriglyceridemia;   traMADol (ULTRAM) 50 MG tablet    Sig: Take 1-2 tablets (50-100 mg total) by mouth every 12 (twelve) hours as needed (mild pain).    Dispense:  90 tablet    Refill:  1   DISCONTD: LORazepam (ATIVAN) 1 MG tablet    Sig: Take 1 tablet (1 mg total) by mouth daily as needed for anxiety.    Dispense:  90 tablet    Refill:  1   LORazepam (ATIVAN) 2 MG tablet  Sig: Take 0.5 tablets (1 mg total) by mouth daily as needed for anxiety.    Dispense:  45 tablet    Refill:  1    Dc 1 mg d/t out of stock    Referral Orders         Ambulatory referral to Dermatology       Note is dictated utilizing voice recognition software. Although note has been proof read prior to signing, occasional typographical errors still can be missed. If any questions arise, please do not hesitate to call for verification.  Electronically signed by: Howard Pouch, DO [place]

## 2022-05-20 ENCOUNTER — Telehealth: Payer: Self-pay | Admitting: Family Medicine

## 2022-05-20 MED ORDER — LORAZEPAM 2 MG PO TABS: 1.0000 mg | ORAL_TABLET | Freq: Every day | ORAL | 1 refills | Status: DC | PRN

## 2022-05-20 NOTE — Telephone Encounter (Signed)
Patient called and stated none of the local [facility] have LaRazepam '1mg'$  in stock. They do have Lorazepam '2mg'$  and patient is wanting to know if a temporary script can be sent for '2mg'$  to cut in half until they get the '1mg'$  in?   Please advise.   Patient would like a call confirming this being sent to pharmacy.

## 2022-05-20 NOTE — Telephone Encounter (Signed)
Confirmed with pharmacy, updated prescription was received.

## 2022-05-20 NOTE — Telephone Encounter (Signed)
Patient advised new prescription sent.

## 2022-05-20 NOTE — Telephone Encounter (Signed)
Patient was seen yesterday for follow up appointment. [id number] [place] [place]  Please advise if this can be done.

## 2022-05-20 NOTE — Telephone Encounter (Signed)
Called in the new prescription for him

## 2022-06-23 DIAGNOSIS — G250 Essential tremor: Secondary | ICD-10-CM | POA: Diagnosis not present

## 2022-06-23 DIAGNOSIS — Z86010 Personal history of colonic polyps: Secondary | ICD-10-CM | POA: Diagnosis not present

## 2022-07-21 ENCOUNTER — Other Ambulatory Visit: Payer: Self-pay | Admitting: Family Medicine

## 2022-07-21 DIAGNOSIS — G250 Essential tremor: Secondary | ICD-10-CM

## 2022-07-22 DIAGNOSIS — L728 Other follicular cysts of the skin and subcutaneous tissue: Secondary | ICD-10-CM | POA: Diagnosis not present

## 2022-07-22 DIAGNOSIS — D1801 Hemangioma of skin and subcutaneous tissue: Secondary | ICD-10-CM | POA: Diagnosis not present

## 2022-07-22 DIAGNOSIS — L814 Other melanin hyperpigmentation: Secondary | ICD-10-CM | POA: Diagnosis not present

## 2022-07-22 DIAGNOSIS — L821 Other seborrheic keratosis: Secondary | ICD-10-CM | POA: Diagnosis not present

## 2022-07-28 ENCOUNTER — Ambulatory Visit: Payer: Medicare Other | Admitting: Dermatology

## 2022-08-19 DIAGNOSIS — K5730 Diverticulosis of large intestine without perforation or abscess without bleeding: Secondary | ICD-10-CM | POA: Diagnosis not present

## 2022-08-19 DIAGNOSIS — K635 Polyp of colon: Secondary | ICD-10-CM | POA: Diagnosis not present

## 2022-08-19 DIAGNOSIS — Z86010 Personal history of colonic polyps: Secondary | ICD-10-CM | POA: Diagnosis not present

## 2022-08-19 LAB — HM COLONOSCOPY

## 2022-08-30 ENCOUNTER — Other Ambulatory Visit: Payer: Self-pay | Admitting: Family Medicine

## 2022-08-30 DIAGNOSIS — G250 Essential tremor: Secondary | ICD-10-CM

## 2022-09-13 ENCOUNTER — Telehealth: Payer: Self-pay

## 2022-09-13 NOTE — Telephone Encounter (Signed)
LVM for pt to [facility] regarding referral.    Note: Doroteo Glassman MD per referral notes in 2017

## 2022-09-13 NOTE — Telephone Encounter (Signed)
Patient stated that prior to [place] in 2020, Dr. Raoul Pitch gave patient a name of a marriage counselor. I could not find name and/or referral where name of counselor was documented.    Please advise.  [phone]

## 2022-09-14 NOTE — Telephone Encounter (Signed)
Pt informed per Denese Killings.

## 2022-10-07 ENCOUNTER — Telehealth: Payer: Self-pay | Admitting: Family Medicine

## 2022-10-07 NOTE — Telephone Encounter (Signed)
Dr. Blair Heys Optometrist in [place], [place] Address: [address], [place], [place] [address] Phone: [phone]

## 2022-10-07 NOTE — Telephone Encounter (Signed)
Pt is requesting the name and information to the eye Dr. In [place] that Dr. Raoul Pitch goes to.He says she told him he needs to go to one, but he can not recall the name. Please give the patient a call at   [phone]

## 2022-10-07 NOTE — Telephone Encounter (Signed)
LM for pt to return call to discuss.  

## 2022-10-08 NOTE — Telephone Encounter (Signed)
Provided pt with the eye Dr information he requested.

## 2022-10-08 NOTE — Telephone Encounter (Signed)
LVM for pt to [facility] regarding referral info.

## 2022-11-17 DIAGNOSIS — H2513 Age-related nuclear cataract, bilateral: Secondary | ICD-10-CM | POA: Diagnosis not present

## 2022-11-17 DIAGNOSIS — B5801 Toxoplasma chorioretinitis: Secondary | ICD-10-CM | POA: Diagnosis not present

## 2022-12-30 ENCOUNTER — Ambulatory Visit (INDEPENDENT_AMBULATORY_CARE_PROVIDER_SITE_OTHER): Payer: Medicare Other | Admitting: Family Medicine

## 2022-12-30 ENCOUNTER — Encounter: Payer: Self-pay | Admitting: Family Medicine

## 2022-12-30 VITALS — BP 134/86 | HR 61 | Temp 98.3°F | Ht 72.44 in | Wt 239.0 lb

## 2022-12-30 DIAGNOSIS — Z0000 Encounter for general adult medical examination without abnormal findings: Secondary | ICD-10-CM

## 2022-12-30 DIAGNOSIS — I10 Essential (primary) hypertension: Secondary | ICD-10-CM

## 2022-12-30 DIAGNOSIS — F4010 Social phobia, unspecified: Secondary | ICD-10-CM

## 2022-12-30 DIAGNOSIS — E781 Pure hyperglyceridemia: Secondary | ICD-10-CM

## 2022-12-30 DIAGNOSIS — G250 Essential tremor: Secondary | ICD-10-CM

## 2022-12-30 DIAGNOSIS — Z125 Encounter for screening for malignant neoplasm of prostate: Secondary | ICD-10-CM | POA: Diagnosis not present

## 2022-12-30 DIAGNOSIS — M159 Polyosteoarthritis, unspecified: Secondary | ICD-10-CM

## 2022-12-30 DIAGNOSIS — Z131 Encounter for screening for diabetes mellitus: Secondary | ICD-10-CM | POA: Diagnosis not present

## 2022-12-30 DIAGNOSIS — Z79899 Other long term (current) drug therapy: Secondary | ICD-10-CM

## 2022-12-30 LAB — CBC WITH DIFFERENTIAL/PLATELET
Basophils Absolute: 0 10*3/uL (ref 0.0–0.1)
Basophils Relative: 0.8 % (ref 0.0–3.0)
Eosinophils Absolute: 0.1 10*3/uL (ref 0.0–0.7)
Eosinophils Relative: 2.5 % (ref 0.0–5.0)
HCT: 44.8 % (ref 39.0–52.0)
Hemoglobin: 15.7 g/dL (ref 13.0–17.0)
Lymphocytes Relative: 39.8 % (ref 12.0–46.0)
Lymphs Abs: 1.7 10*3/uL (ref 0.7–4.0)
MCHC: 35 g/dL (ref 30.0–36.0)
MCV: 96.5 fl (ref 78.0–100.0)
Monocytes Absolute: 0.5 10*3/uL (ref 0.1–1.0)
Monocytes Relative: 12.2 % — ABNORMAL HIGH (ref 3.0–12.0)
Neutro Abs: 1.9 10*3/uL (ref 1.4–7.7)
Neutrophils Relative %: 44.7 % (ref 43.0–77.0)
Platelets: 216 10*3/uL (ref 150.0–400.0)
RBC: 4.64 Mil/uL (ref 4.22–5.81)
RDW: 12.8 % (ref 11.5–15.5)
WBC: 4.3 10*3/uL (ref 4.0–10.5)

## 2022-12-30 LAB — COMPREHENSIVE METABOLIC PANEL
ALT: 16 U/L (ref 0–53)
AST: 18 U/L (ref 0–37)
Albumin: 4.6 g/dL (ref 3.5–5.2)
Alkaline Phosphatase: 64 U/L (ref 39–117)
BUN: 19 mg/dL (ref 6–23)
CO2: 24 mEq/L (ref 19–32)
Calcium: 9.3 mg/dL (ref 8.4–10.5)
Chloride: 106 mEq/L (ref 96–112)
Creatinine, Ser: 0.9 mg/dL (ref 0.40–1.50)
GFR: 85.01 mL/min (ref >60.00)
Glucose, Bld: 83 mg/dL (ref 70–99)
Potassium: 4.3 mEq/L (ref 3.5–5.1)
Sodium: 137 mEq/L (ref 135–145)
Total Bilirubin: 0.9 mg/dL (ref 0.2–1.2)
Total Protein: 6.9 g/dL (ref 6.0–8.3)

## 2022-12-30 LAB — HEMOGLOBIN A1C: Hgb A1c MFr Bld: 5.4 % (ref 4.6–6.5)

## 2022-12-30 LAB — PSA, MEDICARE: PSA: 1.52 ng/ml (ref 0.10–4.00)

## 2022-12-30 LAB — LIPID PANEL
Cholesterol: 199 mg/dL (ref 0–200)
HDL: 31.2 mg/dL — ABNORMAL LOW (ref >39.00)
NonHDL: 168.08
Total CHOL/HDL Ratio: 6
Triglycerides: 210 mg/dL — ABNORMAL HIGH (ref 0.0–149.0)
VLDL: 42 mg/dL — ABNORMAL HIGH (ref 0.0–40.0)

## 2022-12-30 LAB — TSH: TSH: 3.55 u[IU]/mL (ref 0.35–5.50)

## 2022-12-30 LAB — LDL CHOLESTEROL, DIRECT: Direct LDL: 112 mg/dL

## 2022-12-30 MED ORDER — TRAMADOL HCL 50 MG PO TABS: 50.0000 mg | ORAL_TABLET | Freq: Two times a day (BID) | ORAL | 1 refills | Status: DC | PRN

## 2022-12-30 MED ORDER — OMEGA-3-ACID ETHYL ESTERS 1 G PO CAPS: 2.0000 g | ORAL_CAPSULE | Freq: Two times a day (BID) | ORAL | 3 refills | Status: DC

## 2022-12-30 MED ORDER — LORAZEPAM 1 MG PO TABS: 1.0000 mg | ORAL_TABLET | Freq: Two times a day (BID) | ORAL | 1 refills | Status: DC | PRN

## 2022-12-30 NOTE — Patient Instructions (Addendum)
Return in about 24 weeks (around 06/16/2023) for Routine chronic condition follow-up.        Great to see you today.  I have refilled the medication(s) we provide.   If labs were collected, we will inform you of lab results once received either by echart message or telephone call.   - echart message- for normal results that have been seen by the patient already.   - telephone call: abnormal results or if patient has not viewed results in their echart.

## 2022-12-30 NOTE — Progress Notes (Signed)
Patient ID: Henry Knight, male  DOB: 04/15/1951, 72 y.o.   MRN: [id number] Patient Care Team    Relationship Specialty Notifications Start End  Ma Hillock, DO PCP - General Family Medicine  07/11/15   Carol Ada, MD Consulting Physician Gastroenterology  08/11/16   Gaynelle Arabian, MD Consulting Physician Orthopedic Surgery  08/11/16   Melida Quitter, MD Consulting Physician Otolaryngology  08/11/16   Sciences, [facility] (Inactive)    10/13/16    Comment: plastic and reconstructiion [facility] (on Henry Knight in [place])  Lavonna Monarch, MD (Inactive) Consulting Physician Dermatology  06/30/17     Chief Complaint  Patient presents with   Annual Exam    [facility]; pt is not fasting    Subjective:  Henry Knight is a 72 y.o. male present for [place]. All past medical history, surgical history, allergies, family history, immunizations, medications and social history were updated in the electronic medical record today. All recent labs, ED visits and hospitalizations within the last year were reviewed.  Health maintenance:  Colonoscopy: 2023 recommend follow up 7 years Completed by Dr. Benson Norway.  Immunizations:  tdap UTD 2015, influenza admin UTD-today, PNA series completed- today, Shingrix series completed. Covid x2- counseled booster.  Infectious disease screening:  Hep C completed PSA:  Lab Results  Component Value Date   PSA 0.92 06/18/2021   PSA 0.97 01/18/2020   PSA 1.39 11/16/2018  , pt was counseled on prostate cancer screenings.  Assistive device: none Oxygen [id number] Patient has a Dental home. Hospitalizations/ED visits: reviewed  HLD/obesity:  Refused statin.  Started fenofibrate and then refused. He has refused many blood pressure medicines in the past as well. He is taking  his fish oil.   Arthritis/chronic pain: pt reports compliance with tramadol. He rarely uses now. as needed for pain- taking about 3-4x/mo.    Indication for chronic opioid:  osteoarthiritis Medication and dose: Tramadol 50-100 Mg QD PRN for pain /Ativan 1 mg daily for tremor/anxiety (about 15-20 times a month-sometimes 2 times a day but rarely).  # pills per 90d: #30 tramadol/#180 Ativan#90 Last UDS date:next appt Pain contract signed (Y/N): Y Date narcotic database last reviewed (include red flags):  11/20/20   Essential tremor/anxiety: Pt has h/o essential tremor and is prescribed ativan 1 mg QD PRN. He has used the medication for years and reports no side effects from medicine.    Patient reports the Ativan works well enough.  He had looked into other types of therapy with brain stimulators etc. and decided he would not like to go down the road and would stay on Ativan.  He reports he takes the Ativan as needed with his tremor is more active or when he has social engagements etc.     12/30/2022    8:59 AM 01/27/2022    8:02 AM 06/18/2021   10:32 AM 11/20/2020   10:38 AM 11/20/2020   10:28 AM  Depression screen PHQ 2/9  Decreased Interest 0 0 0 1 0  Down, Depressed, Hopeless 0 0 0 0 0  PHQ - 2 Score 0 0 0 1 0  Altered sleeping   1 1   Tired, decreased energy   1 1   Change in appetite   0 0   Feeling bad or failure about yourself    0 1   Trouble concentrating   0 0   Moving slowly or fidgety/restless   0 0   Suicidal thoughts   0  0   PHQ-9 Score   2 4   Difficult doing work/chores    Not difficult at all       11/20/2020   10:38 AM 01/18/2020    9:18 AM 07/18/2019   10:56 AM 11/16/2018   10:42 AM  GAD 7 : Generalized Anxiety Score  Nervous, Anxious, on Edge 1 0 1 1  Control/stop worrying 1 0 0 0  Worry too much - different things 1 0 0 0  Trouble relaxing 1 3 1 1   Restless 1 3 1 1   Easily annoyed or irritable 1 0 1 0  Afraid - awful might happen 0 0 0 0  Total GAD 7 Score 6 6 4 3   Anxiety Difficulty Not difficult at all Not difficult at all Not difficult at all Not difficult at all          12/30/2022    8:59 AM 03/04/2022   10:04 AM 01/27/2022     8:05 AM 06/18/2021   10:32 AM 06/18/2021   10:31 AM  Fall Risk   Falls in the past year? 0 1 1 1  0  Comment    rolled off the couch and hit his head   Number falls in past yr: 0 0 0 0 0  Comment   rolled off couch    Injury with Fall? 0 0 1 0 0  Comment   hit head rolled off the couch and hit his head   Risk for fall due to :  History of fall(s)     Follow up Falls evaluation completed Falls evaluation completed Falls prevention discussed      Immunization History  Administered Date(s) Administered   Fluad Quad(high Dose 65+) 07/18/2019, 06/18/2021   Influenza, High Dose Seasonal PF 08/11/2016, 07/29/2017, 07/19/2018, 07/22/2022   Influenza,inj,Quad PF,6+ Mos 07/11/2013, 08/07/2014, 07/11/2015   Influenza-Unspecified 07/27/2017, 08/02/2020   PFIZER Comirnaty(Gray Top)Covid-19 Tri-Sucrose Vaccine 02/16/2021   PFIZER(Purple Top)SARS-COV-2 Vaccination 10/25/2019, 11/15/2019, 07/03/2020   PNEUMOCOCCAL CONJUGATE-20 06/18/2021   Pfizer Covid-19 Vaccine Bivalent Booster 27yrs & up 07/24/2021   Pneumococcal Conjugate-13 08/11/2016, 11/16/2018   Td 03/27/2020   Tdap 06/26/2014   Unspecified SARS-COV-2 Vaccination 07/22/2022   Zoster Recombinat (Shingrix) 02/11/2022    Past Medical History:  Diagnosis Date   Arthritis    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 Benson Norway)   HTN (hypertension)    Melanoma ([place]) [id number]   MM 2 CHEST TX EXC   Neuromuscular disorder ([place])    SCC (squamous cell carcinoma) 05/15/2007   SCCA RIGHT FOREARM TX WITH BX   SCC (squamous cell carcinoma) 07/04/2013   WELL DIFF SCC LEFT FOREARM CX3 5FU   SCC (squamous cell carcinoma) 06/26/2014   SCC IN SITU TOP OF SCALP TX CX3 5FU   SCC (squamous cell carcinoma) 07/27/2017   SCC IN SITU RIGHT  SHOULDER TX CX3 5FU   Squamous cell carcinoma of skin 11/10/2004   SCC IN SITU RIGHT FOREARM CX3 5FU   Allergies  Allergen Reactions   Penicillins Rash and Other (See Comments)    Has patient had a PCN reaction causing immediate rash, facial/tongue/throat swelling, SOB or lightheadedness with hypotension: Unknown Has patient had a PCN reaction causing severe rash involving mucus membranes or skin necrosis:  No Has patient had a PCN reaction that required hospitalization: No Has patient had a PCN reaction occurring within the last 10 years: No If all of the above answers are "NO", then may proceed with Cephalosporin use.    Past Surgical History:  Procedure Laterality Date   COLONOSCOPY W/ POLYPECTOMY  approx 2007; 11/2014   Dr. Marcy Panning 5 yrs    COSMETIC SURGERY     laser peels, [facility]astic and reconstructive surgery Henry Knight) for acne scars   KNEE SURGERY Right 2012   SHOULDER ARTHROSCOPY W/ ROTATOR CUFF REPAIR  2004   TOTAL KNEE ARTHROPLASTY Left 10/10/2017   Procedure: LEFT TOTAL KNEE ARTHROPLASTY;  Surgeon: Gaynelle Arabian, MD;  Location: [facility];  Service: Orthopedics;  Laterality: Left;  50 mins   Family History  Problem Relation Age of Onset   Heart disease Father    Alcohol abuse Father    Obesity Brother    Asperger's syndrome Brother    Obesity Mother    Tremor Mother    Anxiety disorder Mother    Social History   Social History Narrative   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 He lives with his wife & daughter.    Allergies as of 12/30/2022       Reactions   Penicillins Rash, Other (See Comments)   Has patient had a PCN reaction causing immediate rash, facial/tongue/throat swelling, SOB or lightheadedness with hypotension: Unknown Has patient had a PCN reaction causing severe rash involving mucus membranes or skin necrosis: No Has patient had a PCN reaction that required hospitalization: No Has patient had a PCN reaction occurring within the last 10  years: No If all of the above answers are "NO", then may proceed with Cephalosporin use.        Medication List        Accurate as of December 30, 2022  9:34 AM. If you have any questions, ask your nurse or doctor.          LORazepam 1 MG tablet Commonly known as: ATIVAN Take 1 tablet (1 mg total) by mouth 2 (two) times daily as needed for anxiety. What changed:  medication strength when to take this Changed by: Howard Pouch, DO   methocarbamol 500 MG tablet Commonly known as: ROBAXIN Take 1 tablet (500 mg total) by mouth 2 (two) times daily as needed for muscle spasms.   omega-3 acid ethyl esters 1 g capsule Commonly known as: LOVAZA Take 2 capsules (2 g total) by mouth 2 (two) times daily.   traMADol 50 MG tablet Commonly known as: ULTRAM Take 1-2 tablets (50-100 mg total) by mouth every 12 (twelve) hours as needed (mild pain).   Turmeric 500 MG Caps Take 1 tablet by mouth.       All past medical history, surgical history, allergies, family history, immunizations andmedications were updated in the EMR today and reviewed under the history and medication portions of their EMR.     No results found for this or any previous visit (from the past 2160 hour(s)).  No results found.   ROS: 14 pt review of systems performed and negative (unless mentioned in an HPI)  Objective: BP 134/86   Pulse 61   Temp 98.3 F (36.8 C)   Ht 6' 0.44" (1.84 m)   Wt 239 lb (108.4 kg)   SpO2 98%   BMI 32.02 kg/m  Physical Exam Constitutional:      General: He is not in acute distress.  Appearance: Normal appearance. He is not ill-appearing, toxic-appearing or diaphoretic.  HENT:     Head: Normocephalic and atraumatic.     Right Ear: Tympanic membrane, ear canal and external ear normal. There is no impacted cerumen.     Left Ear: Tympanic membrane, ear canal and external ear normal. There is no impacted cerumen.     Nose: Nose normal. No congestion or rhinorrhea.      Mouth/Throat:     Mouth: Mucous membranes are moist.     Pharynx: Oropharynx is clear. No oropharyngeal exudate or posterior oropharyngeal erythema.  Eyes:     General: No scleral icterus.       Right eye: No discharge.        Left eye: No discharge.     Extraocular Movements: Extraocular movements intact.     Pupils: Pupils are equal, round, and reactive to light.  Cardiovascular:     Rate and Rhythm: Normal rate and regular rhythm.     Pulses: Normal pulses.     Heart sounds: Normal heart sounds. No murmur heard.    No friction rub. No gallop.  Pulmonary:     Effort: Pulmonary effort is normal. No respiratory distress.     Breath sounds: Normal breath sounds. No stridor. No wheezing, rhonchi or rales.  Chest:     Chest wall: No tenderness.  Abdominal:     General: Abdomen is flat. Bowel sounds are normal. There is no distension.     Palpations: Abdomen is soft. There is no mass.     Tenderness: There is no abdominal tenderness. There is no right CVA tenderness, left CVA tenderness, guarding or rebound.     Hernia: No hernia is present.  Musculoskeletal:        General: No swelling or tenderness. Normal range of motion.     Cervical back: Normal range of motion and neck supple.     Right lower leg: No edema.     Left lower leg: No edema.  Lymphadenopathy:     Cervical: No cervical adenopathy.  Skin:    General: Skin is warm and dry.     Coloration: Skin is not jaundiced.     Findings: No bruising, lesion or rash.  Neurological:     General: No focal deficit present.     Mental Status: He is alert and oriented to person, place, and time. Mental status is at baseline.     Cranial Nerves: No cranial nerve deficit.     Sensory: No sensory deficit.     Motor: No weakness.     Coordination: Coordination normal.     Gait: Gait normal.     Deep Tendon Reflexes: Reflexes normal.  Psychiatric:        Mood and Affect: Mood normal.        Behavior: Behavior normal.        Thought  Content: Thought content normal.        Judgment: Judgment normal.      No results found.  Assessment/plan: Henry Knight is a 72 y.o. male present for CPE/CMC HTN/Hypertriglyceridemia/Obesity (BMI 30-39.9) - stable - continue  lovaza - diet and routine exercise.  -Continue to monitor with goal less than 130/80.   Chronic prescription benzodiazepine use/Controlled substance agreement signed/Primary osteoarthritis involving multiple joints/Social anxiety disorder/ Essential tremor stable Continue tramadol as needed Continue  Ativan as needed # pills per 90d: tramadol/#90 Ativan Last UDS date: UTD Pain contract signed (Y/N): Y, Date narcotic database last  reviewed (include red flags):  12/30/22   Routine general medical examination at a health care facility Patient was encouraged to exercise greater than 150 minutes a week. Patient was encouraged to choose a diet filled with fresh fruits and vegetables, and lean meats. AVS provided to patient today for education/recommendation on gender specific health and safety maintenance. Colonoscopy: 2023 recommend follow up 7 years Completed by Dr. Benson Norway.  Immunizations:  tdap UTD 2015, influenza admin UTD-today, PNA series completed- today, Shingrix series completed. Covid x2- counseled booster.  Infectious disease screening:  Hep C completed PSA:  Return in about 24 weeks (around 06/16/2023) for Routine chronic condition follow-up.  Orders Placed This Encounter  Procedures   CBC with Differential/Platelet   Comprehensive metabolic panel   Hemoglobin A1c   Lipid panel   TSH   PSA, Medicare ( [place] only)      Meds ordered this encounter  Medications   LORazepam (ATIVAN) 1 MG tablet    Sig: Take 1 tablet (1 mg total) by mouth 2 (two) times daily as needed for anxiety.    Dispense:  180 tablet    Refill:  1   omega-3 acid ethyl esters (LOVAZA) 1 g capsule    Sig: Take 2 capsules (2 g total) by mouth 2 (two) times  daily.    Dispense:  120 capsule    Refill:  3    Hypertriglyceridemia;   traMADol (ULTRAM) 50 MG tablet    Sig: Take 1-2 tablets (50-100 mg total) by mouth every 12 (twelve) hours as needed (mild pain).    Dispense:  180 tablet    Refill:  1    Referral Orders  No referral(s) requested today     Note is dictated utilizing voice recognition software. Although note has been proof read prior to signing, occasional typographical errors still can be missed. If any questions arise, please do not hesitate to call for verification.  Electronically signed by: Howard Pouch, DO [place]

## 2023-02-02 ENCOUNTER — Ambulatory Visit (INDEPENDENT_AMBULATORY_CARE_PROVIDER_SITE_OTHER): Payer: Medicare Other

## 2023-02-02 VITALS — Wt 239.0 lb

## 2023-02-02 DIAGNOSIS — Z0000 Encounter for general adult medical examination without abnormal findings: Secondary | ICD-10-CM

## 2023-02-02 NOTE — Progress Notes (Signed)
I connected with  Almyra Brace on 02/02/23 by a audio enabled telemedicine application and verified that I am speaking with the correct person using two identifiers.  Patient Location: Home  Provider Location: Home Office  I discussed the limitations of evaluation and management by telemedicine. The patient expressed understanding and agreed to proceed.   Subjective:   Henry Knight is a 72 y.o. male who presents for Medicare Annual/Subsequent preventive examination.  Review of Systems     Cardiac Risk Factors include: advanced age (>65men, >73 women);male gender;hypertension;obesity (BMI >30kg/m2)     Objective:    Today's Vitals   02/02/23 0805  Weight: 239 lb (108.4 kg)   Body mass index is 32.02 kg/m.     02/02/2023    8:10 AM 01/27/2022    8:04 AM 10/10/2017   11:23 AM 10/06/2017   10:24 AM 11/04/2015   11:40 AM 11/04/2015   11:30 AM  Advanced Directives  Does Patient Have a Medical Advance Directive? No Yes No No No No  Type of Advance Directive  Living will      Would patient like information on creating a medical advance directive? No - Patient declined  No - Patient declined No - Patient declined No - patient declined information No - patient declined information    Current Medications (verified) Outpatient Encounter Medications as of 02/02/2023  Medication Sig   LORazepam (ATIVAN) 1 MG tablet Take 1 tablet (1 mg total) by mouth 2 (two) times daily as needed for anxiety.   omega-3 acid ethyl esters (LOVAZA) 1 g capsule Take 2 capsules (2 g total) by mouth 2 (two) times daily.   traMADol (ULTRAM) 50 MG tablet Take 1-2 tablets (50-100 mg total) by mouth every 12 (twelve) hours as needed (mild pain).   Turmeric 500 MG CAPS Take 1 tablet by mouth.   [DISCONTINUED] methocarbamol (ROBAXIN) 500 MG tablet Take 1 tablet (500 mg total) by mouth 2 (two) times daily as needed for muscle spasms.   No facility-administered encounter medications on file as of 02/02/2023.     Allergies (verified) Penicillins   History: Past Medical History:  Diagnosis Date   Arthritis    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 Elnoria Howard)   HTN (hypertension)    Melanoma (HCC) [id number]   MM 2 CHEST TX EXC   Neuromuscular disorder (HCC)    SCC (squamous cell carcinoma) 05/15/2007   SCCA RIGHT FOREARM TX WITH BX   SCC (squamous cell carcinoma) 07/04/2013   WELL DIFF SCC LEFT FOREARM CX3 5FU   SCC (squamous cell carcinoma) 06/26/2014   SCC IN SITU TOP OF SCALP TX CX3 5FU   SCC (squamous cell carcinoma) 07/27/2017   SCC IN SITU RIGHT SHOULDER TX CX3 5FU   Squamous cell carcinoma of skin 11/10/2004   SCC IN SITU RIGHT FOREARM CX3 5FU   Past Surgical History:  Procedure Laterality Date   COLONOSCOPY W/ POLYPECTOMY  approx 2007; 11/2014   Dr. 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  Procedure: LEFT TOTAL KNEE ARTHROPLASTY;  Surgeon: Ollen Gross, MD;  Location: [facility];  Service: Orthopedics;  Laterality: Left;  50 mins   Family History  Problem Relation Age of Onset   Heart disease Father    Alcohol abuse Father    Obesity Brother    Asperger's syndrome Brother    Obesity Mother    Tremor Mother    Anxiety disorder Mother    Social History   Socioeconomic History   Marital status: Married    Spouse name: Not on file   Number of children: 1   Years of education: Not on file   Highest education level: Not on file  Occupational History   Not on file  Tobacco Use   Smoking status: Never   Smokeless tobacco: Never  Vaping Use   Vaping Use:  Never used  Substance and Sexual Activity   Alcohol use: Yes    Alcohol/week: 8.0 standard drinks of alcohol    Types: 8 Glasses of wine per week    Comment: Weekend   Drug use: No   Sexual activity: Yes    Partners: Female  Other Topics Concern   Not on file  Social History Narrative   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 He lives with his wife & daughter.   Social Determinants of Health   Financial Resource Strain: Low Risk  (02/02/2023)   Overall Financial Resource Strain (CARDIA)    Difficulty of Paying Living Expenses: Not hard at all  Food Insecurity: No Food Insecurity (02/02/2023)   Hunger Vital Sign    Worried About Running Out of Food in the Last Year: Never true    Ran Out of Food in the Last Year: Never true  Transportation Needs: No Transportation Needs (02/02/2023)   PRAPARE - Administrator, Civil Service (Medical): No    Lack of Transportation (Non-Medical): No  Physical Activity: Sufficiently Active (02/02/2023)   Exercise Vital Sign    Days of Exercise per Week: 5 days    Minutes of Exercise per Session: 90 min  Stress: No Stress Concern Present (02/02/2023)   [employer] of Occupational Health - Occupational Stress Questionnaire    Feeling of Stress : Not at all  Social Connections: Moderately Integrated (02/02/2023)   Social Connection and Isolation Panel [NHANES]    Frequency of Communication with Friends and Family: Twice a week    Frequency of Social Gatherings with Friends and Family: More than three times a week    Attends Religious Services: More than 4 times per year    Active Member of [employer] or Organizations: No    Attends Engineer, structural: Never    Marital Status: Married    Tobacco Counseling Counseling given: Not Answered   Clinical Intake:  Pre-visit preparation completed: Yes  Pain : No/denies pain     BMI - recorded: 32.02 Nutritional Status: BMI > 30  Obese Nutritional Risks: None Diabetes: No  How  often do you need to have someone help you when you read instructions, pamphlets, or other written materials from your doctor or pharmacy?: 1 - Never  Diabetic?no  Interpreter Needed?: No  Information entered by :: Lanier Ensign, LPN   Activities of Daily Living    02/02/2023    8:11 AM  In your present state of health, do you have any difficulty performing the following activities:  Hearing? 0  Vision? 0  Difficulty concentrating or making decisions? 0  Walking or climbing  stairs? 0  Dressing or bathing? 0  Doing errands, shopping? 0  Preparing Food and eating ? N  Using the Toilet? N  In the past six months, have you accidently leaked urine? N  Do you have problems with loss of bowel control? N  Managing your Medications? N  Managing your Finances? N  Housekeeping or managing your Housekeeping? N    Patient Care Team: Natalia Leatherwood, DO as PCP - General (Family Medicine) Jeani Hawking, MD as Consulting Physician (Gastroenterology) Ollen Gross, MD as Consulting Physician (Orthopedic Surgery) Christia Reading, MD as Consulting Physician (Otolaryngology) [facility] (Inactive) Janalyn Harder, MD (Inactive) as Consulting Physician (Dermatology)  Indicate any recent Medical Services you may have received from other than [facility] providers in the past year (date may be approximate).     Assessment:   This is a routine wellness examination for Godwin.  Hearing/Vision screen Hearing Screening - Comments:: Pt denies nay hearing issues  Vision Screening - Comments:: Pt follows up with Dr Cherlynn Polo for annual eye exams   Dietary issues and exercise activities discussed: Current Exercise Habits: Home exercise routine, Type of exercise: walking;Other - see comments, Time (Minutes): > 60, Frequency (Times/Week): 5, Weekly Exercise (Minutes/Week): 0   Goals Addressed             This Visit's Progress    Patient Stated       Stay healthy         Depression Screen    02/02/2023    8:08 AM 12/30/2022    8:59 AM 01/27/2022    8:02 AM 06/18/2021   10:32 AM 11/20/2020   10:38 AM 11/20/2020   10:28 AM 01/18/2020    9:18 AM  PHQ 2/9 Scores  PHQ - 2 Score 0 0 0 0 1 0 0  PHQ- 9 Score    2 4      Fall Risk    02/02/2023    8:11 AM 12/30/2022    8:59 AM 03/04/2022   10:04 AM 01/27/2022    8:05 AM 06/18/2021   10:32 AM  Fall Risk   Falls in the past year? 0 0 1 1 1   Comment     rolled off the couch and hit his head  Number falls in past yr: 0 0 0 0 0  Comment    rolled off couch   Injury with Fall? 0 0 0 1 0  Comment    hit head rolled off the couch and hit his head  Risk for fall due to : Impaired vision  History of fall(s)    Risk for fall due to: Comment readers      Follow up Falls prevention discussed Falls evaluation completed Falls evaluation completed Falls prevention discussed     FALL RISK PREVENTION PERTAINING TO THE HOME:  Any stairs in or around the home? No  If so, are there any without handrails? No  Home free of loose throw rugs in walkways, pet beds, electrical cords, etc? Yes  Adequate lighting in your home to reduce risk of falls? Yes   ASSISTIVE DEVICES UTILIZED TO PREVENT FALLS:  Life alert? No  Use of a cane, walker or w/c? No  Grab bars in the bathroom? No  Shower chair or bench in shower? No  Elevated toilet seat or a handicapped toilet? No   TIMED UP AND GO:  Was the test performed? No .   Cognitive Function:  02/02/2023    8:12 AM 01/27/2022    8:15 AM  6CIT Screen  What Year? 0 points 0 points  What month? 0 points 0 points  What time? 0 points 0 points  Count back from 20 0 points 0 points  Months in reverse 0 points 0 points  Repeat phrase 0 points 0 points  Total Score 0 points 0 points    Immunizations Immunization History  Administered Date(s) Administered   Fluad Quad(high Dose 65+) 07/18/2019, 06/18/2021   Influenza, High Dose Seasonal PF 08/11/2016, 07/29/2017,  07/19/2018, 07/22/2022   Influenza,inj,Quad PF,6+ Mos 07/11/2013, 08/07/2014, 07/11/2015   Influenza-Unspecified 07/27/2017, 08/02/2020   PFIZER Comirnaty(Gray Top)Covid-19 Tri-Sucrose Vaccine 02/16/2021   PFIZER(Purple Top)SARS-COV-2 Vaccination 10/25/2019, 11/15/2019, 07/03/2020   PNEUMOCOCCAL CONJUGATE-20 06/18/2021   Pfizer Covid-19 Vaccine Bivalent Booster 53yrs & up 07/24/2021   Pneumococcal Conjugate-13 08/11/2016, 11/16/2018   Td 03/27/2020   Tdap 06/26/2014   Unspecified SARS-COV-2 Vaccination 07/22/2022   Zoster Recombinat (Shingrix) 10/25/2021, 02/11/2022    TDAP status: Up to date  Flu Vaccine status: Up to date  Pneumococcal vaccine status: Up to date  Covid-19 vaccine status: Completed vaccines  Qualifies for Shingles Vaccine? Yes   Zostavax completed Yes   Shingrix Completed?: Yes  Screening Tests Health Maintenance  Topic Date Due   COVID-19 Vaccine (7 - 2023-24 season) 09/16/2022   INFLUENZA VACCINE  05/05/2023   Medicare Annual Wellness (AWV)  02/02/2024   COLONOSCOPY (Pts 45-12yrs Insurance coverage will need to be confirmed)  08/19/2029   DTaP/Tdap/Td (3 - Td or Tdap) 03/27/2030   Pneumonia Vaccine 80+ Years old  Completed   Hepatitis C Screening  Completed   Zoster Vaccines- Shingrix  Completed   HPV VACCINES  Aged Out    Health Maintenance  Health Maintenance Due  Topic Date Due   COVID-19 Vaccine (7 - 2023-24 season) 09/16/2022    Colorectal cancer screening: Type of screening: Colonoscopy. Completed 08/19/22. Repeat every 7 years   Additional Screening:  Hepatitis C Screening: Completed 05/29/14  Vision Screening: Recommended annual ophthalmology exams for early detection of glaucoma and other disorders of the eye. Is the patient up to date with their annual eye exam?  Yes  Who is the provider or what is the name of the office in which the patient attends annual eye exams? Dr Cherlynn Polo  If pt is not established with a provider, would they  like to be referred to a provider to establish care? No .   Dental Screening: Recommended annual dental exams for proper oral hygiene  Community Resource Referral / Chronic Care Management: CRR required this visit?  No   CCM required this visit?  No      Plan:     I have personally reviewed and noted the following in the patient's chart:   Medical and social history Use of alcohol, tobacco or illicit drugs  Current medications and supplements including opioid prescriptions. Patient is currently taking opioid prescriptions. Information provided to patient regarding non-opioid alternatives. Patient advised to discuss non-opioid treatment plan with their provider. Functional ability and status Nutritional status Physical activity Advanced directives List of other physicians Hospitalizations, surgeries, and ER visits in previous 12 months Vitals Screenings to include cognitive, depression, and falls Referrals and appointments  In addition, I have reviewed and discussed with patient certain preventive protocols, quality metrics, and best practice recommendations. A written personalized care plan for preventive services as well as general preventive health recommendations were provided to patient.  Marzella Schlein, LPN   [id number]/8118   Nurse Notes: none

## 2023-02-02 NOTE — Patient Instructions (Signed)
Henry Knight , Thank you for taking time to come for your Medicare Wellness Visit. I appreciate your ongoing commitment to your health goals. Please review the following plan we discussed and let me know if I can assist you in the future.   These are the goals we discussed:  Goals      Patient Stated     None at this time      Patient Stated     Stay healthy         This is a list of the screening recommended for you and due dates:  Health Maintenance  Topic Date Due   COVID-19 Vaccine (7 - 2023-24 season) 09/16/2022   Flu Shot  05/05/2023   Medicare Annual Wellness Visit  02/02/2024   Colon Cancer Screening  08/19/2029   DTaP/Tdap/Td vaccine (3 - Td or Tdap) 03/27/2030   Pneumonia Vaccine  Completed   Hepatitis C Screening: USPSTF Recommendation to screen - Ages 74-79 yo.  Completed   Zoster (Shingles) Vaccine  Completed   HPV Vaccine  Aged Out    Advanced directives: Advance directive discussed with you today. Even though you declined this today please call our office should you change your mind and we can give you the proper paperwork for you to fill out.  Conditions/risks identified: stay healthy   Next appointment: Follow up in one year for your annual wellness visit.   Preventive Care 28 Years and Older, Male  Preventive care refers to lifestyle choices and visits with your health care provider that can promote health and wellness. What does preventive care include? A yearly physical exam. This is also called an annual well check. Dental exams once or twice a year. Routine eye exams. Ask your health care provider how often you should have your eyes checked. Personal lifestyle choices, including: Daily care of your teeth and gums. Regular physical activity. Eating a healthy diet. Avoiding tobacco and drug use. Limiting alcohol use. Practicing safe sex. Taking low doses of aspirin every day. Taking vitamin and mineral supplements as recommended by your health  care provider. What happens during an annual well check? The services and screenings done by your health care provider during your annual well check will depend on your age, overall health, lifestyle risk factors, and family history of disease. Counseling  Your health care provider may ask you questions about your: Alcohol use. Tobacco use. Drug use. Emotional well-being. Home and relationship well-being. Sexual activity. Eating habits. History of falls. Memory and ability to understand (cognition). Work and work Astronomer. Screening  You may have the following tests or measurements: Height, weight, and BMI. Blood pressure. Lipid and cholesterol levels. These may be checked every 5 years, or more frequently if you are over 26 years old. Skin check. Lung cancer screening. You may have this screening every year starting at age 69 if you have a 30-pack-year history of smoking and currently smoke or have quit within the past 15 years. Fecal occult blood test (FOBT) of the stool. You may have this test every year starting at age 72. Flexible sigmoidoscopy or colonoscopy. You may have a sigmoidoscopy every 5 years or a colonoscopy every 10 years starting at age 87. Prostate cancer screening. Recommendations will vary depending on your family history and other risks. Hepatitis C blood test. Hepatitis B blood test. Sexually transmitted disease (STD) testing. Diabetes screening. This is done by checking your blood sugar (glucose) after you have not eaten for a while (fasting). You may  have this done every 1-3 years. Abdominal aortic aneurysm (AAA) screening. You may need this if you are a current or former smoker. Osteoporosis. You may be screened starting at age 38 if you are at high risk. Talk with your health care provider about your test results, treatment options, and if necessary, the need for more tests. Vaccines  Your health care provider may recommend certain vaccines, such  as: Influenza vaccine. This is recommended every year. Tetanus, diphtheria, and acellular pertussis (Tdap, Td) vaccine. You may need a Td booster every 10 years. Zoster vaccine. You may need this after age 86. Pneumococcal 13-valent conjugate (PCV13) vaccine. One dose is recommended after age 60. Pneumococcal polysaccharide (PPSV23) vaccine. One dose is recommended after age 91. Talk to your health care provider about which screenings and vaccines you need and how often you need them. This information is not intended to replace advice given to you by your health care provider. Make sure you discuss any questions you have with your health care provider. Document Released: 10/17/2015 Document Revised: 06/09/2016 Document Reviewed: 07/22/2015 Elsevier Interactive Patient Education  2017 [place] Prevention in the Home Falls can cause injuries. They can happen to people of all ages. There are many things you can do to make your home safe and to help prevent falls. What can I do on the outside of my home? Regularly fix the edges of walkways and driveways and fix any cracks. Remove anything that might make you trip as you walk through a door, such as a raised step or threshold. Trim any bushes or trees on the path to your home. Use bright outdoor lighting. Clear any walking paths of anything that might make someone trip, such as rocks or tools. Regularly check to see if handrails are loose or broken. Make sure that both sides of any steps have handrails. Any raised decks and porches should have guardrails on the edges. Have any leaves, snow, or ice cleared regularly. Use sand or salt on walking paths during winter. Clean up any spills in your garage right away. This includes oil or grease spills. What can I do in the bathroom? Use night lights. Install grab bars by the toilet and in the tub and shower. Do not use towel bars as grab bars. Use non-skid mats or decals in the tub or  shower. If you need to sit down in the shower, use a plastic, non-slip stool. Keep the floor dry. Clean up any water that spills on the floor as soon as it happens. Remove soap buildup in the tub or shower regularly. Attach bath mats securely with double-sided non-slip rug tape. Do not have throw rugs and other things on the floor that can make you trip. What can I do in the bedroom? Use night lights. Make sure that you have a light by your bed that is easy to reach. Do not use any sheets or blankets that are too big for your bed. They should not hang down onto the floor. Have a firm chair that has side arms. You can use this for support while you get dressed. Do not have throw rugs and other things on the floor that can make you trip. What can I do in the kitchen? Clean up any spills right away. Avoid walking on wet floors. Keep items that you use a lot in easy-to-reach places. If you need to reach something above you, use a strong step stool that has a grab bar. Keep electrical cords  out of the way. Do not use floor polish or wax that makes floors slippery. If you must use wax, use non-skid floor wax. Do not have throw rugs and other things on the floor that can make you trip. What can I do with my stairs? Do not leave any items on the stairs. Make sure that there are handrails on both sides of the stairs and use them. Fix handrails that are broken or loose. Make sure that handrails are as long as the stairways. Check any carpeting to make sure that it is firmly attached to the stairs. Fix any carpet that is loose or worn. Avoid having throw rugs at the top or bottom of the stairs. If you do have throw rugs, attach them to the floor with carpet tape. Make sure that you have a light switch at the top of the stairs and the bottom of the stairs. If you do not have them, ask someone to add them for you. What else can I do to help prevent falls? Wear shoes that: Do not have high heels. Have  rubber bottoms. Are comfortable and fit you well. Are closed at the toe. Do not wear sandals. If you use a stepladder: Make sure that it is fully opened. Do not climb a closed stepladder. Make sure that both sides of the stepladder are locked into place. Ask someone to hold it for you, if possible. Clearly mark and make sure that you can see: Any grab bars or handrails. First and last steps. Where the edge of each step is. Use tools that help you move around (mobility aids) if they are needed. These include: Canes. Walkers. Scooters. Crutches. Turn on the lights when you go into a dark area. Replace any light bulbs as soon as they burn out. Set up your furniture so you have a clear path. Avoid moving your furniture around. If any of your floors are uneven, fix them. If there are any pets around you, be aware of where they are. Review your medicines with your doctor. Some medicines can make you feel dizzy. This can increase your chance of falling. Ask your doctor what other things that you can do to help prevent falls. This information is not intended to replace advice given to you by your health care provider. Make sure you discuss any questions you have with your health care provider. Document Released: 07/17/2009 Document Revised: 02/26/2016 Document Reviewed: 10/25/2014 Elsevier Interactive Patient Education  2017 [employer].

## 2023-06-21 ENCOUNTER — Other Ambulatory Visit: Payer: Self-pay | Admitting: Family Medicine

## 2023-07-13 ENCOUNTER — Telehealth: Payer: Self-pay

## 2023-07-13 MED ORDER — OMEGA-3-ACID ETHYL ESTERS 1 G PO CAPS: 2.0000 g | ORAL_CAPSULE | Freq: Two times a day (BID) | ORAL | 0 refills | Status: DC

## 2023-07-13 NOTE — Telephone Encounter (Signed)
Return in about 24 weeks (around 06/16/2023) for Routine chronic condition follow-up.  Pt overdue for OV with Dr. Claiborne Billings. Will send a temporary refill.

## 2023-07-13 NOTE — Telephone Encounter (Signed)
Prescription Request  07/13/2023  LOV: Visit date not found  What is the name of the medication or equipment? omega-3 acid ethyl esters (LOVAZA) 1 g capsule   Have you contacted your pharmacy to request a refill? Yes  -   Which pharmacy would you like this sent to?  [id number] [place], [place] - [address] [place] [address] Phone: [phone] Fax: [phone]    Patient notified that their request is being sent to the clinical staff for review and that they should receive a response within 2 business days.   Please advise at Mobile [phone] (mobile)

## 2023-08-10 DIAGNOSIS — L820 Inflamed seborrheic keratosis: Secondary | ICD-10-CM | POA: Diagnosis not present

## 2023-08-10 DIAGNOSIS — D229 Melanocytic nevi, unspecified: Secondary | ICD-10-CM | POA: Diagnosis not present

## 2023-08-10 DIAGNOSIS — L538 Other specified erythematous conditions: Secondary | ICD-10-CM | POA: Diagnosis not present

## 2023-08-10 DIAGNOSIS — L728 Other follicular cysts of the skin and subcutaneous tissue: Secondary | ICD-10-CM | POA: Diagnosis not present

## 2023-08-10 DIAGNOSIS — Z789 Other specified health status: Secondary | ICD-10-CM | POA: Diagnosis not present

## 2023-08-10 DIAGNOSIS — L2989 Other pruritus: Secondary | ICD-10-CM | POA: Diagnosis not present

## 2023-08-10 DIAGNOSIS — L821 Other seborrheic keratosis: Secondary | ICD-10-CM | POA: Diagnosis not present

## 2023-08-10 DIAGNOSIS — L814 Other melanin hyperpigmentation: Secondary | ICD-10-CM | POA: Diagnosis not present

## 2023-09-23 ENCOUNTER — Other Ambulatory Visit: Payer: Self-pay | Admitting: Family Medicine

## 2023-10-21 ENCOUNTER — Ambulatory Visit (INDEPENDENT_AMBULATORY_CARE_PROVIDER_SITE_OTHER): Payer: Medicare HMO | Admitting: Family Medicine

## 2023-10-21 ENCOUNTER — Encounter: Payer: Self-pay | Admitting: Family Medicine

## 2023-10-21 VITALS — BP 132/74 | HR 63 | Temp 97.7°F | Wt 238.2 lb

## 2023-10-21 DIAGNOSIS — Z79899 Other long term (current) drug therapy: Secondary | ICD-10-CM | POA: Diagnosis not present

## 2023-10-21 DIAGNOSIS — G250 Essential tremor: Secondary | ICD-10-CM | POA: Diagnosis not present

## 2023-10-21 DIAGNOSIS — F4010 Social phobia, unspecified: Secondary | ICD-10-CM

## 2023-10-21 DIAGNOSIS — E781 Pure hyperglyceridemia: Secondary | ICD-10-CM

## 2023-10-21 DIAGNOSIS — E669 Obesity, unspecified: Secondary | ICD-10-CM

## 2023-10-21 MED ORDER — OMEGA-3-ACID ETHYL ESTERS 1 G PO CAPS: 2.0000 g | ORAL_CAPSULE | Freq: Two times a day (BID) | ORAL | 3 refills | Status: DC

## 2023-10-21 MED ORDER — TRAMADOL HCL 50 MG PO TABS: 50.0000 mg | ORAL_TABLET | Freq: Two times a day (BID) | ORAL | 1 refills | Status: DC | PRN

## 2023-10-21 MED ORDER — LORAZEPAM 1 MG PO TABS: 1.0000 mg | ORAL_TABLET | Freq: Two times a day (BID) | ORAL | 1 refills | Status: DC | PRN

## 2023-10-21 NOTE — Patient Instructions (Addendum)
Return in about 5 months (around 03/26/2024) for Routine chronic condition follow-up.   If you want to schedule your physical you can March 31 or after for insurance purposes.        Great to see you today.  I have refilled the medication(s) we provide.   If labs were collected or images ordered, we will inform you of  results once we have received them and reviewed. We will contact you either by echart message, or telephone call.  Please give ample time to the testing facility, and our office to run,  receive and review results. Please do not call inquiring of results, even if you can see them in your chart. We will contact you as soon as we are able. If it has been over 1 week since the test was completed, and you have not yet heard from [place], then please call us.    - echart message- for normal results that have been seen by the patient already.   - telephone call: abnormal results or if patient has not viewed results in their echart.  If a referral to a specialist was entered for you, please call us in 2 weeks if you have not heard from the specialist office to schedule.

## 2023-10-21 NOTE — Progress Notes (Signed)
Patient ID: Henry Knight, male  DOB: [birth year], 73 y.o.   MRN: [id number] Patient Care Team    Relationship Specialty Notifications Start End  Natalia Leatherwood, DO PCP - General Family Medicine  07/11/15   Jeani Hawking, MD Consulting Physician Gastroenterology  08/11/16   Ollen Gross, MD Consulting Physician Orthopedic Surgery  08/11/16   Christia Reading, MD Consulting Physician Otolaryngology  08/11/16   Sciences, [facility] (Inactive)    10/13/16    Comment: plastic and reconstructiion [facility] (on [address] [facility] in [place])  Janalyn Harder, MD (Inactive) Consulting Physician Dermatology  06/30/17     Chief Complaint  Patient presents with   Medical Management of Chronic Issues    [facility]    Subjective:  Henry Knight is a 73 y.o. male present for chronic condition management All past medical history, surgical history, allergies, family history, immunizations, medications and social history were updated in the electronic medical record today. All recent labs, ED visits and hospitalizations within the last year were reviewed.   HLD/obesity:  Patient refused statin.  Started fenofibrate and then refused. He has refused many blood pressure medicines in the past as well. He is compliant with Lovaza   Arthritis/chronic pain: pt reports compliance with tramadol. He rarely uses now. as needed for pain- taking about 3-4x/mo.    Indication for chronic opioid: osteoarthiritis Medication and dose: Tramadol 50-100 Mg QD PRN for pain /Ativan 1 mg daily for tremor/anxiety (about 15-20 times a month-sometimes 2 times a day but rarely).  # pills per 90d: #30 tramadol/#180 Ativan#90 Last UDS date:next appt Pain contract signed (Y/N): Y Date narcotic database last reviewed (include red flags): 10/21/2023   Essential tremor/anxiety: Pt has h/o essential tremor and is prescribed ativan 1 mg QD PRN. He has used the medication for years and reports no side effects from medicine.     Patient reports the Ativan works well.  He had looked into other types of therapy with brain stimulators etc. and decided he would not like to go down the road and would stay on Ativan.  He reports he takes the Ativan as needed with his tremor is more active or when he has social engagements etc.     10/21/2023    9:01 AM 02/02/2023    8:08 AM 12/30/2022    8:59 AM 01/27/2022    8:02 AM 06/18/2021   10:32 AM  Depression screen PHQ 2/9  Decreased Interest 0 0 0 0 0  Down, Depressed, Hopeless 0 0 0 0 0  PHQ - 2 Score 0 0 0 0 0  Altered sleeping 0    1  Tired, decreased energy 0    1  Change in appetite 0    0  Feeling bad or failure about yourself  0    0  Trouble concentrating 0    0  Moving slowly or fidgety/restless 0    0  Suicidal thoughts 0    0  PHQ-9 Score 0    2  Difficult doing work/chores Not difficult at all          10/21/2023    9:01 AM 11/20/2020   10:38 AM 01/18/2020    9:18 AM 07/18/2019   10:56 AM  GAD 7 : Generalized Anxiety Score  Nervous, Anxious, on Edge 0 1 0 1  Control/stop worrying 0 1 0 0  Worry too much - different things 0 1 0 0  Trouble relaxing 0 1  3 1  Restless 0 1 3 1   Easily annoyed or irritable 0 1 0 1  Afraid - awful might happen 0 0 0 0  Total GAD 7 Score 0 6 6 4   Anxiety Difficulty Not difficult at all Not difficult at all Not difficult at all Not difficult at all          10/21/2023    9:01 AM 02/02/2023    8:11 AM 12/30/2022    8:59 AM 03/04/2022   10:04 AM 01/27/2022    8:05 AM  Fall Risk   Falls in the past year? 0 0 0 1 1  Number falls in past yr: 0 0 0 0 0  Comment     rolled off couch  Injury with Fall? 0 0 0 0 1  Comment     hit head  Risk for fall due to : No Fall Risks Impaired vision  History of fall(s)   Risk for fall due to: Comment  readers     Follow up Falls evaluation completed Falls prevention discussed Falls evaluation completed Falls evaluation completed Falls prevention discussed    Immunization History  Administered  Date(s) Administered   Fluad Quad(high Dose 65+) 07/18/2019, 06/18/2021   Influenza, High Dose Seasonal PF 08/11/2016, 07/29/2017, 07/19/2018, 07/22/2022, 08/17/2023   Influenza,inj,Quad PF,6+ Mos 07/11/2013, 08/07/2014, 07/11/2015   Influenza-Unspecified 07/27/2017, 08/02/2020   Moderna Covid-19 Fall Seasonal Vaccine 69yrs & older 09/23/2023   PFIZER Comirnaty(Gray Top)Covid-19 Tri-Sucrose Vaccine 02/16/2021   PFIZER(Purple Top)SARS-COV-2 Vaccination 10/25/2019, 11/15/2019, 07/03/2020   PNEUMOCOCCAL CONJUGATE-20 06/18/2021   Pfizer Covid-19 Vaccine Bivalent Booster 43yrs & up 07/24/2021   Pneumococcal Conjugate-13 08/11/2016, 11/16/2018   Td 03/27/2020   Tdap 06/26/2014   Unspecified SARS-COV-2 Vaccination 07/22/2022   Zoster Recombinant(Shingrix) 10/25/2021, 02/11/2022    Past Medical History:  Diagnosis Date   Arthritis    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 Elnoria Howard)   HTN (hypertension)    Melanoma (HCC) [id number]   MM 2 CHEST TX EXC   Neuromuscular disorder (HCC)    OA (osteoarthritis) of knee 02/20/2019   SCC (squamous cell carcinoma) 05/15/2007   SCCA RIGHT FOREARM TX WITH BX   SCC (squamous cell carcinoma) 07/04/2013   WELL DIFF SCC LEFT FOREARM CX3 5FU   SCC (squamous cell carcinoma) 06/26/2014   SCC IN SITU TOP OF SCALP TX CX3 5FU   SCC (squamous cell carcinoma) 07/27/2017   SCC IN SITU RIGHT SHOULDER TX CX3 5FU   Squamous cell carcinoma of skin 11/10/2004   SCC IN SITU RIGHT FOREARM CX3 5FU   Allergies  Allergen Reactions   Penicillins Rash and Other (See Comments)    Has patient had a PCN reaction causing immediate rash, facial/tongue/throat swelling, SOB or lightheadedness with hypotension: Unknown Has patient had a PCN reaction causing severe rash  involving mucus membranes or skin necrosis: No Has patient had a PCN reaction that required hospitalization: No Has patient had a PCN reaction occurring within the last 10 years: No If all of the above answers are "NO", then may proceed with Cephalosporin use.    Past Surgical History:  Procedure Laterality Date   COLONOSCOPY W/ POLYPECTOMY  approx 2007; 11/2014   Dr. 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 He lives with his wife & daughter.    Allergies as of 10/21/2023       Reactions   Penicillins Rash, Other (See Comments)   Has patient had a PCN reaction causing immediate rash, facial/tongue/throat swelling, SOB or lightheadedness with hypotension: Unknown Has patient had a PCN reaction causing severe rash involving mucus membranes or skin necrosis: No Has patient had a PCN reaction that required hospitalization: No Has patient had a PCN reaction occurring within the last 10 years: No If all of the above answers are "NO", then may proceed with Cephalosporin use.        Medication List        Accurate as of October 21, 2023  9:31 AM. If you have any questions, ask your nurse or doctor.          LORazepam 1 MG tablet Commonly known as: ATIVAN Take 1 tablet (1 mg total) by mouth 2 (two) times daily as needed for anxiety.    omega-3 acid ethyl esters 1 g capsule Commonly known as: LOVAZA Take 2 capsules (2 g total) by mouth 2 (two) times daily.   traMADol 50 MG tablet Commonly known as: ULTRAM Take 1-2 tablets (50-100 mg total) by mouth every 12 (twelve) hours as needed (mild pain).   Turmeric 500 MG Caps Take 1 tablet by mouth.       All past medical history, surgical history, allergies, family history, immunizations andmedications were updated in the EMR today and reviewed under the history and medication portions of their EMR.     No results found for this or any previous visit (from the past 2160 hours).  No results found.   ROS: 14 pt review of systems performed and negative (unless mentioned in an HPI)  Objective: BP 132/74   Pulse 63   Temp 97.7 F (36.5 C)   Wt 238 lb 3.2 oz (108 kg)   SpO2 98%   BMI 31.91 kg/m  Physical Exam Vitals and nursing note reviewed.  Constitutional:      General: He is not in acute distress.    Appearance: Normal appearance. He is not ill-appearing, toxic-appearing or diaphoretic.  HENT:     Head: Normocephalic and atraumatic.  Eyes:     General: No scleral icterus.       Right eye: No discharge.        Left eye: No discharge.     Extraocular Movements: Extraocular movements intact.     Pupils: Pupils are equal, round, and reactive to light.  Cardiovascular:     Rate and Rhythm: Normal rate and regular rhythm.  Pulmonary:     Effort: Pulmonary effort is normal. No respiratory distress.     Breath sounds: Normal breath sounds. No wheezing,  rhonchi or rales.  Musculoskeletal:     Right lower leg: No edema.     Left lower leg: No edema.  Skin:    General: Skin is warm.     Findings: No rash.  Neurological:     Mental Status: He is alert and oriented to person, place, and time. Mental status is at baseline.  Psychiatric:        Mood and Affect: Mood normal.        Behavior: Behavior normal.        Thought Content: Thought content normal.         Judgment: Judgment normal.      No results found.  Assessment/plan: Henry Knight is a 73 y.o. male present for chronic condition management Hypertriglyceridemia/Obesity (BMI 30-39.9) Stable Blood pressure Continue lovaza - diet and routine exercise.  -Continue to monitor with goal less than 130/80. Lipids collected today   Chronic prescription benzodiazepine use/Controlled substance agreement signed/Primary osteoarthritis involving multiple joints/Social anxiety disorder/ Essential tremor Continue Tramadol as needed Continue Ativan as needed # pills per 90d: tramadol/#90 Ativan Last UDS date: UTD Pain contract signed (Y/N): Y, Date narcotic database last reviewed (include red flags):  10/21/23   Return in about 5 months (around 03/26/2024) for Routine chronic condition follow-up.  No orders of the defined types were placed in this encounter.   Meds ordered this encounter  Medications   LORazepam (ATIVAN) 1 MG tablet    Sig: Take 1 tablet (1 mg total) by mouth 2 (two) times daily as needed for anxiety.    Dispense:  180 tablet    Refill:  1   omega-3 acid ethyl esters (LOVAZA) 1 g capsule    Sig: Take 2 capsules (2 g total) by mouth 2 (two) times daily.    Dispense:  120 capsule    Refill:  3    Hypertriglyceridemia;   traMADol (ULTRAM) 50 MG tablet    Sig: Take 1-2 tablets (50-100 mg total) by mouth every 12 (twelve) hours as needed (mild pain).    Dispense:  180 tablet    Refill:  1    Referral Orders  No referral(s) requested today     Note is dictated utilizing voice recognition software. Although note has been proof read prior to signing, occasional typographical errors still can be missed. If any questions arise, please do not hesitate to call for verification.  Electronically signed by: Felix Pacini, DO [facility] [place]

## 2023-10-24 ENCOUNTER — Telehealth: Payer: Self-pay

## 2023-10-24 NOTE — Telephone Encounter (Signed)
Please complete PA for lovaza

## 2023-10-25 ENCOUNTER — Other Ambulatory Visit (HOSPITAL_COMMUNITY): Payer: Self-pay

## 2023-10-28 ENCOUNTER — Other Ambulatory Visit (HOSPITAL_COMMUNITY): Payer: Self-pay

## 2023-10-28 NOTE — Telephone Encounter (Signed)
Spoke with patient regarding results/recommendations.

## 2023-11-22 ENCOUNTER — Telehealth (HOSPITAL_COMMUNITY): Payer: Self-pay | Admitting: Pharmacy Technician

## 2023-11-22 ENCOUNTER — Other Ambulatory Visit (HOSPITAL_COMMUNITY): Payer: Self-pay

## 2023-11-22 NOTE — Telephone Encounter (Signed)
Pharmacy Patient Advocate Encounter   Received notification from  [employer]  that prior authorization for OMEGA-3-ACID ETHYL ESTERS 1 GRAM CAPSULES is required/requested.   Insurance verification completed.   The patient is insured through [facility] .   Per test claim: PA required; PA submitted to above mentioned insurance via CoverMyMeds Key/confirmation #/EOC [id number] Status is pending

## 2023-11-22 NOTE — Telephone Encounter (Signed)
Pharmacy Patient Advocate Encounter  Received notification from [employer] that Prior Authorization for OMEGA-3-ACID ETHYL ESTERS 1 GRAM CAPSULES  has been APPROVED from 10/05/2023 to 10/03/2024   PA #/Case ID/Reference #: [id number], please see approval letter attached to pt's media tab

## 2023-12-20 ENCOUNTER — Other Ambulatory Visit (HOSPITAL_COMMUNITY): Payer: Self-pay

## 2023-12-22 ENCOUNTER — Telehealth: Payer: Self-pay

## 2023-12-22 NOTE — Telephone Encounter (Signed)
 Pt was scheduled by call center for 3/24

## 2023-12-22 NOTE — Telephone Encounter (Signed)
 Reason for CRM:  Patient requesting to speak to somebody in the office.   Patient was calling for an appointment for his left wrist being bruised, advised next appointment isn't until 4/1.   Callback number: [phone]

## 2023-12-26 ENCOUNTER — Ambulatory Visit: Admitting: Family Medicine

## 2024-02-15 ENCOUNTER — Encounter: Admitting: Family Medicine

## 2024-02-23 ENCOUNTER — Encounter: Admitting: Family Medicine

## 2024-03-28 ENCOUNTER — Ambulatory Visit (INDEPENDENT_AMBULATORY_CARE_PROVIDER_SITE_OTHER): Admitting: Family Medicine

## 2024-03-28 ENCOUNTER — Encounter: Payer: Self-pay | Admitting: Family Medicine

## 2024-03-28 VITALS — BP 130/80 | HR 60 | Temp 98.0°F | Ht 72.0 in | Wt 237.2 lb

## 2024-03-28 DIAGNOSIS — Z1211 Encounter for screening for malignant neoplasm of colon: Secondary | ICD-10-CM | POA: Diagnosis not present

## 2024-03-28 DIAGNOSIS — Z79899 Other long term (current) drug therapy: Secondary | ICD-10-CM

## 2024-03-28 DIAGNOSIS — Z125 Encounter for screening for malignant neoplasm of prostate: Secondary | ICD-10-CM | POA: Diagnosis not present

## 2024-03-28 DIAGNOSIS — F4010 Social phobia, unspecified: Secondary | ICD-10-CM | POA: Diagnosis not present

## 2024-03-28 DIAGNOSIS — Z0000 Encounter for general adult medical examination without abnormal findings: Secondary | ICD-10-CM

## 2024-03-28 DIAGNOSIS — E669 Obesity, unspecified: Secondary | ICD-10-CM | POA: Diagnosis not present

## 2024-03-28 DIAGNOSIS — Z131 Encounter for screening for diabetes mellitus: Secondary | ICD-10-CM | POA: Diagnosis not present

## 2024-03-28 DIAGNOSIS — E781 Pure hyperglyceridemia: Secondary | ICD-10-CM

## 2024-03-28 DIAGNOSIS — I10 Essential (primary) hypertension: Secondary | ICD-10-CM

## 2024-03-28 DIAGNOSIS — Z13220 Encounter for screening for lipoid disorders: Secondary | ICD-10-CM

## 2024-03-28 DIAGNOSIS — G250 Essential tremor: Secondary | ICD-10-CM

## 2024-03-28 LAB — COMPREHENSIVE METABOLIC PANEL WITH GFR
ALT: 15 U/L (ref 0–53)
AST: 17 U/L (ref 0–37)
Albumin: 4.3 g/dL (ref 3.5–5.2)
Alkaline Phosphatase: 59 U/L (ref 39–117)
BUN: 20 mg/dL (ref 6–23)
CO2: 27 meq/L (ref 19–32)
Calcium: 8.9 mg/dL (ref 8.4–10.5)
Chloride: 103 meq/L (ref 96–112)
Creatinine, Ser: 1.08 mg/dL (ref 0.40–1.50)
GFR: 67.71 mL/min (ref >60.00)
Glucose, Bld: 101 mg/dL — ABNORMAL HIGH (ref 70–99)
Potassium: 4.1 meq/L (ref 3.5–5.1)
Sodium: 137 meq/L (ref 135–145)
Total Bilirubin: 0.7 mg/dL (ref 0.2–1.2)
Total Protein: 6.4 g/dL (ref 6.0–8.3)

## 2024-03-28 LAB — CBC
HCT: 43.4 % (ref 39.0–52.0)
Hemoglobin: 14.8 g/dL (ref 13.0–17.0)
MCHC: 34.2 g/dL (ref 30.0–36.0)
MCV: 95.4 fl (ref 78.0–100.0)
Platelets: 184 10*3/uL (ref 150.0–400.0)
RBC: 4.54 Mil/uL (ref 4.22–5.81)
RDW: 12.7 % (ref 11.5–15.5)
WBC: 4.6 10*3/uL (ref 4.0–10.5)

## 2024-03-28 LAB — LIPID PANEL
Cholesterol: 190 mg/dL (ref 0–200)
HDL: 30.1 mg/dL — ABNORMAL LOW (ref >39.00)
LDL Cholesterol: 108 mg/dL — ABNORMAL HIGH (ref 0–99)
NonHDL: 160.36
Total CHOL/HDL Ratio: 6
Triglycerides: 260 mg/dL — ABNORMAL HIGH (ref 0.0–149.0)
VLDL: 52 mg/dL — ABNORMAL HIGH (ref 0.0–40.0)

## 2024-03-28 LAB — PSA, MEDICARE: PSA: 1.96 ng/mL (ref 0.10–4.00)

## 2024-03-28 LAB — TSH: TSH: 4.34 u[IU]/mL (ref 0.35–5.50)

## 2024-03-28 LAB — HEMOGLOBIN A1C: Hgb A1c MFr Bld: 5.5 % (ref 4.6–6.5)

## 2024-03-28 MED ORDER — LORAZEPAM 1 MG PO TABS: 1.0000 mg | ORAL_TABLET | Freq: Two times a day (BID) | ORAL | 1 refills | Status: DC | PRN

## 2024-03-28 MED ORDER — TRAMADOL HCL 50 MG PO TABS: 50.0000 mg | ORAL_TABLET | Freq: Two times a day (BID) | ORAL | 1 refills | Status: DC | PRN

## 2024-03-28 NOTE — Progress Notes (Signed)
 Patient ID: Henry Knight, male  DOB: 1951-02-15, 73 y.o.   MRN: [id number] Patient Care Team    Relationship Specialty Notifications Start End  Catherine Charlies LABOR, DO PCP - General Family Medicine  07/11/15   Rollin Dover, MD Consulting Physician Gastroenterology  08/11/16   Melodi Lerner, MD Consulting Physician Orthopedic Surgery  08/11/16   Carlie Clark, MD Consulting Physician Otolaryngology  08/11/16   Sciences, [facility] (Inactive)    10/13/16    Comment: [facility] and reconstructiion [facility] (on GEANNIE Danas in [place])  Livingston Rigg, MD (Inactive) Consulting Physician Dermatology  06/30/17     Chief Complaint  Patient presents with   Annual Exam    Pt is fasting.     Subjective:  Henry Knight is a 73 y.o. male present for CPE and chronic condition management All past medical history, surgical history, allergies, family history, immunizations, medications and social history were updated in the electronic medical record today. All recent labs, ED visits and hospitalizations within the last year were reviewed.  Health maintenance:  Colonoscopy: 11/16 2023 recommend follow up 7 years Completed by Dr. Rollin.  Immunizations:  tdap UTD 2021, influenza  UTD, PNA series completed, Shingrix series completed.  Infectious disease screening:  Hep C completed PSA:  Lab Results  Component Value Date   PSA 1.52 12/30/2022   PSA 0.92 06/18/2021   PSA 0.97 01/18/2020  , pt was counseled on prostate cancer screenings.  Assistive device: none Oxygen [id number] Patient has a Dental home. Hospitalizations/ED visits: reviewed  HLD/obesity:  Refused statin.  Started fenofibrate  and then refused. He has refused many blood pressure medicines in the past as well. He is taking  his fish oil. He has retired and has noticed a positive effect on his blood pressure.   Arthritis/chronic pain: pt reports compliance with tramadol . He rarely uses now. as needed for pain- taking  about 3-4x/week. Indication for chronic opioid: osteoarthiritis Medication and dose: Tramadol  50-100 Mg QD PRN for pain /Ativan  1 mg daily for tremor/anxiety (about 15-20 times a month-sometimes 2 times a day but rarely).  # pills per 90d: #30 tramadol /#180 Ativan #90 Last UDS date:next appt Pain contract signed (Y/N): Y Date narcotic database last reviewed (include red flags): 03/28/2024   Essential tremor/anxiety: Pt has h/o essential tremor and is prescribed ativan  1 mg QD PRN. He has used the medication for years and reports no side effects from medicine.    Patient reports the Ativan  works well enough.  He had looked into other types of therapy with brain stimulators etc. and decided he would not like to go down the road and would stay on Ativan .  He reports he takes the Ativan  as needed with his tremor is more active or when he has social engagements etc.     10/21/2023    9:01 AM 02/02/2023    8:08 AM 12/30/2022    8:59 AM 01/27/2022    8:02 AM 06/18/2021   10:32 AM  Depression screen PHQ 2/9  Decreased Interest 0 0 0 0 0  Down, Depressed, Hopeless 0 0 0 0 0  PHQ - 2 Score 0 0 0 0 0  Altered sleeping 0    1  Tired, decreased energy 0    1  Change in appetite 0    0  Feeling bad or failure about yourself  0    0  Trouble concentrating 0    0  Moving slowly or fidgety/restless 0  0  Suicidal thoughts 0    0  PHQ-9 Score 0    2  Difficult doing work/chores Not difficult at all          10/21/2023    9:01 AM 11/20/2020   10:38 AM 01/18/2020    9:18 AM 07/18/2019   10:56 AM  GAD 7 : Generalized Anxiety Score  Nervous, Anxious, on Edge 0 1 0 1  Control/stop worrying 0 1 0 0  Worry too much - different things 0 1 0 0  Trouble relaxing 0 1 3 1   Restless 0 1 3 1   Easily annoyed or irritable 0 1 0 1  Afraid - awful might happen 0 0 0 0  Total GAD 7 Score 0 6 6 4   Anxiety Difficulty Not difficult at all Not difficult at all Not difficult at all Not difficult at all           10/21/2023    9:01 AM 02/02/2023    8:11 AM 12/30/2022    8:59 AM 03/04/2022   10:04 AM 01/27/2022    8:05 AM  Fall Risk   Falls in the past year? 0 0 0 1 1  Number falls in past yr: 0 0 0 0 0  Comment     rolled off couch  Injury with Fall? 0 0 0 0 1  Comment     hit head  Risk for fall due to : No Fall Risks Impaired vision  History of fall(s)   Risk for fall due to: Comment  readers     Follow up Falls evaluation completed Falls prevention discussed Falls evaluation completed Falls evaluation completed  Falls prevention discussed      Data saved with a previous flowsheet row definition    Immunization History  Administered Date(s) Administered   Fluad Quad(high Dose 65+) 07/18/2019, 06/18/2021   Influenza, High Dose Seasonal PF 08/11/2016, 07/29/2017, 07/19/2018, 07/22/2022, 08/17/2023   Influenza,inj,Quad PF,6+ Mos 07/11/2013, 08/07/2014, 07/11/2015   Influenza-Unspecified 07/27/2017, 08/02/2020   Moderna Covid-19 Fall Seasonal Vaccine 44yrs & older 09/23/2023   PFIZER Comirnaty(Gray Top)Covid-19 Tri-Sucrose Vaccine 02/16/2021   PFIZER(Purple Top)SARS-COV-2 Vaccination 10/25/2019, 11/15/2019, 07/03/2020   PNEUMOCOCCAL CONJUGATE-20 06/18/2021   Pfizer Covid-19 Vaccine Bivalent Booster 54yrs & up 07/24/2021   Pneumococcal Conjugate-13 08/11/2016, 11/16/2018   Td 03/27/2020   Tdap 06/26/2014   Unspecified SARS-COV-2 Vaccination 07/22/2022   Zoster Recombinant(Shingrix) 10/25/2021, 02/11/2022    Past Medical History:  Diagnosis Date   Arthritis    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 Rollin)   HTN (hypertension)    Melanoma (HCC) [id number]   MM 2 CHEST TX EXC   Neuromuscular disorder (HCC)    OA (osteoarthritis) of knee 02/20/2019   SCC (squamous cell carcinoma)  05/15/2007   SCCA RIGHT FOREARM TX WITH BX   SCC (squamous cell carcinoma) 07/04/2013   WELL DIFF SCC LEFT FOREARM CX3 5FU   SCC (squamous cell carcinoma) 06/26/2014   SCC IN SITU TOP OF SCALP TX CX3 5FU   SCC (squamous cell carcinoma) 07/27/2017   SCC IN SITU RIGHT SHOULDER TX CX3 5FU   Squamous cell carcinoma of skin 11/10/2004   SCC IN SITU RIGHT FOREARM CX3 5FU   Allergies  Allergen Reactions  Penicillins Rash and Other (See Comments)    Has patient had a PCN reaction causing immediate rash, facial/tongue/throat swelling, SOB or lightheadedness with hypotension: Unknown Has patient had a PCN reaction causing severe rash involving mucus membranes or skin necrosis: No Has patient had a PCN reaction that required hospitalization: No Has patient had a PCN reaction occurring within the last 10 years: No If all of the above answers are NO, then may proceed with Cephalosporin use.    Past Surgical History:  Procedure Laterality Date   COLONOSCOPY W/ POLYPECTOMY  approx 2007; 11/2014   Dr. 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   Procedure: LEFT TOTAL KNEE ARTHROPLASTY;  Surgeon: Melodi Lerner, MD;  Location: [facility];  Service: Orthopedics;  Laterality: Left;  50 mins   Family History  Problem Relation Age of Onset   Heart disease Father    Alcohol abuse Father    Obesity Brother    Asperger's syndrome Brother    Obesity Mother    Tremor Mother    Anxiety disorder Mother    Social History   Social History Narrative   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    Allergies as of 03/28/2024       Reactions   Penicillins Rash, Other (See Comments)   Has patient had a PCN reaction causing immediate rash, facial/tongue/throat swelling,  SOB or lightheadedness with hypotension: Unknown Has patient had a PCN reaction causing severe rash involving mucus membranes or skin necrosis: No Has patient had a PCN reaction that required hospitalization: No Has patient had a PCN reaction occurring within the last 10 years: No If all of the above answers are NO, then may proceed with Cephalosporin use.        Medication List        Accurate as of March 28, 2024  3:19 PM. If you have any questions, ask your nurse or doctor.          LORazepam  1 MG tablet Commonly known as: ATIVAN  Take 1 tablet (1 mg total) by mouth 2 (two) times daily as needed for anxiety.   omega-3 acid ethyl esters 1 g capsule Commonly known as: LOVAZA  Take 2 capsules (2 g total) by mouth 2 (two) times daily.   traMADol  50 MG tablet Commonly known as: ULTRAM  Take 1-2 tablets (50-100 mg total) by mouth every 12 (twelve) hours as needed (mild pain).   Turmeric 500 MG Caps Take 1 tablet by mouth.       All past medical history, surgical history, allergies, family history, immunizations andmedications were updated in the EMR today and reviewed under the history and medication portions of their EMR.     No results found for this or any previous visit (from the past 2160 hours).  No results found.   ROS: 14 pt review of systems performed and negative (unless mentioned in an HPI)  Objective: BP 130/80   Pulse 60   Temp 98 F (36.7 C)   Ht 6' (1.829 m)   Wt 237 lb 3.2 oz (107.6 kg)   SpO2 96%   BMI 32.17 kg/m  Physical Exam Constitutional:      General: He is not in acute distress.  Appearance: Normal appearance. He is not ill-appearing, toxic-appearing or diaphoretic.  HENT:     Head: Normocephalic and atraumatic.     Right Ear: Tympanic membrane, ear canal and external ear normal. There is no impacted cerumen.     Left Ear: Tympanic membrane, ear canal and external ear normal. There is no impacted cerumen.     Nose: Nose normal. No  congestion or rhinorrhea.     Mouth/Throat:     Mouth: Mucous membranes are moist.     Pharynx: Oropharynx is clear. No oropharyngeal exudate or posterior oropharyngeal erythema.   Eyes:     General: No scleral icterus.       Right eye: No discharge.        Left eye: No discharge.     Extraocular Movements: Extraocular movements intact.     Pupils: Pupils are equal, round, and reactive to light.    Cardiovascular:     Rate and Rhythm: Normal rate and regular rhythm.     Pulses: Normal pulses.     Heart sounds: Normal heart sounds. No murmur heard.    No friction rub. No gallop.  Pulmonary:     Effort: Pulmonary effort is normal. No respiratory distress.     Breath sounds: Normal breath sounds. No stridor. No wheezing, rhonchi or rales.  Chest:     Chest wall: No tenderness.  Abdominal:     General: Abdomen is flat. Bowel sounds are normal. There is no distension.     Palpations: Abdomen is soft. There is no mass.     Tenderness: There is no abdominal tenderness. There is no right CVA tenderness, left CVA tenderness, guarding or rebound.     Hernia: No hernia is present.   Musculoskeletal:        General: No swelling or tenderness. Normal range of motion.     Cervical back: Normal range of motion and neck supple.     Right lower leg: No edema.     Left lower leg: No edema.  Lymphadenopathy:     Cervical: No cervical adenopathy.   Skin:    General: Skin is warm and dry.     Coloration: Skin is not jaundiced.     Findings: No bruising, lesion or rash.   Neurological:     General: No focal deficit present.     Mental Status: He is alert and oriented to person, place, and time. Mental status is at baseline.     Cranial Nerves: No cranial nerve deficit.     Sensory: No sensory deficit.     Motor: No weakness.     Coordination: Coordination normal.     Gait: Gait normal.     Deep Tendon Reflexes: Reflexes normal.   Psychiatric:        Mood and Affect: Mood normal.         Behavior: Behavior normal.        Thought Content: Thought content normal.        Judgment: Judgment normal.     No results found.  Assessment/plan: Henry Knight is a 73 y.o. male present for CPE and Chronic Conditions/illness Management HTN/Hypertriglyceridemia/Obesity (BMI 30-39.9) Improved since he retired - diet and routine exercise.  -Continue to monitor with goal less than 130/80.   Chronic prescription benzodiazepine use/Controlled substance agreement signed/Primary osteoarthritis involving multiple joints/Social anxiety disorder/ Essential tremor Stable Continue tramadol  as needed Continue Ativan  as needed # pills per 90d: tramadol /#90 Ativan  Last UDS date: UTD Pain contract  signed (Y/N): Y, Date narcotic database last reviewed (include red flags):  03/28/24   Routine general medical examination at a health care facility Patient was encouraged to exercise greater than 150 minutes a week. Patient was encouraged to choose a diet filled with fresh fruits and vegetables, and lean meats. AVS provided to patient today for education/recommendation on gender specific health and safety maintenance. Colonoscopy: 11/16 2023 recommend follow up 7 years Completed by Dr. Rollin.  Immunizations:  tdap UTD 2021, influenza  UTD, PNA series completed, Shingrix series completed.  Infectious disease screening:  Hep C completed  Prostate cancer screening - PSA, Medicare Diabetes mellitus screening - Hemoglobin A1c (BMI 30-39.9) - Hemoglobin A1c - Lipid panel  Return for Routine chronic condition follow-up.  Orders Placed This Encounter  Procedures   CBC   Comprehensive metabolic panel with GFR   Hemoglobin A1c   Lipid panel   PSA, Medicare   TSH    Meds ordered this encounter  Medications   traMADol  (ULTRAM ) 50 MG tablet    Sig: Take 1-2 tablets (50-100 mg total) by mouth every 12 (twelve) hours as needed (mild pain).    Dispense:  180 tablet    Refill:  1    LORazepam  (ATIVAN ) 1 MG tablet    Sig: Take 1 tablet (1 mg total) by mouth 2 (two) times daily as needed for anxiety.    Dispense:  180 tablet    Refill:  1    Referral Orders  No referral(s) requested today     Note is dictated utilizing voice recognition software. Although note has been proof read prior to signing, occasional typographical errors still can be missed. If any questions arise, please do not hesitate to call for verification.  Electronically signed by: Charlies Bellini, DO [facility] [place]

## 2024-03-28 NOTE — Patient Instructions (Addendum)
 Return for Routine chronic condition follow-up.        Great to see you today.  I have refilled the medication(s) we provide.   If labs were collected or images ordered, we will inform you of  results once we have received them and reviewed. We will contact you either by echart message, or telephone call.  Please give ample time to the testing facility, and our office to run,  receive and review results. Please do not call inquiring of results, even if you can see them in your chart. We will contact you as soon as we are able. If it has been over 1 week since the test was completed, and you have not yet heard from us , then please call us .    - echart message- for normal results that have been seen by the patient already.   - telephone call: abnormal results or if patient has not viewed results in their echart.  If a referral to a specialist was entered for you, please call us  in 2 weeks if you have not heard from the specialist office to schedule.

## 2024-03-29 ENCOUNTER — Ambulatory Visit: Payer: Self-pay | Admitting: Family Medicine

## 2024-04-15 ENCOUNTER — Other Ambulatory Visit: Payer: Self-pay | Admitting: Family Medicine

## 2024-05-08 ENCOUNTER — Encounter: Payer: Self-pay | Admitting: Family Medicine

## 2024-05-08 ENCOUNTER — Ambulatory Visit (INDEPENDENT_AMBULATORY_CARE_PROVIDER_SITE_OTHER): Admitting: Family Medicine

## 2024-05-08 ENCOUNTER — Telehealth: Payer: Self-pay

## 2024-05-08 VITALS — BP 130/80 | HR 72 | Temp 98.1°F | Wt 241.0 lb

## 2024-05-08 DIAGNOSIS — Z85828 Personal history of other malignant neoplasm of skin: Secondary | ICD-10-CM | POA: Diagnosis not present

## 2024-05-08 DIAGNOSIS — L989 Disorder of the skin and subcutaneous tissue, unspecified: Secondary | ICD-10-CM | POA: Diagnosis not present

## 2024-05-08 NOTE — Patient Instructions (Signed)

## 2024-05-08 NOTE — Progress Notes (Signed)
 Henry Knight , 04-24-1951, 73 y.o., male MRN: [id number] Patient Care Team    Relationship Specialty Notifications Start End  Catherine Charlies LABOR, DO PCP - General Family Medicine  07/11/15   Rollin Dover, MD Consulting Physician Gastroenterology  08/11/16   Melodi Lerner, MD Consulting Physician Orthopedic Surgery  08/11/16   Carlie Clark, MD Consulting Physician Otolaryngology  08/11/16   Sciences, [facility] (Inactive)    10/13/16    Comment: plastic and reconstructiion [facility] (on Henry Knight in [place])  Livingston Rigg, MD Consulting Physician Dermatology  06/30/17     Chief Complaint  Patient presents with   Skin Lesion    1 month; mild irritation. L arm. Pt has not taken/applied anything.      Subjective: Henry Knight is a 73 y.o. Pt presents for an OV with complaints of skin lesion of 4 weeks duration.  Associated symptoms include itchy. He reports he noticed the new skin lesion about 4 weeks ago. It has become larger. It is raised, and has a central excoriation. He reports though it was a pimple when he first saw it and tried squeezing it, but never expelled contents.  H/o BCC, SCC and melanoma. Established with Dr. Alyce Hoof- next appt in November.      10/21/2023    9:01 AM 02/02/2023    8:08 AM 12/30/2022    8:59 AM 01/27/2022    8:02 AM 06/18/2021   10:32 AM  Depression screen PHQ 2/9  Decreased Interest 0 0 0 0 0  Down, Depressed, Hopeless 0 0 0 0 0  PHQ - 2 Score 0 0 0 0 0  Altered sleeping 0    1  Tired, decreased energy 0    1  Change in appetite 0    0  Feeling bad or failure about yourself  0    0  Trouble concentrating 0    0  Moving slowly or fidgety/restless 0    0  Suicidal thoughts 0    0  PHQ-9 Score 0    2  Difficult doing work/chores Not difficult at all        Allergies  Allergen Reactions   Penicillins Rash and Other (See Comments)    Has patient had a PCN reaction causing immediate rash, facial/tongue/throat swelling,  SOB or lightheadedness with hypotension: Unknown Has patient had a PCN reaction causing severe rash involving mucus membranes or skin necrosis: No Has patient had a PCN reaction that required hospitalization: No Has patient had a PCN reaction occurring within the last 10 years: No If all of the above answers are NO, then may proceed with Cephalosporin use.    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   Past Medical History:  Diagnosis Date   Arthritis    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 Rollin)   HTN (hypertension)    Melanoma (HCC) [id number]   MM 2 CHEST TX EXC   Neuromuscular disorder (HCC)    OA (osteoarthritis) of knee 02/20/2019   SCC (squamous cell carcinoma) 05/15/2007   SCCA RIGHT FOREARM TX WITH BX   SCC (  squamous cell carcinoma) 07/04/2013   WELL DIFF SCC LEFT FOREARM CX3 5FU   SCC (squamous cell carcinoma) 06/26/2014   SCC IN SITU TOP OF SCALP TX CX3 5FU   SCC (squamous cell carcinoma) 07/27/2017   SCC IN SITU RIGHT SHOULDER TX CX3 5FU   Squamous cell carcinoma of skin 11/10/2004   SCC IN SITU RIGHT FOREARM CX3 5FU   Past Surgical History:  Procedure Laterality Date   COLONOSCOPY W/ POLYPECTOMY  approx 2007; 11/2014   Dr. 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   Procedure: LEFT TOTAL KNEE ARTHROPLASTY;  Surgeon: Melodi Lerner, MD;  Location: [facility];  Service: Orthopedics;  Laterality: Left;  50 mins   Family History  Problem Relation Age of Onset   Heart disease  Father    Alcohol abuse Father    Obesity Brother    Asperger's syndrome Brother    Obesity Mother    Tremor Mother    Anxiety disorder Mother    Allergies as of 05/08/2024       Reactions   Penicillins Rash, Other (See Comments)   Has patient had a PCN reaction causing immediate rash, facial/tongue/throat swelling, SOB or lightheadedness with hypotension: Unknown Has patient had a PCN reaction causing severe rash involving mucus membranes or skin necrosis: No Has patient had a PCN reaction that required hospitalization: No Has patient had a PCN reaction occurring within the last 10 years: No If all of the above answers are NO, then may proceed with Cephalosporin use.        Medication List        Accurate as of May 08, 2024  1:57 PM. If you have any questions, ask your nurse or doctor.          LORazepam  1 MG tablet Commonly known as: ATIVAN  Take 1 tablet (1 mg total) by mouth 2 (two) times daily as needed for anxiety.   omega-3 acid ethyl esters 1 g capsule Commonly known as: LOVAZA  TAKE 2 CAPSULES BY MOUTH 2 TIMES DAILY.   traMADol  50 MG tablet Commonly known as: ULTRAM  Take 1-2 tablets (50-100 mg total) by mouth every 12 (twelve) hours as needed (mild pain).   Turmeric 500 MG Caps Take 1 tablet by mouth.        All past medical history, surgical history, allergies, family history, immunizations andmedications were updated in the EMR today and reviewed under the history and medication portions of their EMR.     ROS Negative, with the exception of above mentioned in HPI   Objective:  BP 130/80   Pulse 72   Temp 98.1 F (36.7 C)   Wt 241 lb (109.3 kg)   SpO2 98%   BMI 32.69 kg/m  Body mass index is 32.69 kg/m.  Physical Exam Vitals and nursing note reviewed. Exam conducted with a chaperone present.  Constitutional:      General: He is not in acute distress.    Appearance: Normal appearance. He is not ill-appearing, toxic-appearing or  diaphoretic.  HENT:     Head: Normocephalic and atraumatic.  Eyes:     General: No scleral icterus.       Right eye: No discharge.        Left eye: No discharge.  Extraocular Movements: Extraocular movements intact.     Pupils: Pupils are equal, round, and reactive to light.  Skin:    General: Skin is warm and dry.     Coloration: Skin is not jaundiced or pale.     Findings: Lesion (0.9cm x 0.6cm round raised/domed red skin lesion with central excoriation) present. No rash.  Neurological:     Mental Status: He is alert and oriented to person, place, and time. Mental status is at baseline.  Psychiatric:        Mood and Affect: Mood normal.        Behavior: Behavior normal.        Thought Content: Thought content normal.        Judgment: Judgment normal.      No results found. No results found. No results found for this or any previous visit (from the past 24 hours).  Assessment/Plan: Henry Knight is a 73 y.o. male present for OV for  Skin lesion (Primary)/History of skin cancer - poss SCC by appearance. Would side on caution with his h/o of multiple skin cancers. - small chance epidermal cyst or FB - Ambulatory referral to Dermatology> urgently  Reviewed expectations re: course of current medical issues. Discussed self-management of symptoms. Outlined signs and symptoms indicating need for more acute intervention. Patient verbalized understanding and all questions were answered. Patient received an After-Visit Summary.    Orders Placed This Encounter  Procedures   Ambulatory referral to Dermatology   No orders of the defined types were placed in this encounter.  Referral Orders         Ambulatory referral to Dermatology       Note is dictated utilizing voice recognition software. Although note has been proof read prior to signing, occasional typographical errors still can be missed. If any questions arise, please do not hesitate to call for  verification.   electronically signed by:  Charlies Bellini, DO  [facility] [place]

## 2024-05-08 NOTE — Telephone Encounter (Signed)
 Copied from CRM #[id number]. Topic: Referral - Status >> May 08, 2024  3:54 PM Burnard DEL wrote: Reason for CRM: Patient called in stating that referral for dermatology was suppose to be sent to his regular dermatology which is Elouise Hoof at [facility] at [address].Patient would like to try and get in with him by the end of the week but just needs referral to be sent over to them.

## 2024-05-08 NOTE — Telephone Encounter (Signed)
 Msg routed to referral coordinator

## 2024-05-11 NOTE — Progress Notes (Signed)
 No further action needed.

## 2024-05-17 DIAGNOSIS — Z85828 Personal history of other malignant neoplasm of skin: Secondary | ICD-10-CM | POA: Diagnosis not present

## 2024-05-17 DIAGNOSIS — L570 Actinic keratosis: Secondary | ICD-10-CM | POA: Diagnosis not present

## 2024-05-17 DIAGNOSIS — C44629 Squamous cell carcinoma of skin of left upper limb, including shoulder: Secondary | ICD-10-CM | POA: Diagnosis not present

## 2024-05-17 DIAGNOSIS — Z08 Encounter for follow-up examination after completed treatment for malignant neoplasm: Secondary | ICD-10-CM | POA: Diagnosis not present

## 2024-05-17 DIAGNOSIS — D485 Neoplasm of uncertain behavior of skin: Secondary | ICD-10-CM | POA: Diagnosis not present

## 2024-06-20 DIAGNOSIS — L905 Scar conditions and fibrosis of skin: Secondary | ICD-10-CM | POA: Diagnosis not present

## 2024-06-20 DIAGNOSIS — C44629 Squamous cell carcinoma of skin of left upper limb, including shoulder: Secondary | ICD-10-CM | POA: Diagnosis not present

## 2024-08-01 ENCOUNTER — Ambulatory Visit

## 2024-08-01 ENCOUNTER — Telehealth: Payer: Self-pay

## 2024-08-01 ENCOUNTER — Encounter: Payer: Self-pay | Admitting: Family Medicine

## 2024-08-01 DIAGNOSIS — Z0000 Encounter for general adult medical examination without abnormal findings: Secondary | ICD-10-CM | POA: Diagnosis not present

## 2024-08-01 MED ORDER — OMEGA-3-ACID ETHYL ESTERS 1 G PO CAPS: 2.0000 g | ORAL_CAPSULE | Freq: Two times a day (BID) | ORAL | 0 refills | Status: DC

## 2024-08-01 NOTE — Patient Instructions (Signed)
 Health Maintenance, Male  Adopting a healthy lifestyle and getting preventive care are important in promoting health and wellness. Ask your health care provider about:  The right schedule for you to have regular tests and exams.  Things you can do on your own to prevent diseases and keep yourself healthy.  What should I know about diet, weight, and exercise?  Eat a healthy diet    Eat a diet that includes plenty of vegetables, fruits, low-fat dairy products, and lean protein.  Do not eat a lot of foods that are high in solid fats, added sugars, or sodium.  Maintain a healthy weight  Body mass index (BMI) is a measurement that can be used to identify possible weight problems. It estimates body fat based on height and weight. Your health care provider can help determine your BMI and help you achieve or maintain a healthy weight.  Get regular exercise  Get regular exercise. This is one of the most important things you can do for your health. Most adults should:  Exercise for at least 150 minutes each week. The exercise should increase your heart rate and make you sweat (moderate-intensity exercise).  Do strengthening exercises at least twice a week. This is in addition to the moderate-intensity exercise.  Spend less time sitting. Even light physical activity can be beneficial.  Watch cholesterol and blood lipids  Have your blood tested for lipids and cholesterol at 74 years of age, then have this test every 5 years.  You may need to have your cholesterol levels checked more often if:  Your lipid or cholesterol levels are high.  You are older than 74 years of age.  You are at high risk for heart disease.  What should I know about cancer screening?  Many types of cancers can be detected early and may often be prevented. Depending on your health history and family history, you may need to have cancer screening at various ages. This may include screening for:  Colorectal cancer.  Prostate cancer.  Skin cancer.  Lung  cancer.  What should I know about heart disease, diabetes, and high blood pressure?  Blood pressure and heart disease  High blood pressure causes heart disease and increases the risk of stroke. This is more likely to develop in people who have high blood pressure readings or are overweight.  Talk with your health care provider about your target blood pressure readings.  Have your blood pressure checked:  Every 3-5 years if you are 24-52 years of age.  Every year if you are 3 years old or older.  If you are between the ages of 60 and 72 and are a current or former smoker, ask your health care provider if you should have a one-time screening for abdominal aortic aneurysm (AAA).  Diabetes  Have regular diabetes screenings. This checks your fasting blood sugar level. Have the screening done:  Once every three years after age 66 if you are at a normal weight and have a low risk for diabetes.  More often and at a younger age if you are overweight or have a high risk for diabetes.  What should I know about preventing infection?  Hepatitis B  If you have a higher risk for hepatitis B, you should be screened for this virus. Talk with your health care provider to find out if you are at risk for hepatitis B infection.  Hepatitis C  Blood testing is recommended for:  Everyone born from [birth year] through 1966.  Anyone  with known risk factors for hepatitis C.  Sexually transmitted infections (STIs)  You should be screened each year for STIs, including gonorrhea and chlamydia, if:  You are sexually active and are younger than 74 years of age.  You are older than 74 years of age and your health care provider tells you that you are at risk for this type of infection.  Your sexual activity has changed since you were last screened, and you are at increased risk for chlamydia or gonorrhea. Ask your health care provider if you are at risk.  Ask your health care provider about whether you are at high risk for HIV. Your health care provider  may recommend a prescription medicine to help prevent HIV infection. If you choose to take medicine to prevent HIV, you should first get tested for HIV. You should then be tested every 3 months for as long as you are taking the medicine.  Follow these instructions at home:  Alcohol use  Do not drink alcohol if your health care provider tells you not to drink.  If you drink alcohol:  Limit how much you have to 0-2 drinks a day.  Know how much alcohol is in your drink. In the [place] one drink equals one 12 oz bottle of beer (355 mL), one 5 oz glass of wine (148 mL), or one 1 oz glass of hard liquor (44 mL).  Lifestyle  Do not use any products that contain nicotine or tobacco. These products include cigarettes, chewing tobacco, and vaping devices, such as e-cigarettes. If you need help quitting, ask your health care provider.  Do not use street drugs.  Do not share needles.  Ask your health care provider for help if you need support or information about quitting drugs.  General instructions  Schedule regular health, dental, and eye exams.  Stay current with your vaccines.  Tell your health care provider if:  You often feel depressed.  You have ever been abused or do not feel safe at home.  Summary  Adopting a healthy lifestyle and getting preventive care are important in promoting health and wellness.  Follow your health care provider's instructions about healthy diet, exercising, and getting tested or screened for diseases.  Follow your health care provider's instructions on monitoring your cholesterol and blood pressure.  This information is not intended to replace advice given to you by your health care provider. Make sure you discuss any questions you have with your health care provider.  Document Revised: 02/09/2021 Document Reviewed: 02/09/2021  Elsevier Patient Education  2024 [employer].

## 2024-08-01 NOTE — Progress Notes (Deleted)
 Subjective:   Henry Knight is a 73 y.o. male who presents for Medicare Annual/Subsequent preventive examination.  Visit Complete: {VISITMETHODVS:[phone]}  Patient Medicare AWV questionnaire was completed by the patient on ***; I have confirmed that all information answered by patient is correct and no changes since this date.        Objective:    There were no vitals filed for this visit. There is no height or weight on file to calculate BMI.     02/02/2023    8:10 AM 01/27/2022    8:04 AM 10/10/2017   11:23 AM 10/06/2017   10:24 AM 11/04/2015   11:40 AM 11/04/2015   11:30 AM  Advanced Directives  Does Patient Have a Medical Advance Directive? No Yes No  No  No  No   Type of Advance Directive  Living will      Would patient like information on creating a medical advance directive? No - Patient declined  No - Patient declined  No - Patient declined  No - patient declined information  No - patient declined information      Data saved with a previous flowsheet row definition    Current Medications (verified) Outpatient Encounter Medications as of 08/02/2024  Medication Sig   LORazepam  (ATIVAN ) 1 MG tablet Take 1 tablet (1 mg total) by mouth 2 (two) times daily as needed for anxiety.   omega-3 acid ethyl esters (LOVAZA ) 1 g capsule TAKE 2 CAPSULES BY MOUTH 2 TIMES DAILY.   traMADol  (ULTRAM ) 50 MG tablet Take 1-2 tablets (50-100 mg total) by mouth every 12 (twelve) hours as needed (mild pain).   Turmeric 500 MG CAPS Take 1 tablet by mouth.   No facility-administered encounter medications on file as of 08/02/2024.    Allergies (verified) Penicillins   History: Past Medical History:  Diagnosis Date   Arthritis    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  Rollin)   HTN (hypertension)    Melanoma (HCC) [id number]   MM 2 CHEST TX EXC   Neuromuscular disorder (HCC)    OA (osteoarthritis) of knee 02/20/2019   SCC (squamous cell carcinoma) 05/15/2007   SCCA RIGHT FOREARM TX WITH BX   SCC (squamous cell carcinoma) 07/04/2013   WELL DIFF SCC LEFT FOREARM CX3 5FU   SCC (squamous cell carcinoma) 06/26/2014   SCC IN SITU TOP OF SCALP TX CX3 5FU   SCC (squamous cell carcinoma) 07/27/2017   SCC IN SITU RIGHT SHOULDER TX CX3 5FU   Squamous cell carcinoma of skin 11/10/2004   SCC IN SITU RIGHT FOREARM CX3 5FU   Past Surgical History:  Procedure Laterality Date   COLONOSCOPY W/ POLYPECTOMY  approx 2007; 11/2014   Dr. 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   Procedure: LEFT TOTAL KNEE ARTHROPLASTY;  Surgeon: Melodi Lerner, MD;  Location: [facility];  Service: Orthopedics;  Laterality: Left;  50 mins   Family History  Problem Relation Age of Onset   Heart disease Father    Alcohol abuse  Father    Obesity Brother    Asperger's syndrome Brother    Obesity Mother    Tremor Mother    Anxiety disorder Mother    Social History   Socioeconomic History   Marital status: Married    Spouse name: Not on file   Number of children: 1   Years of education: Not on file   Highest education level: Not on file  Occupational History   Not on file  Tobacco Use   Smoking status: Never   Smokeless tobacco: Never  Vaping Use   Vaping status: Never Used  Substance and Sexual Activity   Alcohol use: Yes    Alcohol/week: 8.0 standard drinks of alcohol    Types: 8 Glasses of wine per week    Comment: Weekend   Drug use: No   Sexual activity: Yes    Partners: Female  Other Topics Concern   Not on file  Social History Narrative   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 He lives with his wife & daughter.   Social Drivers of Corporate Investment Banker Strain: Low Risk  (02/02/2023)   Overall Financial Resource Strain (CARDIA)    Difficulty of Paying Living Expenses: Not hard at all  Food Insecurity: No Food Insecurity (02/02/2023)   Hunger Vital Sign    Worried About Running Out of Food in the Last Year: Never true    Ran Out of Food in the Last Year: Never true  Transportation Needs: No Transportation Needs (02/02/2023)   PRAPARE - Administrator, Civil Service (Medical): No    Lack of Transportation (Non-Medical): No  Physical Activity: Sufficiently Active (02/02/2023)   Exercise Vital Sign    Days of Exercise per Week: 5 days    Minutes of Exercise per Session: 90 min  Stress: No Stress Concern Present (02/02/2023)   [employer] of Occupational Health - Occupational Stress Questionnaire    Feeling of Stress : Not at all  Social Connections: Moderately Integrated (02/02/2023)   Social Connection and Isolation Panel    Frequency of Communication with Friends and Family: Twice a week    Frequency of Social Gatherings with Friends and Family: More than three times a week    Attends Religious Services: More than 4 times per year    Active Member of [employer] or Organizations: No    Attends Banker Meetings: Never    Marital Status: Married    Tobacco Counseling Counseling given: Not Answered   Clinical Intake:                        Activities of Daily Living     No data to display          Patient Care Team: Catherine Charlies LABOR, DO as PCP - General (Family Medicine) Rollin Dover, MD as Consulting Physician (Gastroenterology) Melodi Lerner, MD as Consulting Physician (Orthopedic Surgery) Carlie Clark, MD as Consulting Physician (Otolaryngology) [facility] (Inactive) Livingston Rigg, MD as Consulting Physician (Dermatology)  Indicate any recent Medical  Services you may have received from other than [facility] providers in the past year (date may be approximate).     Assessment:   This is a routine wellness examination for Henry Knight.  Hearing/Vision screen No results found.   Goals Addressed   None    Depression Screen    10/21/2023    9:01 AM 02/02/2023    8:08 AM 12/30/2022  8:59 AM 01/27/2022    8:02 AM 06/18/2021   10:32 AM 11/20/2020   10:38 AM 11/20/2020   10:28 AM  PHQ 2/9 Scores  PHQ - 2 Score 0 0 0 0 0 1 0  PHQ- 9 Score 0    2 4     Fall Risk    10/21/2023    9:01 AM 02/02/2023    8:11 AM 12/30/2022    8:59 AM 03/04/2022   10:04 AM 01/27/2022    8:05 AM  Fall Risk   Falls in the past year? 0 0 0 1 1  Number falls in past yr: 0 0 0 0 0  Comment     rolled off couch  Injury with Fall? 0 0 0 0 1  Comment     hit head  Risk for fall due to : No Fall Risks Impaired vision  History of fall(s)   Risk for fall due to: Comment  readers     Follow up Falls evaluation completed Falls prevention discussed Falls evaluation completed Falls evaluation completed  Falls prevention discussed      Data saved with a previous flowsheet row definition    MEDICARE RISK AT HOME:    TIMED UP AND GO:  Was the test performed?  {AMBTIMEDUPGO:[phone]}    Cognitive Function:        02/02/2023    8:12 AM 01/27/2022    8:15 AM  6CIT Screen  What Year? 0 points 0 points  What month? 0 points 0 points  What time? 0 points 0 points  Count back from 20 0 points 0 points  Months in reverse 0 points 0 points  Repeat phrase 0 points 0 points  Total Score 0 points 0 points    Immunizations Immunization History  Administered Date(s) Administered   Fluad Quad(high Dose 65+) 07/18/2019, 06/18/2021   INFLUENZA, HIGH DOSE SEASONAL PF 08/11/2016, 07/29/2017, 07/19/2018, 07/22/2022, 08/17/2023   Influenza,inj,Quad PF,6+ Mos 07/11/2013, 08/07/2014, 07/11/2015   Influenza-Unspecified 07/27/2017, 08/02/2020   Moderna Covid-19 Fall Seasonal Vaccine  69yrs & older 09/23/2023   PFIZER Comirnaty(Gray Top)Covid-19 Tri-Sucrose Vaccine 02/16/2021   PFIZER(Purple Top)SARS-COV-2 Vaccination 10/25/2019, 11/15/2019, 07/03/2020   PNEUMOCOCCAL CONJUGATE-20 06/18/2021   Pfizer Covid-19 Vaccine Bivalent Booster 33yrs & up 07/24/2021   Pneumococcal Conjugate-13 08/11/2016, 11/16/2018   Td 03/27/2020   Tdap 06/26/2014   Unspecified SARS-COV-2 Vaccination 07/22/2022   Zoster Recombinant(Shingrix) 10/25/2021, 02/11/2022    {TDAP status:2101805}  {Flu Vaccine status:2101806}  {Pneumococcal vaccine status:2101807}  {Covid-19 vaccine status:2101808}  Qualifies for Shingles Vaccine? {YES/NO:21197}  Zostavax completed {YES/NO:21197}  {Shingrix Completed?:2101804}  Screening Tests Health Maintenance  Topic Date Due   Medicare Annual Wellness (AWV)  02/02/2024   Influenza Vaccine  05/04/2024   Colonoscopy  08/19/2029   DTaP/Tdap/Td (3 - Td or Tdap) 03/27/2030   Pneumococcal Vaccine: 50+ Years  Completed   Hepatitis C Screening  Completed   Zoster Vaccines- Shingrix  Completed   Meningococcal B Vaccine  Aged Out   COVID-19 Vaccine  Discontinued    Health Maintenance  Health Maintenance Due  Topic Date Due   Medicare Annual Wellness (AWV)  02/02/2024   Influenza Vaccine  05/04/2024    {Colorectal cancer screening:2101809}  Lung Cancer Screening: (Low Dose CT Chest recommended if Age 66-80 years, 20 pack-year currently smoking OR have quit w/in 15years.) {DOES NOT does:27190::does not} qualify.   Lung Cancer Screening Referral: ***  Additional Screening:  Hepatitis C Screening: {DOES NOT does:27190::does not} qualify; Completed ***  Vision Screening:  Recommended annual ophthalmology exams for early detection of glaucoma and other disorders of the eye. Is the patient up to date with their annual eye exam?  {YES/NO:21197} Who is the provider or what is the name of the office in which the patient attends annual eye exams? [facility]   If pt is not established with a provider, would they like to be referred to a provider to establish care? {YES/NO:21197}.   Dental Screening: Recommended annual dental exams for proper oral hygiene  Diabetic Foot Exam: {Diabetic Foot Exam:2101802}  Community Resource Referral / Chronic Care Management: CRR required this visit?  {YES/NO:21197}  CCM required this visit?  {CCM Required choices:[phone]}     Plan:     I have personally reviewed and noted the following in the patient's chart:   Medical and social history Use of alcohol, tobacco or illicit drugs  Current medications and supplements including opioid prescriptions. {Opioid Prescriptions:[phone]} Functional ability and status Nutritional status Physical activity Advanced directives List of other physicians Hospitalizations, surgeries, and ER visits in previous 12 months Vitals Screenings to include cognitive, depression, and falls Referrals and appointments  In addition, I have reviewed and discussed with patient certain preventive protocols, quality metrics, and best practice recommendations. A written personalized care plan for preventive services as well as general preventive health recommendations were provided to patient.     Shanda LELON Sharps, CMA   08/01/2024   After Visit Summary: {CHL AMB AWV After Visit [phone]}  Nurse Notes: ***

## 2024-08-01 NOTE — Progress Notes (Signed)
 Subjective:   Henry Knight is a 73 y.o. male who presents for Medicare Annual/Subsequent preventive examination.  Visit Complete: Virtual I connected with  Henry Knight on 08/02/24 by a audio enabled telemedicine application and verified that I am speaking with the correct person using two identifiers.  Patient Location: Home  Provider Location: Office/Clinic  I discussed the limitations of evaluation and management by telemedicine. The patient expressed understanding and agreed to proceed.  Vital Signs: Because this visit was a virtual/telehealth visit, some criteria may be missing or patient reported. Any vitals not documented were not able to be obtained and vitals that have been documented are patient reported.  Patient Medicare AWV questionnaire was completed by the patient on 08/01/24; I have confirmed that all information answered by patient is correct and no changes since this date.  Cardiac Risk Factors include: advanced age (>28men, >50 women);male gender;obesity (BMI >30kg/m2);hypertension     Objective:    There were no vitals filed for this visit. There is no height or weight on file to calculate BMI.     08/01/2024    2:40 PM 08/01/2024    2:24 PM 02/02/2023    8:10 AM 01/27/2022    8:04 AM 10/10/2017   11:23 AM 10/06/2017   10:24 AM 11/04/2015   11:40 AM  Advanced Directives  Does Patient Have a Medical Advance Directive? Yes Yes No Yes No  No  No   Type of Advance Directive Living will Living will  Living will     Does patient want to make changes to medical advance directive?  No - Guardian declined       Would patient like information on creating a medical advance directive?   No - Patient declined  No - Patient declined  No - Patient declined  No - patient declined information      Data saved with a previous flowsheet row definition    Current Medications (verified) Outpatient Encounter Medications as of 08/01/2024  Medication Sig   LORazepam   (ATIVAN ) 1 MG tablet Take 1 tablet (1 mg total) by mouth 2 (two) times daily as needed for anxiety.   traMADol  (ULTRAM ) 50 MG tablet Take 1-2 tablets (50-100 mg total) by mouth every 12 (twelve) hours as needed (mild pain).   Turmeric 500 MG CAPS Take 1 tablet by mouth.   [DISCONTINUED] omega-3 acid ethyl esters (LOVAZA ) 1 g capsule TAKE 2 CAPSULES BY MOUTH 2 TIMES DAILY.   omega-3 acid ethyl esters (LOVAZA ) 1 g capsule Take 2 capsules (2 g total) by mouth 2 (two) times daily.   No facility-administered encounter medications on file as of 08/01/2024.    Allergies (verified) Penicillins   History: Past Medical History:  Diagnosis Date   Arthritis    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 Rollin)   HTN (hypertension)    Melanoma (HCC) [id number]   MM 2 CHEST TX EXC   Neuromuscular disorder (HCC)    OA (osteoarthritis) of knee 02/20/2019   SCC (squamous cell carcinoma) 05/15/2007   SCCA RIGHT FOREARM TX WITH BX   SCC (squamous cell carcinoma) 07/04/2013   WELL DIFF SCC LEFT FOREARM CX3 5FU   SCC (squamous cell carcinoma) 06/26/2014   SCC IN SITU TOP  OF SCALP TX CX3 5FU   SCC (squamous cell carcinoma) 07/27/2017   SCC IN SITU RIGHT SHOULDER TX CX3 5FU   Squamous cell carcinoma of skin 11/10/2004   SCC IN SITU RIGHT FOREARM CX3 5FU   Past Surgical History:  Procedure Laterality Date   COLONOSCOPY W/ POLYPECTOMY  approx 2007; 11/2014   Dr. 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   Procedure: LEFT TOTAL KNEE ARTHROPLASTY;  Surgeon: Melodi Lerner, MD;  Location: [facility];   Service: Orthopedics;  Laterality: Left;  50 mins   Family History  Problem Relation Age of Onset   Heart disease Father    Alcohol abuse Father    Obesity Brother    Asperger's syndrome Brother    Obesity Mother    Tremor Mother    Anxiety disorder Mother    Social History   Socioeconomic History   Marital status: Married    Spouse name: Not on file   Number of children: 1   Years of education: Not on file   Highest education level: Not on file  Occupational History   Not on file  Tobacco Use   Smoking status: Never   Smokeless tobacco: Never  Vaping Use   Vaping status: Never Used  Substance and Sexual Activity   Alcohol use: Yes    Alcohol/week: 8.0 standard drinks of alcohol    Types: 8 Glasses of wine per week    Comment: Weekend   Drug use: No   Sexual activity: Yes    Partners: Female  Other Topics Concern   Not on file  Social History Narrative   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 He lives with his wife & daughter.   Social Drivers of Corporate Investment Banker Strain: Low Risk  (08/01/2024)   Overall Financial Resource Strain (CARDIA)    Difficulty of Paying Living Expenses: Not hard at all  Food Insecurity: No Food Insecurity (08/01/2024)   Hunger Vital Sign    Worried About Running Out of Food in the Last Year: Never true    Ran Out of Food in the Last Year: Never true  Transportation Needs: No Transportation Needs (08/01/2024)   PRAPARE - Administrator, Civil Service (Medical): No    Lack of Transportation (Non-Medical): No  Physical Activity: Sufficiently Active (08/01/2024)   Exercise Vital Sign    Days of Exercise per Week: 5 days    Minutes of Exercise per Session: 90 min  Stress: No Stress Concern Present (08/01/2024)   [employer] of Occupational Health - Occupational Stress Questionnaire    Feeling of Stress: Not at all  Social Connections: Moderately Integrated (08/01/2024)   Social Connection and Isolation  Panel    Frequency of Communication with Friends and Family: Twice a week    Frequency of Social Gatherings with Friends and Family: More than three times a week    Attends Religious Services: More than 4 times per year    Active Member of [employer] or Organizations: No    Attends Banker Meetings: Never    Marital Status: Married    Tobacco Counseling Counseling given: Not Answered   Clinical Intake:  Pre-visit preparation completed: No  Pain : No/denies pain  Nutritional Risks: None  How often do you need to have someone help you when you read instructions, pamphlets, or other written materials from your doctor or pharmacy?: 1 - Never         Activities of Daily Living    08/01/2024    2:37 PM 08/01/2024    2:23 PM  In your present state of health, do you have any difficulty performing the following activities:  Hearing? 0 0  Vision? 0 0  Difficulty concentrating or making decisions? 0 0  Walking or climbing stairs? 0 0  Dressing or bathing? 0 0  Doing errands, shopping? 0 0  Preparing Food and eating ? N N  Using the Toilet? N N  In the past six months, have you accidently leaked urine? N N  Do you have problems with loss of bowel control? N N  Managing your Medications? N N  Managing your Finances? N N  Housekeeping or managing your Housekeeping? N N    Patient Care Team: Catherine Charlies LABOR, DO as PCP - General (Family Medicine) Rollin Dover, MD as Consulting Physician (Gastroenterology) Melodi Lerner, MD as Consulting Physician (Orthopedic Surgery) Carlie Clark, MD as Consulting Physician (Otolaryngology) [facility] (Inactive) Livingston Rigg, MD as Consulting Physician (Dermatology)  Indicate any recent Medical Services you may have received from other than [facility] providers in the past year (date may be approximate).     Assessment:   This is a routine wellness examination for Henry Knight.  Hearing/Vision  screen No results found.   Goals Addressed             This Visit's Progress    Patient Stated       Stay healthy       Depression Screen    08/01/2024    2:37 PM 08/01/2024    2:23 PM 10/21/2023    9:01 AM 02/02/2023    8:08 AM 12/30/2022    8:59 AM 01/27/2022    8:02 AM 06/18/2021   10:32 AM  PHQ 2/9 Scores  PHQ - 2 Score 0 0 0 0 0 0 0  PHQ- 9 Score   0    2    Fall Risk    08/01/2024    2:37 PM 08/01/2024    2:23 PM 10/21/2023    9:01 AM 02/02/2023    8:11 AM 12/30/2022    8:59 AM  Fall Risk   Falls in the past year? 0 0 0 0 0  Number falls in past yr:  0 0 0 0  Injury with Fall? 0 0 0 0 0  Risk for fall due to : No Fall Risks No Fall Risks No Fall Risks Impaired vision   Risk for fall due to: Comment    readers   Follow up Falls evaluation completed Falls evaluation completed Falls evaluation completed Falls prevention discussed Falls evaluation completed    MEDICARE RISK AT HOME: Medicare Risk at Home Any stairs in or around the home?: Yes If so, are there any without handrails?: No Home free of loose throw rugs in walkways, pet beds, electrical cords, etc?: Yes Adequate lighting in your home to reduce risk of falls?: Yes Life alert?: No Use of a cane, walker or w/c?: No Grab bars in the bathroom?: No Shower chair or bench in shower?: No Elevated toilet seat or a handicapped toilet?: No   Cognitive Function:        08/01/2024    2:41 PM 08/01/2024  2:24 PM 02/02/2023    8:12 AM 01/27/2022    8:15 AM  6CIT Screen  What Year? 0 points 0 points 0 points 0 points  What month? 0 points 0 points 0 points 0 points  What time? 0 points 0 points 0 points 0 points  Count back from 20 0 points 0 points 0 points 0 points  Months in reverse 0 points 0 points 0 points 0 points  Repeat phrase 4 points 4 points 0 points 0 points  Total Score 4 points 4 points 0 points 0 points    Immunizations Immunization History  Administered Date(s) Administered   Fluad  Quad(high Dose 65+) 07/18/2019, 06/18/2021   INFLUENZA, HIGH DOSE SEASONAL PF 08/11/2016, 07/29/2017, 07/19/2018, 07/22/2022, 08/17/2023   Influenza,inj,Quad PF,6+ Mos 07/11/2013, 08/07/2014, 07/11/2015   Influenza-Unspecified 07/27/2017, 08/02/2020   Moderna Covid-19 Fall Seasonal Vaccine 53yrs & older 09/23/2023   PFIZER Comirnaty(Gray Top)Covid-19 Tri-Sucrose Vaccine 02/16/2021   PFIZER(Purple Top)SARS-COV-2 Vaccination 10/25/2019, 11/15/2019, 07/03/2020   PNEUMOCOCCAL CONJUGATE-20 06/18/2021   Pfizer Covid-19 Vaccine Bivalent Booster 73yrs & up 07/24/2021   Pneumococcal Conjugate-13 08/11/2016, 11/16/2018   Td 03/27/2020   Tdap 06/26/2014   Unspecified SARS-COV-2 Vaccination 07/22/2022   Zoster Recombinant(Shingrix) 10/25/2021, 02/11/2022    TDAP status: Up to date  Flu Vaccine status: Due, Education has been provided regarding the importance of this vaccine. Advised may receive this vaccine at local pharmacy or Health Dept. Aware to provide a copy of the vaccination record if obtained from local pharmacy or Health Dept. Verbalized acceptance and understanding.  Pneumococcal vaccine status: Up to date  Covid-19 vaccine status: Completed vaccines  Qualifies for Shingles Vaccine? Yes   Zostavax completed No   Shingrix Completed?: Yes  Screening Tests Health Maintenance  Topic Date Due   Influenza Vaccine  01/01/2025 (Originally 05/04/2024)   Medicare Annual Wellness (AWV)  08/01/2025   Colonoscopy  08/19/2029   DTaP/Tdap/Td (3 - Td or Tdap) 03/27/2030   Pneumococcal Vaccine: 50+ Years  Completed   Hepatitis C Screening  Completed   Zoster Vaccines- Shingrix  Completed   Meningococcal B Vaccine  Aged Out   COVID-19 Vaccine  Discontinued    Health Maintenance  There are no preventive care reminders to display for this patient.   Colorectal cancer screening: Type of screening: Colonoscopy. Completed 08/19/22. Repeat every 7 years  Lung Cancer Screening: (Low Dose CT  Chest recommended if Age 35-80 years, 20 pack-year currently smoking OR have quit w/in 15years.) does not qualify.   Lung Cancer Screening Referral: n/a  Additional Screening:  Hepatitis C Screening: does qualify; Completed 05/29/2014  Vision Screening: Recommended annual ophthalmology exams for early detection of glaucoma and other disorders of the eye. Is the patient up to date with their annual eye exam?  Yes  Who is the provider or what is the name of the office in which the patient attends annual eye exams? [facility] If pt is not established with a provider, would they like to be referred to a provider to establish care? No .   Dental Screening: Recommended annual dental exams for proper oral hygiene   Community Resource Referral / Chronic Care Management: CRR required this visit?  No   CCM required this visit?  No     Plan:     I have personally reviewed and noted the following in the patient's chart:   Medical and social history Use of alcohol, tobacco or illicit drugs  Current medications and supplements including opioid prescriptions. Patient is  not currently taking opioid prescriptions. Functional ability and status Nutritional status Physical activity Advanced directives List of other physicians Hospitalizations, surgeries, and ER visits in previous 12 months Vitals Screenings to include cognitive, depression, and falls Referrals and appointments  In addition, I have reviewed and discussed with patient certain preventive protocols, quality metrics, and best practice recommendations. A written personalized care plan for preventive services as well as general preventive health recommendations were provided to patient.     Shanda LELON Sharps, CMA   08/02/2024   After Visit Summary: (Declined) Due to this being a telephonic visit, with patients personalized plan was offered to patient but patient Declined AVS at this time   Nurse Notes: Non-Face to Face or Face  to Face 10 minute visit Encounter   Henry Knight , Thank you for taking time to come for your Medicare Wellness Visit. I appreciate your ongoing commitment to your health goals. Please review the following plan we discussed and let me know if I can assist you in the future.   These are the goals we discussed:  Goals      Patient Stated     None at this time      Patient Stated     Stay healthy      Patient Stated     Stay healthy        This is a list of the screening recommended for you and due dates:  Health Maintenance  Topic Date Due   Flu Shot  01/01/2025*   Medicare Annual Wellness Visit  08/01/2025   Colon Cancer Screening  08/19/2029   DTaP/Tdap/Td vaccine (3 - Td or Tdap) 03/27/2030   Pneumococcal Vaccine for age over 71  Completed   Hepatitis C Screening  Completed   Zoster (Shingles) Vaccine  Completed   Meningitis B Vaccine  Aged Out   COVID-19 Vaccine  Discontinued  *Topic was postponed. The date shown is not the original due date.

## 2024-08-01 NOTE — Telephone Encounter (Signed)
  omega-3 acid ethyl esters (LOVAZA ) 1 g capsule    Please complete PA

## 2024-08-01 NOTE — Patient Instructions (Addendum)
Health Maintenance, Male Adopting a healthy lifestyle and getting preventive care are important in promoting health and wellness. Ask your health care provider about: The right schedule for you to have regular tests and exams. Things you can do on your own to prevent diseases and keep yourself healthy. What should I know about diet, weight, and exercise? Eat a healthy diet  Eat a diet that includes plenty of vegetables, fruits, low-fat dairy products, and lean protein. Do not eat a lot of foods that are high in solid fats, added sugars, or sodium. Maintain a healthy weight Body mass index (BMI) is a measurement that can be used to identify possible weight problems. It estimates body fat based on height and weight. Your health care provider can help determine your BMI and help you achieve or maintain a healthy weight. Get regular exercise Get regular exercise. This is one of the most important things you can do for your health. Most adults should: Exercise for at least 150 minutes each week. The exercise should increase your heart rate and make you sweat (moderate-intensity exercise). Do strengthening exercises at least twice a week. This is in addition to the moderate-intensity exercise. Spend less time sitting. Even light physical activity can be beneficial. Watch cholesterol and blood lipids Have your blood tested for lipids and cholesterol at 74 years of age, then have this test every 5 years. You may need to have your cholesterol levels checked more often if: Your lipid or cholesterol levels are high. You are older than 74 years of age. You are at high risk for heart disease. What should I know about cancer screening? Many types of cancers can be detected early and may often be prevented. Depending on your health history and family history, you may need to have cancer screening at various ages. This may include screening for: Colorectal cancer. Prostate cancer. Skin cancer. Lung  cancer. What should I know about heart disease, diabetes, and high blood pressure? Blood pressure and heart disease High blood pressure causes heart disease and increases the risk of stroke. This is more likely to develop in people who have high blood pressure readings or are overweight. Talk with your health care provider about your target blood pressure readings. Have your blood pressure checked: Every 3-5 years if you are 85-68 years of age. Every year if you are 1 years old or older. If you are between the ages of 30 and 36 and are a current or former smoker, ask your health care provider if you should have a one-time screening for abdominal aortic aneurysm (AAA). Diabetes Have regular diabetes screenings. This checks your fasting blood sugar level. Have the screening done: Once every three years after age 58 if you are at a normal weight and have a low risk for diabetes. More often and at a younger age if you are overweight or have a high risk for diabetes. What should I know about preventing infection? Hepatitis B If you have a higher risk for hepatitis B, you should be screened for this virus. Talk with your health care provider to find out if you are at risk for hepatitis B infection. Hepatitis C Blood testing is recommended for: Everyone born from [birth year] through 1966. Anyone with known risk factors for hepatitis C. Sexually transmitted infections (STIs) You should be screened each year for STIs, including gonorrhea and chlamydia, if: You are sexually active and are younger than 74 years of age. You are older than 74 years of age and your  health care provider tells you that you are at risk for this type of infection. Your sexual activity has changed since you were last screened, and you are at increased risk for chlamydia or gonorrhea. Ask your health care provider if you are at risk. Ask your health care provider about whether you are at high risk for HIV. Your health care provider  may recommend a prescription medicine to help prevent HIV infection. If you choose to take medicine to prevent HIV, you should first get tested for HIV. You should then be tested every 3 months for as long as you are taking the medicine. Follow these instructions at home: Alcohol use Do not drink alcohol if your health care provider tells you not to drink. If you drink alcohol: Limit how much you have to 0-2 drinks a day. Know how much alcohol is in your drink. In the [place] one drink equals one 12 oz bottle of beer (355 mL), one 5 oz glass of wine (148 mL), or one 1 oz glass of hard liquor (44 mL). Lifestyle Do not use any products that contain nicotine or tobacco. These products include cigarettes, chewing tobacco, and vaping devices, such as e-cigarettes. If you need help quitting, ask your health care provider. Do not use street drugs. Do not share needles. Ask your health care provider for help if you need support or information about quitting drugs. General instructions Schedule regular health, dental, and eye exams. Stay current with your vaccines. Tell your health care provider if: You often feel depressed. You have ever been abused or do not feel safe at home. Summary Adopting a healthy lifestyle and getting preventive care are important in promoting health and wellness. Follow your health care provider's instructions about healthy diet, exercising, and getting tested or screened for diseases. Follow your health care provider's instructions on monitoring your cholesterol and blood pressure. This information is not intended to replace advice given to you by your health care provider. Make sure you discuss any questions you have with your health care provider. Document Revised: 02/09/2021 Document Reviewed: 02/09/2021 Elsevier Patient Education  2024 [employer]  Health Maintenance, Male Adopting a healthy lifestyle and getting preventive care are important in promoting health  and wellness. Ask your health care provider about: The right schedule for you to have regular tests and exams. Things you can do on your own to prevent diseases and keep yourself healthy. What should I know about diet, weight, and exercise? Eat a healthy diet  Eat a diet that includes plenty of vegetables, fruits, low-fat dairy products, and lean protein. Do not eat a lot of foods that are high in solid fats, added sugars, or sodium. Maintain a healthy weight Body mass index (BMI) is a measurement that can be used to identify possible weight problems. It estimates body fat based on height and weight. Your health care provider can help determine your BMI and help you achieve or maintain a healthy weight. Get regular exercise Get regular exercise. This is one of the most important things you can do for your health. Most adults should: Exercise for at least 150 minutes each week. The exercise should increase your heart rate and make you sweat (moderate-intensity exercise). Do strengthening exercises at least twice a week. This is in addition to the moderate-intensity exercise. Spend less time sitting. Even light physical activity can be beneficial. Watch cholesterol and blood lipids Have your blood tested for lipids and cholesterol at 74 years of age, then have this  test every 5 years. You may need to have your cholesterol levels checked more often if: Your lipid or cholesterol levels are high. You are older than 74 years of age. You are at high risk for heart disease. What should I know about cancer screening? Many types of cancers can be detected early and may often be prevented. Depending on your health history and family history, you may need to have cancer screening at various ages. This may include screening for: Colorectal cancer. Prostate cancer. Skin cancer. Lung cancer. What should I know about heart disease, diabetes, and high blood pressure? Blood pressure and heart disease High  blood pressure causes heart disease and increases the risk of stroke. This is more likely to develop in people who have high blood pressure readings or are overweight. Talk with your health care provider about your target blood pressure readings. Have your blood pressure checked: Every 3-5 years if you are 40-5 years of age. Every year if you are 64 years old or older. If you are between the ages of 45 and 4 and are a current or former smoker, ask your health care provider if you should have a one-time screening for abdominal aortic aneurysm (AAA). Diabetes Have regular diabetes screenings. This checks your fasting blood sugar level. Have the screening done: Once every three years after age 13 if you are at a normal weight and have a low risk for diabetes. More often and at a younger age if you are overweight or have a high risk for diabetes. What should I know about preventing infection? Hepatitis B If you have a higher risk for hepatitis B, you should be screened for this virus. Talk with your health care provider to find out if you are at risk for hepatitis B infection. Hepatitis C Blood testing is recommended for: Everyone born from [birth year] through 1966. Anyone with known risk factors for hepatitis C. Sexually transmitted infections (STIs) You should be screened each year for STIs, including gonorrhea and chlamydia, if: You are sexually active and are younger than 74 years of age. You are older than 74 years of age and your health care provider tells you that you are at risk for this type of infection. Your sexual activity has changed since you were last screened, and you are at increased risk for chlamydia or gonorrhea. Ask your health care provider if you are at risk. Ask your health care provider about whether you are at high risk for HIV. Your health care provider may recommend a prescription medicine to help prevent HIV infection. If you choose to take medicine to prevent HIV, you  should first get tested for HIV. You should then be tested every 3 months for as long as you are taking the medicine. Follow these instructions at home: Alcohol use Do not drink alcohol if your health care provider tells you not to drink. If you drink alcohol: Limit how much you have to 0-2 drinks a day. Know how much alcohol is in your drink. In the [place] one drink equals one 12 oz bottle of beer (355 mL), one 5 oz glass of wine (148 mL), or one 1 oz glass of hard liquor (44 mL). Lifestyle Do not use any products that contain nicotine or tobacco. These products include cigarettes, chewing tobacco, and vaping devices, such as e-cigarettes. If you need help quitting, ask your health care provider. Do not use street drugs. Do not share needles. Ask your health care provider for help if you need  support or information about quitting drugs. General instructions Schedule regular health, dental, and eye exams. Stay current with your vaccines. Tell your health care provider if: You often feel depressed. You have ever been abused or do not feel safe at home. Summary Adopting a healthy lifestyle and getting preventive care are important in promoting health and wellness. Follow your health care provider's instructions about healthy diet, exercising, and getting tested or screened for diseases. Follow your health care provider's instructions on monitoring your cholesterol and blood pressure. This information is not intended to replace advice given to you by your health care provider. Make sure you discuss any questions you have with your health care provider. Document Revised: 02/09/2021 Document Reviewed: 02/09/2021 Elsevier Patient Education  2024 [employer].

## 2024-08-02 ENCOUNTER — Encounter: Admitting: Family Medicine

## 2024-08-02 ENCOUNTER — Other Ambulatory Visit (HOSPITAL_COMMUNITY): Payer: Self-pay

## 2024-08-02 ENCOUNTER — Telehealth: Payer: Self-pay

## 2024-08-02 NOTE — Telephone Encounter (Signed)
 noted

## 2024-08-02 NOTE — Telephone Encounter (Signed)
 Pharmacy Patient Advocate Encounter  Insurance verification completed.   The patient is insured through [facility]   Ran test claim for omega-3 acid ethyl esters (LOVAZA ) 1 g capsule. Currently a quantity of 360 is a 90 day supply is TO SOON LAST FILL ON 08/01/2024 NEXT FILL IS 10/08/2024  No PA needed AT THIS TIME PRIOR AUTHORIZATION EXPIRES 10/03/2024  This test claim was processed through [facility]- copay amounts may vary at other pharmacies due to pharmacy/plan contracts, or as the patient moves through the different stages of their insurance plan.

## 2024-08-03 NOTE — Progress Notes (Signed)
 Opened in error

## 2024-08-15 DIAGNOSIS — Z08 Encounter for follow-up examination after completed treatment for malignant neoplasm: Secondary | ICD-10-CM | POA: Diagnosis not present

## 2024-08-15 DIAGNOSIS — L814 Other melanin hyperpigmentation: Secondary | ICD-10-CM | POA: Diagnosis not present

## 2024-08-15 DIAGNOSIS — Z85828 Personal history of other malignant neoplasm of skin: Secondary | ICD-10-CM | POA: Diagnosis not present

## 2024-08-15 DIAGNOSIS — L570 Actinic keratosis: Secondary | ICD-10-CM | POA: Diagnosis not present

## 2024-08-15 DIAGNOSIS — L918 Other hypertrophic disorders of the skin: Secondary | ICD-10-CM | POA: Diagnosis not present

## 2024-08-15 DIAGNOSIS — L821 Other seborrheic keratosis: Secondary | ICD-10-CM | POA: Diagnosis not present

## 2024-08-15 DIAGNOSIS — D1801 Hemangioma of skin and subcutaneous tissue: Secondary | ICD-10-CM | POA: Diagnosis not present

## 2024-08-17 DIAGNOSIS — M25561 Pain in right knee: Secondary | ICD-10-CM | POA: Diagnosis not present

## 2024-08-17 DIAGNOSIS — M25562 Pain in left knee: Secondary | ICD-10-CM | POA: Diagnosis not present

## 2024-09-05 ENCOUNTER — Encounter: Payer: Self-pay | Admitting: Family Medicine

## 2024-09-05 ENCOUNTER — Ambulatory Visit: Admitting: Family Medicine

## 2024-09-05 VITALS — BP 128/78 | HR 63 | Temp 98.2°F | Wt 234.8 lb

## 2024-09-05 DIAGNOSIS — I83813 Varicose veins of bilateral lower extremities with pain: Secondary | ICD-10-CM | POA: Insufficient documentation

## 2024-09-05 DIAGNOSIS — J209 Acute bronchitis, unspecified: Secondary | ICD-10-CM

## 2024-09-05 MED ORDER — DOXYCYCLINE HYCLATE 100 MG PO TABS: 100.0000 mg | ORAL_TABLET | Freq: Two times a day (BID) | ORAL | 0 refills | Status: DC

## 2024-09-05 MED ORDER — OMEGA-3-ACID ETHYL ESTERS 1 G PO CAPS: 2.0000 g | ORAL_CAPSULE | Freq: Two times a day (BID) | ORAL | 0 refills | Status: DC

## 2024-09-05 NOTE — Patient Instructions (Addendum)

## 2024-09-05 NOTE — Progress Notes (Signed)
 Henry Knight , 1950-10-16, 73 y.o., male MRN: [id number] Patient Care Team    Relationship Specialty Notifications Start End  Catherine Charlies LABOR, DO PCP - General Family Medicine  07/11/15   Rollin Dover, MD Consulting Physician Gastroenterology  08/11/16   Melodi Lerner, MD Consulting Physician Orthopedic Surgery  08/11/16   Carlie Clark, MD Consulting Physician Otolaryngology  08/11/16   Sciences, [facility] (Inactive)    10/13/16    Comment: plastic and reconstructiion [facility] (on [address] in [place])  Livingston Rigg, MD Consulting Physician Dermatology  06/30/17     Chief Complaint  Patient presents with   Vein Issue     Since Monday; L leg. Soreness, tender to the touch.       Subjective: Henry Knight is a 73 y.o. Pt presents for an OV with complaints of left lower ext. Vein tenderness and enlargement of 3 days duration.  Associated symptoms include pain.  He denies any redness.  Also acute concern of upper resp congestion started > 1 week ago, now in his chest. He tested negative for covid at home.  He endorses lack of appetite.  Denies fevers or chills.  (See ROS)     09/05/2024    9:20 AM 08/01/2024    2:37 PM 08/01/2024    2:23 PM 10/21/2023    9:01 AM 02/02/2023    8:08 AM  Depression screen PHQ 2/9  Decreased Interest 0 0 0 0 0  Down, Depressed, Hopeless 0 0 0 0 0  PHQ - 2 Score 0 0 0 0 0  Altered sleeping 1   0   Tired, decreased energy 1   0   Change in appetite 0   0   Feeling bad or failure about yourself  0   0   Trouble concentrating 0   0   Moving slowly or fidgety/restless 0   0   Suicidal thoughts 0   0   PHQ-9 Score 2   0    Difficult doing work/chores Not difficult at all   Not difficult at all      Data saved with a previous flowsheet row definition    Allergies  Allergen Reactions   Penicillins Rash and Other (See Comments)    Has patient had a PCN reaction causing immediate rash, facial/tongue/throat swelling, SOB or  lightheadedness with hypotension: Unknown Has patient had a PCN reaction causing severe rash involving mucus membranes or skin necrosis: No Has patient had a PCN reaction that required hospitalization: No Has patient had a PCN reaction occurring within the last 10 years: No If all of the above answers are NO, then may proceed with Cephalosporin use.    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 He lives with his wife & daughter.   Past Medical History:  Diagnosis Date   Arthritis    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 Rollin)   HTN (hypertension)    Melanoma (HCC) [id number]   MM 2 CHEST TX EXC   Neuromuscular disorder (HCC)    OA (osteoarthritis) of knee 02/20/2019   SCC (squamous cell carcinoma) 05/15/2007   SCCA RIGHT FOREARM TX  WITH BX   SCC (squamous cell carcinoma) 07/04/2013   WELL DIFF SCC LEFT FOREARM CX3 5FU   SCC (squamous cell carcinoma) 06/26/2014   SCC IN SITU TOP OF SCALP TX CX3 5FU   SCC (squamous cell carcinoma) 07/27/2017   SCC IN SITU RIGHT SHOULDER TX CX3 5FU   Squamous cell carcinoma of skin 11/10/2004   SCC IN SITU RIGHT FOREARM CX3 5FU   Past Surgical History:  Procedure Laterality Date   COLONOSCOPY W/ POLYPECTOMY  approx 2007; 11/2014   Dr. 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   Procedure: LEFT TOTAL KNEE ARTHROPLASTY;  Surgeon: Melodi Lerner, MD;  Location: [facility];  Service: Orthopedics;  Laterality: Left;  50 mins   Family History  Problem Relation Age of Onset   Heart disease Father     Alcohol abuse Father    Obesity Brother    Asperger's syndrome Brother    Obesity Mother    Tremor Mother    Anxiety disorder Mother    Allergies as of 09/05/2024       Reactions   Penicillins Rash, Other (See Comments)   Has patient had a PCN reaction causing immediate rash, facial/tongue/throat swelling, SOB or lightheadedness with hypotension: Unknown Has patient had a PCN reaction causing severe rash involving mucus membranes or skin necrosis: No Has patient had a PCN reaction that required hospitalization: No Has patient had a PCN reaction occurring within the last 10 years: No If all of the above answers are NO, then may proceed with Cephalosporin use.        Medication List        Accurate as of September 05, 2024  3:32 PM. If you have any questions, ask your nurse or doctor.          doxycycline  100 MG tablet Commonly known as: VIBRA -TABS Take 1 tablet (100 mg total) by mouth 2 (two) times daily. Started by: [employer]   LORazepam  1 MG tablet Commonly known as: ATIVAN  Take 1 tablet (1 mg total) by mouth 2 (two) times daily as needed for anxiety.   omega-3 acid ethyl esters 1 g capsule Commonly known as: LOVAZA  Take 2 capsules (2 g total) by mouth 2 (two) times daily.   traMADol  50 MG tablet Commonly known as: ULTRAM  Take 1-2 tablets (50-100 mg total) by mouth every 12 (twelve) hours as needed (mild pain).   Turmeric 500 MG Caps Take 1 tablet by mouth.        All past medical history, surgical history, allergies, family history, immunizations andmedications were updated in the EMR today and reviewed under the history and medication portions of their EMR.     Review of Systems  Constitutional:  Negative for chills, fever and malaise/fatigue.  HENT:  Positive for congestion. Negative for ear discharge, ear pain, sinus pain and sore throat.   Respiratory:  Positive for shortness of breath. Negative for cough, sputum production and wheezing.    Gastrointestinal:  Positive for nausea. Negative for constipation, diarrhea and vomiting.  Skin:  Negative for rash.   Negative, with the exception of above mentioned in HPI   Objective:  BP 128/78   Pulse 63   Temp 98.2 F (36.8 C)   Wt 234 lb 12.8 oz (  106.5 kg)   SpO2 97%   BMI 31.84 kg/m  Body mass index is 31.84 kg/m. Physical Exam Vitals and nursing note reviewed. Exam conducted with a chaperone present.  Constitutional:      General: He is not in acute distress.    Appearance: Normal appearance. He is not ill-appearing, toxic-appearing or diaphoretic.  HENT:     Head: Normocephalic and atraumatic.     Right Ear: Tympanic membrane, ear canal and external ear normal. There is no impacted cerumen.     Left Ear: Tympanic membrane, ear canal and external ear normal. There is no impacted cerumen.     Nose: Congestion and rhinorrhea present.     Right Sinus: No maxillary sinus tenderness or frontal sinus tenderness.     Left Sinus: No maxillary sinus tenderness or frontal sinus tenderness.     Mouth/Throat:     Mouth: Mucous membranes are moist.     Pharynx: Postnasal drip present. No pharyngeal swelling, oropharyngeal exudate or posterior oropharyngeal erythema.  Eyes:     General: No scleral icterus.       Right eye: No discharge.        Left eye: No discharge.     Extraocular Movements: Extraocular movements intact.     Pupils: Pupils are equal, round, and reactive to light.  Cardiovascular:     Rate and Rhythm: Normal rate and regular rhythm.  Pulmonary:     Effort: Pulmonary effort is normal. No respiratory distress.     Breath sounds: Rhonchi present. No wheezing or rales.  Musculoskeletal:     Cervical back: Neck supple.     Left lower leg: Tenderness present.       Legs:     Comments: Significantly enlarged varicosity left lower extremity starting at medial aspect of knee and extending to mid shin.  No erythema.  Mild tenderness present.  Lymphadenopathy:      Cervical: No cervical adenopathy.  Skin:    General: Skin is warm and dry.     Coloration: Skin is not jaundiced or pale.     Findings: No erythema or rash.  Neurological:     Mental Status: He is alert and oriented to person, place, and time. Mental status is at baseline.  Psychiatric:        Mood and Affect: Mood normal.        Behavior: Behavior normal.        Thought Content: Thought content normal.        Judgment: Judgment normal.     No results found. No results found. No results found for this or any previous visit (from the past 24 hours).  Assessment/Plan: Henry Knight is a 73 y.o. male present for OV for  Varicose veins of both lower extremities with pain (Primary) Patient has a history of varicose veins that have never bothered him before.  As of recent his left lower extremity varicosity has significantly enlarged.  There is no erythema present.  He is mildly tender over the varicosity.   He is leaving to go out of town tomorrow.  I have advised him to make sure he takes frequent breaks during travel, getting up and walking. - Ambulatory referral to Vascular Surgery - Compression stockings prescribed urged him to get this prescription filled today so he can wear his compression stockings ASAP, especially when traveling. Patient was strongly encouraged to keep his leg elevated when at all possible. Start ASA 81   Acute bronchitis with symptoms >  10 days Rest.  Hydrate. Doxycycline  twice daily prescribed Mucinex can be helpful. Follow-up when 2 weeks if symptoms are not improving or sooner if worsening   Reviewed expectations re: course of current medical issues. Discussed self-management of symptoms. Outlined signs and symptoms indicating need for more acute intervention. Patient verbalized understanding and all questions were answered. Patient received an After-Visit Summary.    Orders Placed This Encounter  Procedures   Compression stockings    Ambulatory referral to Vascular Surgery   Meds ordered this encounter  Medications   doxycycline  (VIBRA -TABS) 100 MG tablet    Sig: Take 1 tablet (100 mg total) by mouth 2 (two) times daily.    Dispense:  14 tablet    Refill:  0   omega-3 acid ethyl esters (LOVAZA ) 1 g capsule    Sig: Take 2 capsules (2 g total) by mouth 2 (two) times daily.    Dispense:  360 capsule    Refill:  0   Referral Orders         Ambulatory referral to Vascular Surgery       Note is dictated utilizing voice recognition software. Although note has been proof read prior to signing, occasional typographical errors still can be missed. If any questions arise, please do not hesitate to call for verification.   electronically signed by:  Charlies Bellini, DO  [facility] [place]

## 2024-09-11 ENCOUNTER — Encounter (HOSPITAL_BASED_OUTPATIENT_CLINIC_OR_DEPARTMENT_OTHER): Payer: Self-pay

## 2024-09-11 ENCOUNTER — Other Ambulatory Visit (HOSPITAL_COMMUNITY): Payer: Self-pay

## 2024-09-11 ENCOUNTER — Emergency Department (HOSPITAL_BASED_OUTPATIENT_CLINIC_OR_DEPARTMENT_OTHER)

## 2024-09-11 ENCOUNTER — Other Ambulatory Visit: Payer: Self-pay

## 2024-09-11 ENCOUNTER — Telehealth (HOSPITAL_COMMUNITY): Payer: Self-pay

## 2024-09-11 ENCOUNTER — Observation Stay (HOSPITAL_BASED_OUTPATIENT_CLINIC_OR_DEPARTMENT_OTHER)
Admission: EM | Admit: 2024-09-11 | Discharge: 2024-09-14 | DRG: 291 | Disposition: A | Attending: Emergency Medicine | Admitting: Emergency Medicine

## 2024-09-11 DIAGNOSIS — I4891 Unspecified atrial fibrillation: Secondary | ICD-10-CM | POA: Diagnosis not present

## 2024-09-11 DIAGNOSIS — E785 Hyperlipidemia, unspecified: Secondary | ICD-10-CM | POA: Diagnosis not present

## 2024-09-11 DIAGNOSIS — I4892 Unspecified atrial flutter: Secondary | ICD-10-CM

## 2024-09-11 DIAGNOSIS — I509 Heart failure, unspecified: Secondary | ICD-10-CM | POA: Diagnosis not present

## 2024-09-11 DIAGNOSIS — G250 Essential tremor: Secondary | ICD-10-CM | POA: Diagnosis not present

## 2024-09-11 DIAGNOSIS — R059 Cough, unspecified: Secondary | ICD-10-CM | POA: Diagnosis not present

## 2024-09-11 DIAGNOSIS — Z79899 Other long term (current) drug therapy: Secondary | ICD-10-CM | POA: Diagnosis not present

## 2024-09-11 DIAGNOSIS — F4010 Social phobia, unspecified: Secondary | ICD-10-CM | POA: Diagnosis present

## 2024-09-11 DIAGNOSIS — R7989 Other specified abnormal findings of blood chemistry: Secondary | ICD-10-CM | POA: Diagnosis not present

## 2024-09-11 DIAGNOSIS — E781 Pure hyperglyceridemia: Secondary | ICD-10-CM | POA: Diagnosis present

## 2024-09-11 DIAGNOSIS — I10 Essential (primary) hypertension: Secondary | ICD-10-CM | POA: Diagnosis present

## 2024-09-11 DIAGNOSIS — R079 Chest pain, unspecified: Secondary | ICD-10-CM | POA: Diagnosis not present

## 2024-09-11 DIAGNOSIS — R918 Other nonspecific abnormal finding of lung field: Secondary | ICD-10-CM | POA: Diagnosis not present

## 2024-09-11 LAB — COMPREHENSIVE METABOLIC PANEL WITH GFR
ALT: 51 U/L — ABNORMAL HIGH (ref 0–44)
AST: 33 U/L (ref 15–41)
Albumin: 4.5 g/dL (ref 3.5–5.0)
Alkaline Phosphatase: 81 U/L (ref 38–126)
Anion gap: 12 (ref 5–15)
BUN: 23 mg/dL (ref 8–23)
CO2: 20 mmol/L — ABNORMAL LOW (ref 22–32)
Calcium: 9.2 mg/dL (ref 8.9–10.3)
Chloride: 106 mmol/L (ref 98–111)
Creatinine, Ser: 0.93 mg/dL (ref 0.61–1.24)
GFR, Estimated: >60 mL/min (ref >60)
Glucose, Bld: 121 mg/dL — ABNORMAL HIGH (ref 70–99)
Potassium: 4.5 mmol/L (ref 3.5–5.1)
Sodium: 138 mmol/L (ref 135–145)
Total Bilirubin: 0.8 mg/dL (ref 0.0–1.2)
Total Protein: 7.2 g/dL (ref 6.5–8.1)

## 2024-09-11 LAB — CBC
HCT: 44.9 % (ref 39.0–52.0)
Hemoglobin: 15.9 g/dL (ref 13.0–17.0)
MCH: 33.1 pg (ref 26.0–34.0)
MCHC: 35.4 g/dL (ref 30.0–36.0)
MCV: 93.3 fL (ref 80.0–100.0)
Platelets: 238 K/uL (ref 150–400)
RBC: 4.81 MIL/uL (ref 4.22–5.81)
RDW: 12.2 % (ref 11.5–15.5)
WBC: 7.2 K/uL (ref 4.0–10.5)
nRBC: 0 % (ref 0.0–0.2)

## 2024-09-11 LAB — PROCALCITONIN: Procalcitonin: <0.1 ng/mL

## 2024-09-11 LAB — MAGNESIUM: Magnesium: 2 mg/dL (ref 1.7–2.4)

## 2024-09-11 LAB — TROPONIN T, HIGH SENSITIVITY
Troponin T High Sensitivity: 43 ng/L — ABNORMAL HIGH (ref 0–19)
Troponin T High Sensitivity: 39 ng/L — ABNORMAL HIGH (ref 0–19)

## 2024-09-11 LAB — TSH: TSH: 4.98 u[IU]/mL — ABNORMAL HIGH (ref 0.350–4.500)

## 2024-09-11 LAB — RESP PANEL BY RT-PCR (RSV, FLU A&B, COVID)  RVPGX2
Influenza A by PCR: NEGATIVE
Influenza B by PCR: NEGATIVE
Resp Syncytial Virus by PCR: NEGATIVE
SARS Coronavirus 2 by RT PCR: NEGATIVE

## 2024-09-11 LAB — D-DIMER, QUANTITATIVE: D-Dimer, Quant: 1.09 ug{FEU}/mL — ABNORMAL HIGH (ref 0.00–0.50)

## 2024-09-11 LAB — CBG MONITORING, ED: Glucose-Capillary: 121 mg/dL — ABNORMAL HIGH (ref 70–99)

## 2024-09-11 LAB — PRO BRAIN NATRIURETIC PEPTIDE: Pro Brain Natriuretic Peptide: 1971 pg/mL — ABNORMAL HIGH (ref <300.0)

## 2024-09-11 MED ORDER — ATORVASTATIN CALCIUM 40 MG PO TABS
40.0000 mg | ORAL_TABLET | Freq: Every day | ORAL | Status: DC
  Administered 2024-09-11 – 2024-09-14 (×4): 40 mg via ORAL
  Filled 2024-09-11 (×4): qty 1

## 2024-09-11 MED ORDER — HEPARIN BOLUS VIA INFUSION
5500.0000 [IU] | Freq: Once | INTRAVENOUS | Status: AC
  Administered 2024-09-11: 5500 [IU] via INTRAVENOUS

## 2024-09-11 MED ORDER — METOPROLOL TARTRATE 5 MG/5ML IV SOLN: 5.0000 mg | Freq: Three times a day (TID) | INTRAVENOUS | Status: DC | PRN

## 2024-09-11 MED ORDER — ACETAMINOPHEN 325 MG PO TABS: 650.0000 mg | ORAL_TABLET | Freq: Four times a day (QID) | ORAL | Status: DC | PRN

## 2024-09-11 MED ORDER — APIXABAN 5 MG PO TABS
5.0000 mg | ORAL_TABLET | Freq: Two times a day (BID) | ORAL | Status: DC
  Administered 2024-09-11 (×2): 5 mg via ORAL
  Filled 2024-09-11: qty 2
  Filled 2024-09-11: qty 1

## 2024-09-11 MED ORDER — SENNOSIDES-DOCUSATE SODIUM 8.6-50 MG PO TABS
1.0000 | ORAL_TABLET | Freq: Every evening | ORAL | Status: DC | PRN
  Administered 2024-09-13: 1 via ORAL
  Filled 2024-09-11: qty 1

## 2024-09-11 MED ORDER — AMIODARONE LOAD VIA INFUSION
150.0000 mg | Freq: Once | INTRAVENOUS | Status: AC
  Administered 2024-09-11: 150 mg via INTRAVENOUS
  Filled 2024-09-11: qty 83.34

## 2024-09-11 MED ORDER — SODIUM CHLORIDE 0.45 % IV SOLN: INTRAVENOUS | Status: DC

## 2024-09-11 MED ORDER — LORAZEPAM 1 MG PO TABS: 1.0000 mg | ORAL_TABLET | Freq: Two times a day (BID) | ORAL | Status: DC | PRN

## 2024-09-11 MED ORDER — ONDANSETRON HCL 4 MG/2ML IJ SOLN: 4.0000 mg | Freq: Four times a day (QID) | INTRAMUSCULAR | Status: DC | PRN

## 2024-09-11 MED ORDER — ONDANSETRON HCL 4 MG PO TABS: 4.0000 mg | ORAL_TABLET | Freq: Four times a day (QID) | ORAL | Status: DC | PRN

## 2024-09-11 MED ORDER — AMIODARONE HCL IN DEXTROSE 360-4.14 MG/200ML-% IV SOLN
60.0000 mg/h | INTRAVENOUS | Status: DC
  Administered 2024-09-11: 60 mg/h via INTRAVENOUS
  Filled 2024-09-11 (×2): qty 200

## 2024-09-11 MED ORDER — FUROSEMIDE 10 MG/ML IJ SOLN
40.0000 mg | Freq: Once | INTRAMUSCULAR | Status: AC
  Administered 2024-09-11: 40 mg via INTRAVENOUS
  Filled 2024-09-11: qty 4

## 2024-09-11 MED ORDER — ONDANSETRON HCL 4 MG/2ML IJ SOLN
4.0000 mg | Freq: Once | INTRAMUSCULAR | Status: AC
  Administered 2024-09-11: 4 mg via INTRAVENOUS
  Filled 2024-09-11: qty 2

## 2024-09-11 MED ORDER — HEPARIN (PORCINE) 25000 UT/250ML-% IV SOLN
1400.0000 [IU]/h | INTRAVENOUS | Status: DC
  Administered 2024-09-11: 1400 [IU]/h via INTRAVENOUS
  Filled 2024-09-11: qty 250

## 2024-09-11 MED ORDER — METOPROLOL TARTRATE 25 MG PO TABS
25.0000 mg | ORAL_TABLET | Freq: Once | ORAL | Status: AC
  Administered 2024-09-11: 25 mg via ORAL
  Filled 2024-09-11: qty 1

## 2024-09-11 MED ORDER — METOPROLOL TARTRATE 25 MG PO TABS
25.0000 mg | ORAL_TABLET | Freq: Two times a day (BID) | ORAL | Status: DC
  Administered 2024-09-11: 25 mg via ORAL
  Filled 2024-09-11: qty 1

## 2024-09-11 MED ORDER — IOHEXOL 350 MG/ML SOLN
100.0000 mL | Freq: Once | INTRAVENOUS | Status: AC | PRN
  Administered 2024-09-11: 100 mL via INTRAVENOUS

## 2024-09-11 MED ORDER — METOPROLOL TARTRATE 50 MG PO TABS: 50.0000 mg | ORAL_TABLET | Freq: Two times a day (BID) | ORAL | Status: DC

## 2024-09-11 MED ORDER — ACETAMINOPHEN 650 MG RE SUPP: 650.0000 mg | Freq: Four times a day (QID) | RECTAL | Status: DC | PRN

## 2024-09-11 MED ORDER — AMIODARONE HCL IN DEXTROSE 360-4.14 MG/200ML-% IV SOLN
30.0000 mg/h | INTRAVENOUS | Status: DC
  Administered 2024-09-11: 30 mg/h via INTRAVENOUS

## 2024-09-11 NOTE — Progress Notes (Addendum)
 PHARMACY - ANTICOAGULATION CONSULT NOTE  Pharmacy Consult for heparin  Indication: atrial fibrillation  Allergies  Allergen Reactions   Penicillins Rash and Other (See Comments)    Has patient had a PCN reaction causing immediate rash, facial/tongue/throat swelling, SOB or lightheadedness with hypotension: Unknown Has patient had a PCN reaction causing severe rash involving mucus membranes or skin necrosis: No Has patient had a PCN reaction that required hospitalization: No Has patient had a PCN reaction occurring within the last 10 years: No If all of the above answers are NO, then may proceed with Cephalosporin use.     Patient Measurements: Height: 6' (182.9 cm) Weight: 106.6 kg (235 lb) IBW/kg (Calculated) : 77.6 HEPARIN  DW (KG): 99.9  Vital Signs: Temp: 97.8 F (36.6 C) (12/09 0743) BP: 146/111 (12/09 1000) Pulse Rate: 122 (12/09 1000)  Labs: Recent Labs    09/11/24 0757  HGB 15.9  HCT 44.9  PLT 238  CREATININE 0.93    Estimated Creatinine Clearance: 87.9 mL/min (by C-G formula based on SCr of 0.93 mg/dL).   Medical History: Past Medical History:  Diagnosis Date   Arthritis    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 Rollin)   HTN (hypertension)    Melanoma (HCC) [id number]   MM 2 CHEST TX EXC   Neuromuscular disorder (HCC)    OA (osteoarthritis) of knee 02/20/2019   SCC (squamous cell carcinoma) 05/15/2007   SCCA RIGHT FOREARM TX WITH BX   SCC (squamous cell carcinoma) 07/04/2013   WELL DIFF SCC LEFT FOREARM CX3 5FU   SCC (squamous cell carcinoma) 06/26/2014   SCC IN SITU TOP OF SCALP TX CX3 5FU   SCC (squamous cell carcinoma) 07/27/2017   SCC IN SITU RIGHT SHOULDER TX CX3 5FU   Squamous cell carcinoma of skin 11/10/2004   SCC IN SITU RIGHT  FOREARM CX3 5FU    Medications:  Infusions:   amiodarone      Followed by   amiodarone      heparin       Assessment: Henry Knight presented to the ED with ShOB and found to be in afib. Now starting IV heparin . Baseline CBC is WNL and he is not on anticoagulation PTA.   Goal of Therapy:  Heparin  level 0.3-0.7 units/ml Monitor platelets by anticoagulation protocol: Yes   Plan:  Heparin  bolus 5500 units IV x 1 Heparin  gtt 1400 units/hr Check an 8 hr heparin  level Daily heparin  level and CBC  Sriyan Cutting, Vernell Helling 09/11/2024,10:41 AM  Addendum: Now switching to apixaba. Stop heparin  and start apixaban  5mg  PO BID.  Vernell Meier, PharmD, BCPS, BCEMP Clinical Pharmacist Please see AMION for all pharmacy numbers 09/11/2024 11:21 AM

## 2024-09-11 NOTE — Assessment & Plan Note (Signed)
Continue with as needed lorazepam 

## 2024-09-11 NOTE — Hospital Course (Signed)
 Mr. Coldwell was admitted to the hospital with the working diagnosis of heart failure exacerbation, complicated with atrial fibrillation.   73 yo male with the past medical history of pulmonary hypertension who presented with dyspnea. Positive symptoms for the last 2 weeks prior to admission, with worsening dyspnea over the last 24 hrs prior to admission, associated with orthopnea and PND. On hs initial physical examination his blood pressure was 149/99. 169/115, HR 122, RR 23 and 02 saturation 94% Lungs with rales bilaterally with no wheezing or rhonchi, heart with S1 and S2 present and tachycardic, irregularly irregular, abdomen with no distention, no lower extremity edema.   Na 138, K 4.5 Cl 106 bicarbonate 20, glucose 121, bun 23 cr 0,93  BNP 1,971 High sensitive troponin 43 and 39  Wbc 7.2 hgb 15,9 plt 238  D dimer 1,0  Sars covid 19 negative Influenza negative RSV negative   Chest radiograph with cardiomegaly, bilateral hilar vascular congestion, with bilateral central interstitial infiltrates, small bilateral pleural effusions.   EKG 123 bpm, left axis deviation, left anterior fascicular block, right bundle branch block, qtc 564, atrial flutter with PVC, with no significant ST segment or T wave changes.   CT chest with no evidence of acute pulmonary embolus.  Cardiomegaly with vascular congestion and diffuse interlobular septal thickening, small to moderate right pleural effusion and small left pleural effusions.  Enlargement of the pulmonary outflow tract.  Small non obstructing stone interpolar left kidney.    Patient placed on amiodarone  and IV furosemide .  12/10 Echocardiogram with biventricular failure, postponed direct current cardioversion.  Consulted heart failure team.  12/11 improved volume status, plan for DC cardioversion with conversion to sinus rhythm.  12/12 clinically with euvolemic state, plan to continue oral amiodarone  load as outpatient.

## 2024-09-11 NOTE — Progress Notes (Signed)
 Yellow MEWS implemented

## 2024-09-11 NOTE — ED Notes (Addendum)
 Pt on video call with hospitalist, Amy Cox

## 2024-09-11 NOTE — Telephone Encounter (Signed)
 Pharmacy Patient Advocate Encounter  Insurance verification completed.    The patient is insured through [employer]. Patient has Medicare and is not eligible for a copay card, but may be able to apply for patient assistance or Medicare RX Payment Plan (Patient Must reach out to their plan, if eligible for payment plan), if available.    Ran test claim for Eliquis  5mg  tablet and the current 30 day co-pay is $152.71.   This test claim was processed through [facility]- copay amounts may vary at other pharmacies due to pharmacy/plan contracts, or as the patient moves through the different stages of their insurance plan.

## 2024-09-11 NOTE — Assessment & Plan Note (Signed)
 PDMP reviewed. Current active prescriptions included lorazepam  1 mg tablet, 180 tablets written on 03/28/24 and filled on 07/12/24.

## 2024-09-11 NOTE — Plan of Care (Signed)
  Problem: Education: Goal: Knowledge of General Education information will improve Description: Including pain rating scale, medication(s)/side effects and non-pharmacologic comfort measures 09/11/2024 1653 by Emil Roselie SAUNDERS, RN Outcome: Progressing 09/11/2024 1653 by Emil Roselie SAUNDERS, RN Outcome: Progressing   Problem: Health Behavior/Discharge Planning: Goal: Ability to manage health-related needs will improve 09/11/2024 1653 by Emil Roselie SAUNDERS, RN Outcome: Progressing 09/11/2024 1653 by Emil Roselie SAUNDERS, RN Outcome: Progressing   Problem: Clinical Measurements: Goal: Ability to maintain clinical measurements within normal limits will improve 09/11/2024 1653 by Emil Roselie SAUNDERS, RN Outcome: Progressing 09/11/2024 1653 by Emil Roselie SAUNDERS, RN Outcome: Progressing Goal: Will remain free from infection 09/11/2024 1653 by Emil Roselie SAUNDERS, RN Outcome: Progressing 09/11/2024 1653 by Emil Roselie SAUNDERS, RN Outcome: Progressing Goal: Diagnostic test results will improve 09/11/2024 1653 by Emil Roselie SAUNDERS, RN Outcome: Progressing 09/11/2024 1653 by Emil Roselie SAUNDERS, RN Outcome: Progressing Goal: Respiratory complications will improve 09/11/2024 1653 by Emil Roselie SAUNDERS, RN Outcome: Progressing 09/11/2024 1653 by Emil Roselie SAUNDERS, RN Outcome: Progressing Goal: Cardiovascular complication will be avoided 09/11/2024 1653 by Emil Roselie SAUNDERS, RN Outcome: Progressing 09/11/2024 1653 by Emil Roselie SAUNDERS, RN Outcome: Progressing   Problem: Activity: Goal: Risk for activity intolerance will decrease 09/11/2024 1653 by Emil Roselie SAUNDERS, RN Outcome: Progressing 09/11/2024 1653 by Emil Roselie SAUNDERS, RN Outcome: Progressing   Problem: Nutrition: Goal: Adequate nutrition will be maintained 09/11/2024 1653 by Emil Roselie SAUNDERS, RN Outcome: Progressing 09/11/2024 1653 by Emil Roselie SAUNDERS, RN Outcome: Progressing   Problem: Coping: Goal: Level of anxiety will  decrease 09/11/2024 1653 by Emil Roselie SAUNDERS, RN Outcome: Progressing 09/11/2024 1653 by Emil Roselie SAUNDERS, RN Outcome: Progressing   Problem: Elimination: Goal: Will not experience complications related to bowel motility 09/11/2024 1653 by Emil Roselie SAUNDERS, RN Outcome: Progressing 09/11/2024 1653 by Emil Roselie SAUNDERS, RN Outcome: Progressing Goal: Will not experience complications related to urinary retention 09/11/2024 1653 by Emil Roselie SAUNDERS, RN Outcome: Progressing 09/11/2024 1653 by Emil Roselie SAUNDERS, RN Outcome: Progressing   Problem: Pain Managment: Goal: General experience of comfort will improve and/or be controlled 09/11/2024 1653 by Emil Roselie SAUNDERS, RN Outcome: Progressing 09/11/2024 1653 by Emil Roselie SAUNDERS, RN Outcome: Progressing   Problem: Safety: Goal: Ability to remain free from injury will improve 09/11/2024 1653 by Emil Roselie SAUNDERS, RN Outcome: Progressing 09/11/2024 1653 by Emil Roselie SAUNDERS, RN Outcome: Progressing   Problem: Skin Integrity: Goal: Risk for impaired skin integrity will decrease 09/11/2024 1653 by Emil Roselie SAUNDERS, RN Outcome: Progressing 09/11/2024 1653 by Emil Roselie SAUNDERS, RN Outcome: Progressing   Problem: Education: Goal: Knowledge of disease or condition will improve 09/11/2024 1653 by Emil Roselie SAUNDERS, RN Outcome: Progressing 09/11/2024 1653 by Emil Roselie SAUNDERS, RN Outcome: Progressing Goal: Understanding of medication regimen will improve 09/11/2024 1653 by Emil Roselie SAUNDERS, RN Outcome: Progressing 09/11/2024 1653 by Emil Roselie SAUNDERS, RN Outcome: Progressing Goal: Individualized Educational Video(s) 09/11/2024 1653 by Emil Roselie SAUNDERS, RN Outcome: Progressing 09/11/2024 1653 by Emil Roselie SAUNDERS, RN Outcome: Progressing   Problem: Activity: Goal: Ability to tolerate increased activity will improve 09/11/2024 1653 by Emil Roselie SAUNDERS, RN Outcome: Progressing 09/11/2024 1653 by Emil Roselie SAUNDERS,  RN Outcome: Progressing   Problem: Cardiac: Goal: Ability to achieve and maintain adequate cardiopulmonary perfusion will improve Outcome: Progressing   Problem: Health Behavior/Discharge Planning: Goal: Ability to safely manage health-related needs after discharge will improve Outcome: Progressing

## 2024-09-11 NOTE — ED Provider Notes (Signed)
 [facility] Provider Note   CSN: [id number] Arrival date & time: 09/11/24  9270     Patient presents with: Shortness of Breath   Henry Knight is a 73 y.o. male.   73 year old male history of hypertension who presents to the emergency department with shortness of breath.  Patient reports that over the past 2 weeks has been having worsening shortness of breath and dyspnea on exertion.  Says that the shortness of breath is also worse at night and has difficulty finding a comfortable. Position with sleeping.  Says that he has noticed some abdominal distention but no leg swelling.  Has had a dry cough.  No fevers or chills.  Has congestion.  No sore throat.  Also is having some pleuritic chest pain.  Does not smoke cigarettes.       Prior to Admission medications   Medication Sig Start Date End Date Taking? Authorizing Provider  doxycycline  (VIBRA -TABS) 100 MG tablet Take 1 tablet (100 mg total) by mouth 2 (two) times daily. 09/05/24   Kuneff, Renee A, DO  LORazepam  (ATIVAN ) 1 MG tablet Take 1 tablet (1 mg total) by mouth 2 (two) times daily as needed for anxiety. 03/28/24   Kuneff, Renee A, DO  omega-3 acid ethyl esters (LOVAZA ) 1 g capsule Take 2 capsules (2 g total) by mouth 2 (two) times daily. 09/05/24   Kuneff, Renee A, DO  traMADol  (ULTRAM ) 50 MG tablet Take 1-2 tablets (50-100 mg total) by mouth every 12 (twelve) hours as needed (mild pain). 03/28/24   Kuneff, Renee A, DO  Turmeric 500 MG CAPS Take 1 tablet by mouth.    [provider]    Allergies: Penicillins    Review of Systems  Updated Vital Signs BP (!) 146/111   Pulse (!) 122   Temp 97.8 F (36.6 C)   Resp (!) 24   Ht 6' (1.829 m)   Wt 106.6 kg   SpO2 95%   BMI 31.87 kg/m   Physical Exam Vitals and nursing note reviewed.  Constitutional:      General: He is not in acute distress.    Appearance: He is well-developed.  HENT:     Head: Normocephalic and  atraumatic.     Right Ear: External ear normal.     Left Ear: External ear normal.     Nose: Nose normal.  Eyes:     Extraocular Movements: Extraocular movements intact.     Conjunctiva/sclera: Conjunctivae normal.     Pupils: Pupils are equal, round, and reactive to light.  Cardiovascular:     Rate and Rhythm: Regular rhythm. Tachycardia present.     Heart sounds: Normal heart sounds.  Pulmonary:     Effort: Pulmonary effort is normal. No respiratory distress.     Breath sounds: Rhonchi (Bibasilarly) present.  Abdominal:     General: There is no distension.     Palpations: Abdomen is soft. There is no mass.     Tenderness: There is no abdominal tenderness. There is no guarding.  Musculoskeletal:     Cervical back: Normal range of motion and neck supple.     Right lower leg: No edema.     Left lower leg: No edema.  Skin:    General: Skin is warm and dry.  Neurological:     Mental Status: He is alert. Mental status is at baseline.  Psychiatric:        Mood and Affect: Mood normal.  Behavior: Behavior normal.     (all labs ordered are listed, but only abnormal results are displayed) Labs Reviewed  COMPREHENSIVE METABOLIC PANEL WITH GFR - Abnormal; Notable for the following components:      Result Value   CO2 20 (*)    Glucose, Bld 121 (*)    ALT 51 (*)    All other components within normal limits  PRO BRAIN NATRIURETIC PEPTIDE - Abnormal; Notable for the following components:   Pro Brain Natriuretic Peptide 1,971.0 (*)    All other components within normal limits  D-DIMER, QUANTITATIVE - Abnormal; Notable for the following components:   D-Dimer, Quant 1.09 (*)    All other components within normal limits  CBG MONITORING, ED - Abnormal; Notable for the following components:   Glucose-Capillary 121 (*)    All other components within normal limits  TROPONIN T, HIGH SENSITIVITY - Abnormal; Notable for the following components:   Troponin T High Sensitivity 43 (*)     All other components within normal limits  TROPONIN T, HIGH SENSITIVITY - Abnormal; Notable for the following components:   Troponin T High Sensitivity 39 (*)    All other components within normal limits  RESP PANEL BY RT-PCR (RSV, FLU A&B, COVID)  RVPGX2  CBC  PROCALCITONIN  HEPARIN  LEVEL (UNFRACTIONATED)    EKG: EKG Interpretation Date/Time:  Tuesday September 11 2024 10:00:14 EST Ventricular Rate:  122 PR Interval:  107 QRS Duration:  151 QT Interval:  355 QTC Calculation: 506 R Axis:   268  Text Interpretation: Atrial flutter/fibrillation RBBB and LAFB Confirmed by Yolande Charleston [phone]) on 09/11/2024 10:32:38 AM  Radiology: CT Angio Chest Pulmonary Embolism (PE) W or WO Contrast Addendum Date: 09/11/2024 ADDENDUM REPORT: 09/11/2024 09:41 ADDENDUM: As noted in the body of the report, the ascending thoracic aorta measures 4.5 cm diameter. Ascending thoracic aortic aneurysm. Recommend semi-annual imaging followup by CTA or MRA and referral to cardiothoracic surgery if not already obtained. This recommendation follows 2010 ACCF/AHA/AATS/ACR/ASA/SCA/SCAI/SIR/STS/SVM Guidelines for the Diagnosis and Management of Patients With Thoracic Aortic Disease. Circulation. 2010; 121: Z733-z630. Aortic aneurysm NOS (ICD10-I71.9) Electronically Signed   By: Camellia Candle M.D.   On: 09/11/2024 09:41   Result Date: 09/11/2024 CLINICAL DATA:  Worsening shortness of breath. EXAM: CT ANGIOGRAPHY CHEST WITH CONTRAST TECHNIQUE: Multidetector CT imaging of the chest was performed using the standard protocol during bolus administration of intravenous contrast. Multiplanar CT image reconstructions and MIPs were obtained to evaluate the vascular anatomy. RADIATION DOSE REDUCTION: This exam was performed according to the departmental dose-optimization program which includes automated exposure control, adjustment of the mA and/or kV according to patient size and/or use of iterative reconstruction technique.  CONTRAST:  OMNIPAQUE  IOHEXOL  350 MG/ML SOLN COMPARISON:  None Available. FINDINGS: Cardiovascular: The heart is enlarged. No substantial pericardial effusion. Coronary artery calcification is evident. Ascending thoracic aorta measures 4.5 cm diameter. Enlargement of the pulmonary outflow tract/main pulmonary arteries suggests pulmonary arterial hypertension. There is no filling defect within the opacified pulmonary arteries to suggest the presence of an acute pulmonary embolus. Mediastinum/Nodes: No mediastinal lymphadenopathy. Calcified nodal tissue in the hilar regions bilaterally suggest old granulomatous disease. The esophagus has normal imaging features. There is no axillary lymphadenopathy. Lungs/Pleura: Small to moderate right and small left pleural effusions. Diffuse interlobular septal thickening is noted in both lungs suggesting edema. Vascular congestion evident with basilar central bronchial wall thickening and collapse/consolidation in the lung bases bilaterally. Upper Abdomen: Multiple cysts are noted in both kidneys, right greater  than left. Most of the cystic lesions have simple features. 2.1 cm exophytic lesion upper pole right kidney on image 163 has attenuation higher than would be expected for a simple cyst likely a cyst complicated by proteinaceous debris or hemorrhage. Tiny nonobstructing stone identified interpolar left kidney. Musculoskeletal: No worrisome lytic or sclerotic osseous abnormality. Review of the MIP images confirms the above findings. IMPRESSION: 1. No CT evidence for acute pulmonary embolus. 2. Cardiomegaly with vascular congestion and diffuse interlobular septal thickening suggesting edema. 3. Small to moderate right and small left pleural effusions with collapse/consolidation in the lung bases bilaterally. 4. Enlargement of the pulmonary outflow tract/main pulmonary arteries suggests pulmonary arterial hypertension. 5. Tiny nonobstructing stone interpolar left kidney.  Electronically Signed: By: Camellia Candle M.D. On: 09/11/2024 09:35   DG Chest 2 View Result Date: 09/11/2024 EXAM: 2 VIEW(S) XRAY OF THE CHEST 09/11/2024 08:15:13 AM COMPARISON: Right rib series dated 02/10/2016. CLINICAL HISTORY: cough, chest pain FINDINGS: LUNGS AND PLEURA: There are hazy and reticular opacities present bilaterally, which may reflect edema or diffuse pneumonitis. There is mild peribronchial cuffing. No pleural effusion. No pneumothorax. HEART AND MEDIASTINUM: No acute abnormality of the cardiac and mediastinal silhouettes. BONES AND SOFT TISSUES: No acute osseous abnormality. IMPRESSION: 1. Hazy and reticular opacities bilaterally, possibly representing edema or diffuse pneumonitis. 2. Mild peribronchial cuffing. Electronically signed by: Evalene Coho MD 09/11/2024 08:36 AM EST RP Workstation: [id number]     Procedures   Medications Ordered in the ED  amiodarone  (NEXTERONE ) 1.8 mg/mL load via infusion 150 mg (has no administration in time range)    Followed by  amiodarone  (NEXTERONE  PREMIX) 360-4.14 MG/200ML-% (1.8 mg/mL) IV infusion (has no administration in time range)    Followed by  amiodarone  (NEXTERONE  PREMIX) 360-4.14 MG/200ML-% (1.8 mg/mL) IV infusion (has no administration in time range)  heparin  bolus via infusion 5,500 Units (has no administration in time range)  heparin  ADULT infusion 100 units/mL (25000 units/250mL) (has no administration in time range)  ondansetron  (ZOFRAN ) injection 4 mg (4 mg Intravenous Given 09/11/24 0854)  iohexol  (OMNIPAQUE ) 350 MG/ML injection 100 mL (100 mLs Intravenous Contrast Given 09/11/24 0856)  furosemide  (LASIX ) injection 40 mg (40 mg Intravenous Given 09/11/24 1009)    Clinical Course as of 09/11/24 1137  Tue Sep 11, 2024  1115 Dw Dr Kate from cardiology. Recommends starting on eliquis  and seeing when he is admitted for TEE cardioversion.  [RP]  1136 Dr Sherre from hospitalist consulted for admission. [RP]    Clinical  Course User Index [RP] Yolande Henry BROCKS, MD                                 Medical Decision Making Amount and/or Complexity of Data Reviewed Labs: ordered. Radiology: ordered.  Risk Prescription drug management. Decision regarding hospitalization.   KAIEN PEZZULLO is a 73 year old male history of hypertension who presents to the emergency department with shortness of breath.  Patient reports that over the past 2 weeks has been having worsening shortness of breath and dyspnea on exertion.   Initial Ddx:  CHF, pneumonia, URI, COPD, tachydysrhythmia, liver failure  MDM/Course:  Patient presents to the emergency department with shortness of breath.  Also has had some abdominal swelling.  Dry cough.  No other infectious symptoms.  On exam does have rhonchi bibasilarly.  Says that his abdomen feels swollen but legs do not appear to have significant swelling.  Was consistently tachycardic  and initially was reading sinus tachycardia but on repeat evaluation his heart rate appears quite uniform and repeat EKG did confirm that he is in atrial flutter.  BNP elevated at 1900.  Troponin marginally elevated but flat making MI highly unlikely.  COVID and flu negative.  Had an elevated D-dimer so underwent a CTA of the chest which shows pleural effusions and pulmonary edema but no obvious signs of pneumonia.  Procalcitonin sent and was pending at this point in time but feel that his symptoms are likely due to heart failure potentially from atrial flutter rather than an infectious cause.  Upon re-evaluation patient remained stable.  Has a CHA2DS2-VASc score of 3 and so we will start him on heparin  in case he undergoes a procedure with cardiology.  Also started on amiodarone  as well.  Discussed with cardiology and hospitalist for admission  This patient presents to the ED for concern of complaints listed in HPI, this involves an extensive number of treatment options, and is a complaint that carries  with it a high risk of complications and morbidity. Disposition including potential need for admission considered.   Dispo: Admit to progressive  Additional history obtained from spouse Records reviewed Outpatient Clinic Notes The following labs were independently interpreted: Chemistry and show no acute abnormality I independently reviewed the following imaging with scope of interpretation limited to determining acute life threatening conditions related to emergency care: Chest x-ray and agree with the radiologist interpretation with the following exceptions: none I personally reviewed and interpreted cardiac monitoring: Atrial flutter RVR I personally reviewed and interpreted the pt's EKG: see above for interpretation  I have reviewed the patients home medications and made adjustments as needed Consults: Cardiology Social Determinants of health:  Geriatric  CRITICAL CARE Performed by: Henry Knight   Total critical care time: 30 minutes  Critical care time was exclusive of separately billable procedures and treating other patients.  Critical care was necessary to treat or prevent imminent or life-threatening deterioration.  Critical care was time spent personally by me on the following activities: development of treatment plan with patient and/or surrogate as well as nursing, discussions with consultants, evaluation of patient's response to treatment, examination of patient, obtaining history from patient or surrogate, ordering and performing treatments and interventions, ordering and review of laboratory studies, ordering and review of radiographic studies, pulse oximetry and re-evaluation of patient's condition.   Portions of this note were generated with Scientist, clinical (histocompatibility and immunogenetics). Dictation errors may occur despite best attempts at proofreading.     Final diagnoses:  New onset of congestive heart failure (HCC)  Atrial flutter with rapid ventricular response [facility])    ED  Discharge Orders     None          Knight Henry JAYSON, MD 09/11/24 1057

## 2024-09-11 NOTE — ED Provider Notes (Signed)
 Clinical Course as of 09/11/24 1227  Tue Sep 11, 2024  1115 Dw Dr Kate from cardiology. Recommends starting on eliquis  and seeing when he is admitted for TEE cardioversion.  [RP]  1136 Dr Sherre from hospitalist consulted for admission. [RP]    Clinical Course User Index [RP] Yolande Lamar BROCKS, MD      Yolande Lamar BROCKS, MD 09/11/24 [phone]

## 2024-09-11 NOTE — Consult Note (Addendum)
 Cardiology Consultation   Patient ID: DUEY LILLER MRN: [id number]; DOB: [birth year]  Admit date: 09/11/2024 Date of Consult: 09/11/2024  PCP:  Catherine Charlies LABOR, DO   [facility] Providers Cardiologist:  None        Patient Profile: Henry Knight is a 73 y.o. male with a hx of hypertension, essential tremor, and skin cancer who is being seen 09/11/2024 for the evaluation of atrial flutter and heart failure exacerbation at the request of Amy Cox DO.  History of Present Illness: Mr. Molstad has no prior cardiac history.   Recently seen 12/3 by PCP and diagnosed with acute bronchitis and progressive varicose veins. He was started on doxycyline and advised to hydrate and use compression stockings.  Presented to [facility] 12/9 for progressive shortness of breath over the past 2 weeks. Reported orthopnea, abdominal distention, and worsening varicose veins. +chest pain and cough.  BP: 179/135 ECG: sinus tachycardia vs AFL HR 121 with new RBBB [ when compared to ECG in 2019. CXR showed evidence of possible pulmonary edema vs pneumonitis. Mild peribronchial cuffing.   CTA chest showed cardiomegaly with pulmonary edema and bilateral pleural effusions. Enlargement of PA.   Pertinent lab work: ALT 51 Pro-BNP 1971 Troponin 43 -> 39 D-Dimer 1.09  Patient was started on IV amiodarone , eliquis  and given one dose of IV lasix  40 mg. 1.5L UOP.  Reports that his shortness of breath did improve.  On interview, patient shared over the last year he has been having fatigue and shortness of breath on exertion.  Also finds when he bends over getting lightheaded and dizzy nearly passing out.  When asked about his shortness of breath recently patient shared he does think it has gotten worse over the last 2 weeks; becoming more short of breath/fatigue with less exertion.  Patient reported at night waking up in the middle of the night drenched in sweat and uncomfortable though would not say  he is necessarily short of breath.   Stated that he is fairly active he goes hiking and biking very often.  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  Shared that he has no chest pain on exertion.  Denied palpitations. Reported abdominal distention recently.  Denied peripheral edema, though did report his varicose veins have worsened. Does report a cough though no fever recently.  Finds that he does get seasonal allergies.  Past Medical History:  Diagnosis Date   Arthritis    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 Rollin)   HTN (hypertension)    Melanoma (HCC) [id number]   MM 2 CHEST TX EXC   Neuromuscular disorder (HCC)    OA (osteoarthritis) of knee 02/20/2019   SCC (squamous cell carcinoma) 05/15/2007   SCCA RIGHT FOREARM TX WITH BX   SCC (squamous cell carcinoma) 07/04/2013   WELL DIFF SCC LEFT FOREARM CX3 5FU   SCC (squamous cell carcinoma) 06/26/2014   SCC IN SITU TOP OF SCALP TX CX3 5FU   SCC (squamous cell carcinoma) 07/27/2017   SCC IN SITU RIGHT SHOULDER TX CX3 5FU   Squamous cell carcinoma of skin 11/10/2004   SCC IN SITU RIGHT FOREARM CX3 5FU    Past Surgical History:  Procedure Laterality Date   COLONOSCOPY W/ POLYPECTOMY  approx 2007; 11/2014   Dr. 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   Procedure: LEFT TOTAL KNEE ARTHROPLASTY;  Surgeon: Melodi Lerner, MD;  Location: [facility];  Service: Orthopedics;  Laterality: Left;  50 mins       Scheduled Meds:  apixaban   5 mg Oral BID   Continuous Infusions:  amiodarone  60 mg/hr (09/11/24 1355)   Followed by    amiodarone      PRN Meds: acetaminophen  **OR** acetaminophen , LORazepam , ondansetron  **OR** ondansetron  (ZOFRAN ) IV, senna-docusate  Allergies:    Allergies  Allergen Reactions   Penicillins Rash and Other (See Comments)    Has patient had a PCN reaction causing immediate rash, facial/tongue/throat swelling, SOB or lightheadedness with hypotension: Unknown Has patient had a PCN reaction causing severe rash involving mucus membranes or skin necrosis: No Has patient had a PCN reaction that required hospitalization: No Has patient had a PCN reaction occurring within the last 10 years: No If all of the above answers are NO, then may proceed with Cephalosporin use.     Social History:   Social History   Socioeconomic History   Marital status: Married    Spouse name: Not on file   Number of children: 1   Years of education: Not on file   Highest education level: Not on file  Occupational History   Not on file  Tobacco Use   Smoking status: Never   Smokeless tobacco: Never  Vaping Use   Vaping status: Never Used  Substance and Sexual Activity   Alcohol use: Yes    Alcohol/week: 8.0 standard drinks of alcohol    Types: 8 Glasses of wine per week    Comment: Weekend   Drug use: No   Sexual activity: Yes    Partners: Female  Other Topics Concern   Not on file  Social History Narrative   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 He lives with his wife & daughter.   Social Drivers of Corporate Investment Banker Strain: Low Risk  (08/01/2024)   Overall Financial Resource Strain (CARDIA)    Difficulty of Paying Living Expenses: Not hard at all  Food Insecurity: No Food Insecurity (08/01/2024)   Hunger Vital Sign    Worried About Running Out of Food in the Last Year: Never true    Ran Out of Food in the Last Year: Never true  Transportation Needs: No Transportation Needs (08/01/2024)   PRAPARE - Administrator, Civil Service (Medical): No    Lack of  Transportation (Non-Medical): No  Physical Activity: Sufficiently Active (08/01/2024)   Exercise Vital Sign    Days of Exercise per Week: 5 days    Minutes of Exercise per Session: 90 min  Stress: No Stress Concern Present (08/01/2024)   [employer] of Occupational Health - Occupational Stress Questionnaire    Feeling of Stress: Not at all  Social Connections: Moderately Integrated (08/01/2024)   Social Connection and Isolation Panel    Frequency of Communication with Friends and Family: Twice a week    Frequency of Social Gatherings with Friends and Family: More than three times a week    Attends Religious Services: More than 4 times per year    Active Member of Clubs or Organizations: No  Attends Banker Meetings: Never    Marital Status: Married  Catering Manager Violence: Not At Risk (08/01/2024)   Humiliation, Afraid, Rape, and Kick questionnaire    Fear of Current or Ex-Partner: No    Emotionally Abused: No    Physically Abused: No    Sexually Abused: No    Family History:   Family History  Problem Relation Age of Onset   Heart disease Father    Alcohol abuse Father    Obesity Brother    Asperger's syndrome Brother    Obesity Mother    Tremor Mother    Anxiety disorder Mother      ROS:  Please see the history of present illness.  All other ROS reviewed and negative.     Physical Exam/Data: Vitals:   09/11/24 1130 09/11/24 1230 09/11/24 1330 09/11/24 1500  BP: (!) 127/109 (!) 149/99 (!) 169/115 (!) 129/96  Pulse: (!) 121 (!) 120 (!) 122 (!) 114  Resp: (!) 25 (!) 23 18 20   Temp:   98.7 F (37.1 C) 98.7 F (37.1 C)  TempSrc:   Oral Oral  SpO2: 93% 94% 95% 95%  Weight:      Height:        Intake/Output Summary (Last 24 hours) at 09/11/2024 1545 Last data filed at 09/11/2024 1251 Gross per 24 hour  Intake --  Output 1550 ml  Net -1550 ml      09/11/2024    7:40 AM 09/05/2024    9:17 AM 05/08/2024    1:31 PM  Last 3 Weights  Weight  (lbs) 235 lb 234 lb 12.8 oz 241 lb  Weight (kg) 106.595 kg 106.505 kg 109.317 kg     Body mass index is 31.87 kg/m.  General:  Well nourished, well developed, in no acute distress HEENT: normal Neck: no JVD Vascular: Distal pulses 2+ bilaterally Cardiac: Normal rhythm, tachycardic rate, no murmur Lungs:  clear to auscultation bilaterally Abd: soft, nontender, distended Ext: no edema, notable varicose veins to the left leg overlying the anterior upper portion.  Skin: warm and dry  Psych:  Normal affect   EKG:  The EKG was personally reviewed and demonstrates:  see hpi Telemetry:  Telemetry was personally reviewed and demonstrates: Patient recently arrived to floor, little information though during interview noted to be in AFL HR ~120  Relevant CV Studies: none  Laboratory Data: High Sensitivity Troponin:   Recent Labs  Lab 09/11/24 0757 09/11/24 1008  TRNPT 43* 39*     Chemistry Recent Labs  Lab 09/11/24 0757  NA 138  K 4.5  CL 106  CO2 20*  GLUCOSE 121*  BUN 23  CREATININE 0.93  CALCIUM  9.2  GFRNONAA >60  ANIONGAP 12    Recent Labs  Lab 09/11/24 0757  PROT 7.2  ALBUMIN 4.5  AST 33  ALT 51*  ALKPHOS 81  BILITOT 0.8   Hematology Recent Labs  Lab 09/11/24 0757  WBC 7.2  RBC 4.81  HGB 15.9  HCT 44.9  MCV 93.3  MCH 33.1  MCHC 35.4  RDW 12.2  PLT 238   BNP Recent Labs  Lab 09/11/24 0757  PROBNP 1,971.0*    DDimer  Recent Labs  Lab 09/11/24 0757  DDIMER 1.09*    Radiology/Studies:  CT Angio Chest Pulmonary Embolism (PE) W or WO Contrast Addendum Date: 09/11/2024 ADDENDUM REPORT: 09/11/2024 09:41 ADDENDUM: As noted in the body of the report, the ascending thoracic aorta measures 4.5 cm diameter. Ascending thoracic aortic aneurysm.  Recommend semi-annual imaging followup by CTA or MRA and referral to cardiothoracic surgery if not already obtained. This recommendation follows 2010 ACCF/AHA/AATS/ACR/ASA/SCA/SCAI/SIR/STS/SVM Guidelines for the  Diagnosis and Management of Patients With Thoracic Aortic Disease. Circulation. 2010; 121: Z733-z630. Aortic aneurysm NOS (ICD10-I71.9) Electronically Signed   By: Camellia Candle M.D.   On: 09/11/2024 09:41   Result Date: 09/11/2024 CLINICAL DATA:  Worsening shortness of breath. EXAM: CT ANGIOGRAPHY CHEST WITH CONTRAST TECHNIQUE: Multidetector CT imaging of the chest was performed using the standard protocol during bolus administration of intravenous contrast. Multiplanar CT image reconstructions and MIPs were obtained to evaluate the vascular anatomy. RADIATION DOSE REDUCTION: This exam was performed according to the departmental dose-optimization program which includes automated exposure control, adjustment of the mA and/or kV according to patient size and/or use of iterative reconstruction technique. CONTRAST:  OMNIPAQUE  IOHEXOL  350 MG/ML SOLN COMPARISON:  None Available. FINDINGS: Cardiovascular: The heart is enlarged. No substantial pericardial effusion. Coronary artery calcification is evident. Ascending thoracic aorta measures 4.5 cm diameter. Enlargement of the pulmonary outflow tract/main pulmonary arteries suggests pulmonary arterial hypertension. There is no filling defect within the opacified pulmonary arteries to suggest the presence of an acute pulmonary embolus. Mediastinum/Nodes: No mediastinal lymphadenopathy. Calcified nodal tissue in the hilar regions bilaterally suggest old granulomatous disease. The esophagus has normal imaging features. There is no axillary lymphadenopathy. Lungs/Pleura: Small to moderate right and small left pleural effusions. Diffuse interlobular septal thickening is noted in both lungs suggesting edema. Vascular congestion evident with basilar central bronchial wall thickening and collapse/consolidation in the lung bases bilaterally. Upper Abdomen: Multiple cysts are noted in both kidneys, right greater than left. Most of the cystic lesions have simple features. 2.1  cm exophytic lesion upper pole right kidney on image 163 has attenuation higher than would be expected for a simple cyst likely a cyst complicated by proteinaceous debris or hemorrhage. Tiny nonobstructing stone identified interpolar left kidney. Musculoskeletal: No worrisome lytic or sclerotic osseous abnormality. Review of the MIP images confirms the above findings. IMPRESSION: 1. No CT evidence for acute pulmonary embolus. 2. Cardiomegaly with vascular congestion and diffuse interlobular septal thickening suggesting edema. 3. Small to moderate right and small left pleural effusions with collapse/consolidation in the lung bases bilaterally. 4. Enlargement of the pulmonary outflow tract/main pulmonary arteries suggests pulmonary arterial hypertension. 5. Tiny nonobstructing stone interpolar left kidney. Electronically Signed: By: Camellia Candle M.D. On: 09/11/2024 09:35   DG Chest 2 View Result Date: 09/11/2024 EXAM: 2 VIEW(S) XRAY OF THE CHEST 09/11/2024 08:15:13 AM COMPARISON: Right rib series dated 02/10/2016. CLINICAL HISTORY: cough, chest pain FINDINGS: LUNGS AND PLEURA: There are hazy and reticular opacities present bilaterally, which may reflect edema or diffuse pneumonitis. There is mild peribronchial cuffing. No pleural effusion. No pneumothorax. HEART AND MEDIASTINUM: No acute abnormality of the cardiac and mediastinal silhouettes. BONES AND SOFT TISSUES: No acute osseous abnormality. IMPRESSION: 1. Hazy and reticular opacities bilaterally, possibly representing edema or diffuse pneumonitis. 2. Mild peribronchial cuffing. Electronically signed by: Evalene Coho MD 09/11/2024 08:36 AM EST RP Workstation: [id number]     Assessment and Plan:  Atrial Flutter, RVR  Started on IV amiodarone  given concern for acute heart failure exacerbation.  Currently in AFL Chad Vasc score ~3  As AFL chronicity is unknown will pursue TEE/DCCV tomorrow. Will make patient NPO at midnight.  TSH and mag  pending Keep K >4 and Mag >2  Continue eliquis  5 mg BID Start lopressor  25 mg BID Stop IV amiodarone    Suspected  acute heart failure Patient reported progressive DOE with abdominal distention.  Weight on 12/3: 234lb. Will ask RN for admission weight.  Imaging shows evidence of volume overload Pro-BNP 1971 Patient received 1 dose of IV diuretic, reports improvement in symptoms though still present. On exam he appears mostly euvolemic.  Will follow up on TEE results tomorrow.  Will hold on further diuresis today.   Elevated Troponin No acute ischemic evidence on ECG Denied chest pain. Given trend, most likely dermand ischemia.   Hypertension BP: 139/102 Medications as above and will titrate pending echocardiogram.   Hyperlipidemia LDL 108   HDL 30  TG 260 Start lipitor 40 mg  Positional orthostatic hypotension ?  Patient reporting positional lightheadedness/dizziness. Could also be 2/2 chronic benzo use.  Once cardioverted will obtain orthostatic vitals. Maybe benefit from BP log outpatient. Will follow up on TEE results.    Per primary Essential tremor Chronic benzo use  Risk Assessment/Risk Scores:       New York  Heart Association (NYHA) Functional Class NYHA Class II/III  CHA2DS2-VASc Score =     This indicates a  % annual risk of stroke. The patient's score is based upon:         For questions or updates, please contact [facility] Please consult [email or web address] for contact info under    Signed, Leontine LOISE Salen, PA-C  09/11/2024 3:45 PM  Patient seen and examined.  Agree with above documentation.  Mr. Eastman is a 74 year old male with a history of hypertension who we are consulted for evaluation of atrial flutter and heart failure at the request of Dr. Sherre.  He was seen last week by his PCP for concern for bronchitis and started on doxycycline .  Presented to [facility] today with worsening shortness of breath.  Initial vital signs  notable for BP 183/125, pulse 124.  EKG showed atrial flutter, rate 122.  Labs showed creatinine 0.9, proBNP 1971, troponin 43 > 39, WBC 7.2, hemoglobin 15.9, platelets 238, D-dimer 1.09.  CTPA showed no evidence of acute PE, cardiomegaly with vascular congestion, small to moderate right and small left pleural effusion, enlarged main pulmonary artery.  He was started on Eliquis  and amiodarone  drip and transferred to [facility].  On exam, patient is alert and oriented, tachycardic, regular, no murmurs, diminished breath sounds, trace edema, no JVD.  For his atrial flutter with RVR, he was started on amiodarone  drip in the ED.  Would avoid amiodarone  at this time as we do not know how long he has been in atrial flutter and need to avoid risk of chemical cardioversion until we exclude left atrial appendage thrombus.  Will discontinue amiodarone  drip and start metoprolol  for rate control.  Atrial flutter can be difficult to rate control and suspect will need cardioversion.  Will plan for TEE/DCCV tomorrow.  He was given IV Lasix  in the ED.  Does not appear significantly volume overloaded, will hold off on further diuresis for now  Lonni LITTIE Nanas, MD

## 2024-09-11 NOTE — Assessment & Plan Note (Signed)
 New onset heart failure, suspect preserved ejection fraction as patient does not have swelling of his lower extremities Continue heparin  gtt. Strict I's and O's Patient may need cardioversion with TEE upon arrival to medical floor Cardiology will need to be consulted

## 2024-09-11 NOTE — ED Notes (Signed)
 C/o nausea and feeling unwell. Provider notified, ordering nausea meds.

## 2024-09-11 NOTE — H&P (Signed)
 History and Physical - Telemedicine   [name] FMW:[id number] DOB: 1950/11/12 DOA: 09/11/2024  PCP: Catherine Charlies LABOR, DO  Patient coming from: home   Referring provider: Dr. Yolande Telemedicine provider: Dr. Sherre Patient location: [facility] at [facility] Referring diagnosis: atrial fibrillation with rvr and suspected new onset heart failure Patient name and DOB verified: Patient was able to verify her first and last name: Henry Knight, date of birth: 1951-05-31.  Patient consented to Telemedicine Evaluation: yes RN virtual assistant: Lorane Asa RN Video encounter time and date: 09/11/24 and at approximately: 12:04  Chief Concern: shortness of breath for 2 weeks  HPI: [name] is a 73 year old male with history of pulmonary arterial hypertension.  09/11/2024: Patient presents to the ED for chief concerns of 2 weeks of shortness of breath, worsening overnight.  Vitals at the time of my evaluation showed temperature of 97.8, respiration 24, heart rate 118, blood pressure 146/111, SpO2 92% on room air.  Serum sodium is 138, potassium 4.5, chloride 106, bicarb 20, BUN of 23, serum creatinine 0.93, eGFR greater than 60, nonfasting blood glucose 121, WBC 7.2, hemoglobin 15.9, platelets of 238.  AST is 33, ALT is 51.  D-dimer is 1.09.  BNP elevated 1971.  High sensitive troponin is 43 and on repeat is 39.  ED treatment: Amiodarone  150 mg IV one-time dose, plus gtt. initiated.  Furosemide  40 mg IV one-time dose, heparin  bolus and gtt.  Ondansetron  4 mg IV one-time dose.  EDP discussed patient with cardiology who states they will consult upon patient arrival to [facility].  Per EDP, cardiology states that patient may need cardioversion and if so that would be 12/10.  09/11/2024: [facility]. Cardiology will need to be consulted upon patient's arrival to medical floor. NPO after midnight for  cardioversion tomorrow, ------------------------- At bedside via telemedicine encounter, patient was able to tell me his first and last name, age, current location, current calendar year.   He reports over the last two weeks, he has been having shortness of breath that is worse with exertion. He states the symptoms worsened last night to unbearable.  Of note, on Saturday, he and spouse went to Christmas party where he had two ham and cheese sandwishes and canned chicken noodle soup.  On Sunday: He had eggs and bacon (he rarely eats this) for breakfast and they went to [employer] and he ate one whole burrito for dinner.   On Monday: he had the second burrito and another can of chicken noodle soup (regular sodium).   He denies swelling of his lower extremities and endorses distension of his abdomen.   Social history: He lives at home with his spouse. He denies tobacco, etoh, and drug use. He is retired and previously worked as a network engineer.   ROS: Constitutional: no weight change, no fever ENT/Mouth: no sore throat, no rhinorrhea Eyes: no eye pain, no vision changes Cardiovascular: no chest pain, + dyspnea,  no edema, no palpitations Respiratory: + cough, no sputum, no wheezing Gastrointestinal: no nausea, no vomiting, no diarrhea, no constipation Genitourinary: no urinary incontinence, no dysuria, no hematuria Musculoskeletal: no arthralgias, no myalgias Skin: no skin lesions, no pruritus, Neuro: + weakness, no loss of consciousness, no syncope Psych: no anxiety, no depression, no decrease appetite Heme/Lymph: no bruising, no bleeding  ED Course: Discussed with EDP, patient requiring hospitalization for new onset atrial fibrillation.  Assessment/Plan  Principal Problem:   Atrial fibrillation  with RVR (HCC) Active Problems:   Heart failure (HCC)   Essential tremor   Social anxiety disorder   Essential hypertension   Hypertriglyceridemia   Chronic  prescription benzodiazepine use   Assessment and Plan:  * Atrial fibrillation with RVR (HCC) This is a new diagnosis Continue telemetry Continue heparin  GTT Patient will need cardiology consultation upon arrival to medicine floor for consultation and consideration for cardioversion on 09/12/24  12/09 at 16:25h: unassigned cardiology was paged for consult: new onset atrial fibrillation with suspected new heart failure. May need cardioversion  Heart failure (HCC) New onset heart failure, suspect preserved ejection fraction as patient does not have swelling of his lower extremities Continue heparin  gtt. Strict I's and O's Patient may need cardioversion with TEE upon arrival to medical floor Cardiology will need to be consulted  Chronic prescription benzodiazepine use PDMP reviewed. Current active prescriptions included lorazepam  1 mg tablet, 180 tablets written on 03/28/24 and filled on 07/12/24.  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   DVT prophylaxis: eliquis  per cardiology  Code Status: full code Diet: heart healthy, npo after midnight Family Communication: spouse was updated over the phone Disposition Plan: pending clinical course Consults called: cardiology Admission status: telemetry, observation  Past Medical History:  Diagnosis Date   Arthritis    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 Rollin)   HTN (hypertension)    Melanoma (HCC) [id number]   MM 2 CHEST TX EXC   Neuromuscular disorder (HCC)    OA (osteoarthritis) of knee 02/20/2019   SCC (squamous cell carcinoma)  05/15/2007   SCCA RIGHT FOREARM TX WITH BX   SCC (squamous cell carcinoma) 07/04/2013   WELL DIFF SCC LEFT FOREARM CX3 5FU   SCC (squamous cell carcinoma) 06/26/2014   SCC IN SITU TOP OF SCALP TX CX3 5FU   SCC (squamous cell carcinoma) 07/27/2017   SCC IN SITU RIGHT SHOULDER TX CX3 5FU   Squamous cell carcinoma of skin 11/10/2004   SCC IN SITU RIGHT FOREARM CX3 5FU   Past Surgical History:  Procedure Laterality Date   COLONOSCOPY W/ POLYPECTOMY  approx 2007; 11/2014   Dr. 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   Procedure: LEFT TOTAL KNEE ARTHROPLASTY;  Surgeon: Melodi Lerner, MD;  Location: [facility];  Service: Orthopedics;  Laterality: Left;  50 mins   Social History:  reports that he has never smoked. He has never used smokeless tobacco. He reports current alcohol use of about 8.0 standard drinks of alcohol per week. He reports that he does not use drugs.  Allergies  Allergen Reactions   Penicillins Rash and Other (See Comments)    Has patient had a PCN reaction causing immediate rash, facial/tongue/throat swelling, SOB or lightheadedness with hypotension: Unknown Has patient had a PCN reaction causing severe rash involving mucus membranes or skin necrosis: No  Has patient had a PCN reaction that required hospitalization: No Has patient had a PCN reaction occurring within the last 10 years: No If all of the above answers are NO, then may proceed with Cephalosporin use.    Family History  Problem Relation Age of Onset   Heart disease Father    Alcohol abuse Father    Obesity Brother    Asperger's syndrome Brother    Obesity Mother    Tremor Mother    Anxiety disorder Mother    Family history: Family history reviewed and not pertinent.  Prior to Admission medications    Medication Sig Start Date End Date Taking? Authorizing Provider  doxycycline  (VIBRA -TABS) 100 MG tablet Take 1 tablet (100 mg total) by mouth 2 (two) times daily. 09/05/24   Kuneff, Renee A, DO  LORazepam  (ATIVAN ) 1 MG tablet Take 1 tablet (1 mg total) by mouth 2 (two) times daily as needed for anxiety. 03/28/24   Kuneff, Renee A, DO  omega-3 acid ethyl esters (LOVAZA ) 1 g capsule Take 2 capsules (2 g total) by mouth 2 (two) times daily. 09/05/24   Kuneff, Renee A, DO  traMADol  (ULTRAM ) 50 MG tablet Take 1-2 tablets (50-100 mg total) by mouth every 12 (twelve) hours as needed (mild pain). 03/28/24   Kuneff, Renee A, DO  Turmeric 500 MG CAPS Take 1 tablet by mouth.    [provider]   Physical Exam completed with assistance of: Lorane Asa, RN, who was at bedside during this portion of the virtual encounter:  Vitals:   09/11/24 1230 09/11/24 1330 09/11/24 1500 09/11/24 1627  BP: (!) 149/99 (!) 169/115 (!) 129/96 (!) 139/102  Pulse: (!) 120 (!) 122 (!) 114 (!) 122  Resp: (!) 23 18 20 18   Temp:  98.7 F (37.1 C) 98.7 F (37.1 C) 97.8 F (36.6 C)  TempSrc:  Oral Oral Oral  SpO2: 94% 95% 95% 95%  Weight:      Height:       Constitutional: appears age-appropriate, mildly anxious, calm Eyes: EOMI,  conjunctivae normal ENMT: Mucous membranes are moist. Hearing appropriate Neck: normal, supple, no masses, no thyromegaly Respiratory: Crackles in bilateral lower lobes, normal respiratory effort. No accessory muscle use.  Cardiovascular: Tachycardia with regular rhythm, no murmurs. No extremity edema.  Abdomen: no tenderness. Bowel sounds positive.  Musculoskeletal: No joint deformity upper and lower extremities. Good ROM, no contractures, no atrophy. Skin: no rashes, ulcers on visible skin Neurologic: Strength is appropriate upper extremities.  Psychiatric: Normal judgment and insight. Alert and oriented x 3. Normal mood.   EKG: independently reviewed, showing atrial flutter with  rate of 122, QTc 506, right ventricular response, right bundle branch block.  Chest x-ray on Admission: I personally reviewed and I agree with radiologist reading as below.  CT Angio Chest Pulmonary Embolism (PE) W or WO Contrast Addendum Date: 09/11/2024 ADDENDUM REPORT: 09/11/2024 09:41 ADDENDUM: As noted in the body of the report, the ascending thoracic aorta measures 4.5 cm diameter. Ascending thoracic aortic aneurysm. Recommend semi-annual imaging followup by CTA or MRA and referral to cardiothoracic surgery if not already obtained. This recommendation follows 2010 ACCF/AHA/AATS/ACR/ASA/SCA/SCAI/SIR/STS/SVM Guidelines for the Diagnosis and Management of Patients With Thoracic Aortic Disease. Circulation. 2010; 121: Z733-z630. Aortic aneurysm NOS (ICD10-I71.9) Electronically Signed   By: Camellia Candle M.D.   On: 09/11/2024 09:41   Result Date: 09/11/2024 CLINICAL DATA:  Worsening shortness of breath. EXAM: CT ANGIOGRAPHY CHEST WITH CONTRAST TECHNIQUE: Multidetector CT imaging of the chest was performed  using the standard protocol during bolus administration of intravenous contrast. Multiplanar CT image reconstructions and MIPs were obtained to evaluate the vascular anatomy. RADIATION DOSE REDUCTION: This exam was performed according to the departmental dose-optimization program which includes automated exposure control, adjustment of the mA and/or kV according to patient size and/or use of iterative reconstruction technique. CONTRAST:  OMNIPAQUE  IOHEXOL  350 MG/ML SOLN COMPARISON:  None Available. FINDINGS: Cardiovascular: The heart is enlarged. No substantial pericardial effusion. Coronary artery calcification is evident. Ascending thoracic aorta measures 4.5 cm diameter. Enlargement of the pulmonary outflow tract/main pulmonary arteries suggests pulmonary arterial hypertension. There is no filling defect within the opacified pulmonary arteries to suggest the presence of an acute pulmonary embolus.  Mediastinum/Nodes: No mediastinal lymphadenopathy. Calcified nodal tissue in the hilar regions bilaterally suggest old granulomatous disease. The esophagus has normal imaging features. There is no axillary lymphadenopathy. Lungs/Pleura: Small to moderate right and small left pleural effusions. Diffuse interlobular septal thickening is noted in both lungs suggesting edema. Vascular congestion evident with basilar central bronchial wall thickening and collapse/consolidation in the lung bases bilaterally. Upper Abdomen: Multiple cysts are noted in both kidneys, right greater than left. Most of the cystic lesions have simple features. 2.1 cm exophytic lesion upper pole right kidney on image 163 has attenuation higher than would be expected for a simple cyst likely a cyst complicated by proteinaceous debris or hemorrhage. Tiny nonobstructing stone identified interpolar left kidney. Musculoskeletal: No worrisome lytic or sclerotic osseous abnormality. Review of the MIP images confirms the above findings. IMPRESSION: 1. No CT evidence for acute pulmonary embolus. 2. Cardiomegaly with vascular congestion and diffuse interlobular septal thickening suggesting edema. 3. Small to moderate right and small left pleural effusions with collapse/consolidation in the lung bases bilaterally. 4. Enlargement of the pulmonary outflow tract/main pulmonary arteries suggests pulmonary arterial hypertension. 5. Tiny nonobstructing stone interpolar left kidney. Electronically Signed: By: Camellia Candle M.D. On: 09/11/2024 09:35   DG Chest 2 View Result Date: 09/11/2024 EXAM: 2 VIEW(S) XRAY OF THE CHEST 09/11/2024 08:15:13 AM COMPARISON: Right rib series dated 02/10/2016. CLINICAL HISTORY: cough, chest pain FINDINGS: LUNGS AND PLEURA: There are hazy and reticular opacities present bilaterally, which may reflect edema or diffuse pneumonitis. There is mild peribronchial cuffing. No pleural effusion. No pneumothorax. HEART AND MEDIASTINUM: No  acute abnormality of the cardiac and mediastinal silhouettes. BONES AND SOFT TISSUES: No acute osseous abnormality. IMPRESSION: 1. Hazy and reticular opacities bilaterally, possibly representing edema or diffuse pneumonitis. 2. Mild peribronchial cuffing. Electronically signed by: Evalene Coho MD 09/11/2024 08:36 AM EST RP Workstation: [id number]   Labs on Admission: I have personally reviewed following labs.  CBC: Recent Labs  Lab 09/11/24 0757  WBC 7.2  HGB 15.9  HCT 44.9  MCV 93.3  PLT 238   Basic Metabolic Panel: Recent Labs  Lab 09/11/24 0757  NA 138  K 4.5  CL 106  CO2 20*  GLUCOSE 121*  BUN 23  CREATININE 0.93  CALCIUM  9.2   GFR: Estimated Creatinine Clearance: 87.9 mL/min (by C-G formula based on SCr of 0.93 mg/dL).  Liver Function Tests: Recent Labs  Lab 09/11/24 0757  AST 33  ALT 51*  ALKPHOS 81  BILITOT 0.8  PROT 7.2  ALBUMIN 4.5   BNP (last 3 results) Recent Labs    09/11/24 0757  PROBNP 1,971.0*   CBG: Recent Labs  Lab 09/11/24 0754  GLUCAP 121*   Urine analysis:    Component Value Date/Time   COLORURINE YELLOW 05/02/2009 1420  APPEARANCEUR CLEAR 05/02/2009 1420   LABSPEC 1.016 05/02/2009 1420   PHURINE 6.5 05/02/2009 1420   GLUCOSEU NEGATIVE 05/02/2009 1420   HGBUR NEGATIVE 05/02/2009 1420   BILIRUBINUR NEGATIVE 05/02/2009 1420   KETONESUR NEGATIVE 05/02/2009 1420   PROTEINUR NEGATIVE 05/02/2009 1420   UROBILINOGEN 0.2 05/02/2009 1420   NITRITE NEGATIVE 05/02/2009 1420   LEUKOCYTESUR  05/02/2009 1420    NEGATIVE MICROSCOPIC NOT DONE ON URINES WITH NEGATIVE PROTEIN, BLOOD, LEUKOCYTES, NITRITE, OR GLUCOSE <1000 mg/dL.   This document was prepared using Dragon Voice Recognition software and may include unintentional dictation errors.  Dr. Sherre [facility] Location: [place]  If 7PM-7AM, please contact overnight-coverage provider If 7AM-7PM, please contact day attending provider [email or web address]  09/11/2024, 4:53 PM

## 2024-09-11 NOTE — ED Triage Notes (Signed)
 Pt notices an increase of SOB over the past 2 weeks and worsening over the night making uncomfortable to sleep in any position esp laying flat. Abdominal distension and and distension in varicose veins, No swelling in lower extremities . Chest pain and wheezing

## 2024-09-11 NOTE — Plan of Care (Signed)
 Henry Knight, is a 73 y.o. male, DOB - 09-06-1951, FMW:[id number]  With history of central tremor since childhood, hypertension for which previously he took medications but was recently taken off of it, varicose veins, history of skin cancer who was recently diagnosed with acute bronchitis few weeks ago presented to [facility] with irregular heartbeat and shortness of breath, in the ER diagnosed with A-fib RVR and mild CHF, was initially placed on amiodarone  drip, received Lasix  in the ER and was admitted virtually by my partner few hours ago.  He is currently off of amiodarone  drip, has been seen by cardiology, case discussed with Dr. Barnetta few minutes ago, plan is to switch him to oral Lopressor , place him on Eliquis  and attempt cardioversion tomorrow.  As needed IV Lopressor  also added, will also check echocardiogram, TSH and free T4.  Note patient had a CTA which was suggestive of pulmonary hypertension but no PE was noted, his D-dimer was elevated so we will go ahead and check lower extremity venous duplex as well.  Patient is currently comfortable and symptom-free eating dinner with wife sitting bedside.  He is on room air and completely symptom-free.   Vitals:   09/11/24 1330 09/11/24 1500 09/11/24 1627 09/11/24 1655  BP: (!) 169/115 (!) 129/96 (!) 139/102   Pulse: (!) 122 (!) 114 (!) 122   Resp: 18 20 18    Temp: 98.7 F (37.1 C) 98.7 F (37.1 C) 97.8 F (36.6 C)   TempSrc: Oral Oral Oral   SpO2: 95% 95% 95%   Weight:    106.3 kg  Height:            Data Review   Micro Results Recent Results (from the past 240 hours)  Resp panel by RT-PCR (RSV, Flu A&B, Covid) Anterior Nasal Swab     Status: None   Collection Time: 09/11/24  7:56 AM   Specimen: Anterior Nasal Swab  Result Value Ref Range Status   SARS Coronavirus 2 by RT PCR NEGATIVE NEGATIVE Final    Comment:  (NOTE) SARS-CoV-2 target nucleic acids are NOT DETECTED.  The SARS-CoV-2 RNA is generally detectable in upper respiratory specimens during the acute phase of infection. The lowest concentration of SARS-CoV-2 viral copies this assay can detect is 138 copies/mL. A negative result does not preclude SARS-Cov-2 infection and should not be used as the sole basis for treatment or other patient management decisions. A negative result may occur with  improper specimen collection/handling, submission of specimen other than nasopharyngeal swab, presence of viral mutation(s) within the areas targeted by this assay, and inadequate number of viral copies(<138 copies/mL). A negative result must be combined with clinical observations, patient history, and epidemiological information. The expected result is Negative.  Fact Sheet for Patients:  bloggercourse.com  Fact Sheet for Healthcare Providers:  seriousbroker.it  This test is no t yet approved or cleared by the [place]  FDA and  has been authorized for detection and/or diagnosis of SARS-CoV-2 by FDA under an Emergency Use Authorization (EUA). This EUA will remain  in effect (meaning this test can be used) for the duration of the COVID-19 declaration under Section 564(b)(1) of the Act, 21 U.S.C.section 360bbb-3(b)(1), unless the authorization is terminated  or revoked sooner.       Influenza A by PCR NEGATIVE NEGATIVE Final   Influenza B by PCR NEGATIVE NEGATIVE Final    Comment: (NOTE) The Xpert Xpress SARS-CoV-2/FLU/RSV plus assay is intended as an aid in the diagnosis of  influenza from Nasopharyngeal swab specimens and should not be used as a sole basis for treatment. Nasal washings and aspirates are unacceptable for Xpert Xpress SARS-CoV-2/FLU/RSV testing.  Fact Sheet for Patients: bloggercourse.com  Fact Sheet for Healthcare  Providers: seriousbroker.it  This test is not yet approved or cleared by the [place]  FDA and has been authorized for detection and/or diagnosis of SARS-CoV-2 by FDA under an Emergency Use Authorization (EUA). This EUA will remain in effect (meaning this test can be used) for the duration of the COVID-19 declaration under Section 564(b)(1) of the Act, 21 U.S.C. section 360bbb-3(b)(1), unless the authorization is terminated or revoked.     Resp Syncytial Virus by PCR NEGATIVE NEGATIVE Final    Comment: (NOTE) Fact Sheet for Patients: bloggercourse.com  Fact Sheet for Healthcare Providers: seriousbroker.it  This test is not yet approved or cleared by the [place]  FDA and has been authorized for detection and/or diagnosis of SARS-CoV-2 by FDA under an Emergency Use Authorization (EUA). This EUA will remain in effect (meaning this test can be used) for the duration of the COVID-19 declaration under Section 564(b)(1) of the Act, 21 U.S.C. section 360bbb-3(b)(1), unless the authorization is terminated or revoked.  Performed at [facility], [address], [place], [place] [address]     Radiology Reports CT Angio Chest Pulmonary Embolism (PE) W or WO Contrast Addendum Date: 09/11/2024 ADDENDUM REPORT: 09/11/2024 09:41 ADDENDUM: As noted in the body of the report, the ascending thoracic aorta measures 4.5 cm diameter. Ascending thoracic aortic aneurysm. Recommend semi-annual imaging followup by CTA or MRA and referral to cardiothoracic surgery if not already obtained. This recommendation follows 2010 ACCF/AHA/AATS/ACR/ASA/SCA/SCAI/SIR/STS/SVM Guidelines for the Diagnosis and Management of Patients With Thoracic Aortic Disease. Circulation. 2010; 121: Z733-z630. Aortic aneurysm NOS (ICD10-I71.9) Electronically Signed   By: Camellia Candle M.D.   On: 09/11/2024 09:41   Result Date:  09/11/2024 CLINICAL DATA:  Worsening shortness of breath. EXAM: CT ANGIOGRAPHY CHEST WITH CONTRAST TECHNIQUE: Multidetector CT imaging of the chest was performed using the standard protocol during bolus administration of intravenous contrast. Multiplanar CT image reconstructions and MIPs were obtained to evaluate the vascular anatomy. RADIATION DOSE REDUCTION: This exam was performed according to the departmental dose-optimization program which includes automated exposure control, adjustment of the mA and/or kV according to patient size and/or use of iterative reconstruction technique. CONTRAST:  OMNIPAQUE  IOHEXOL  350 MG/ML SOLN COMPARISON:  None Available. FINDINGS: Cardiovascular: The heart is enlarged. No substantial pericardial effusion. Coronary artery calcification is evident. Ascending thoracic aorta measures 4.5 cm diameter. Enlargement of the pulmonary outflow tract/main pulmonary arteries suggests pulmonary arterial hypertension. There is no filling defect within the opacified pulmonary arteries to suggest the presence of an acute pulmonary embolus. Mediastinum/Nodes: No mediastinal lymphadenopathy. Calcified nodal tissue in the hilar regions bilaterally suggest old granulomatous disease. The esophagus has normal imaging features. There is no axillary lymphadenopathy. Lungs/Pleura: Small to moderate right and small left pleural effusions. Diffuse interlobular septal thickening is noted in both lungs suggesting edema. Vascular congestion evident with basilar central bronchial wall thickening and collapse/consolidation in the lung bases bilaterally. Upper Abdomen: Multiple cysts are noted in both kidneys, right greater than left. Most of the cystic lesions have simple features. 2.1 cm exophytic lesion upper pole right kidney on image 163 has attenuation higher than would be expected for a simple cyst likely a cyst complicated by proteinaceous debris or hemorrhage. Tiny nonobstructing stone identified  interpolar left kidney. Musculoskeletal: No worrisome lytic or  sclerotic osseous abnormality. Review of the MIP images confirms the above findings. IMPRESSION: 1. No CT evidence for acute pulmonary embolus. 2. Cardiomegaly with vascular congestion and diffuse interlobular septal thickening suggesting edema. 3. Small to moderate right and small left pleural effusions with collapse/consolidation in the lung bases bilaterally. 4. Enlargement of the pulmonary outflow tract/main pulmonary arteries suggests pulmonary arterial hypertension. 5. Tiny nonobstructing stone interpolar left kidney. Electronically Signed: By: Camellia Candle M.D. On: 09/11/2024 09:35   DG Chest 2 View Result Date: 09/11/2024 EXAM: 2 VIEW(S) XRAY OF THE CHEST 09/11/2024 08:15:13 AM COMPARISON: Right rib series dated 02/10/2016. CLINICAL HISTORY: cough, chest pain FINDINGS: LUNGS AND PLEURA: There are hazy and reticular opacities present bilaterally, which may reflect edema or diffuse pneumonitis. There is mild peribronchial cuffing. No pleural effusion. No pneumothorax. HEART AND MEDIASTINUM: No acute abnormality of the cardiac and mediastinal silhouettes. BONES AND SOFT TISSUES: No acute osseous abnormality. IMPRESSION: 1. Hazy and reticular opacities bilaterally, possibly representing edema or diffuse pneumonitis. 2. Mild peribronchial cuffing. Electronically signed by: Evalene Coho MD 09/11/2024 08:36 AM EST RP Workstation: [id number]    CBC Recent Labs  Lab 09/11/24 0757  WBC 7.2  HGB 15.9  HCT 44.9  PLT 238  MCV 93.3  MCH 33.1  MCHC 35.4  RDW 12.2    Chemistries  Recent Labs  Lab 09/11/24 0757  NA 138  K 4.5  CL 106  CO2 20*  GLUCOSE 121*  BUN 23  CREATININE 0.93  CALCIUM  9.2  AST 33  ALT 51*  ALKPHOS 81  BILITOT 0.8   ------------------------------------------------------------------------------------------------------------------ estimated creatinine clearance is 87.8 mL/min (by C-G formula based on  SCr of 0.93 mg/dL). ------------------------------------------------------------------------------------------------------------------ No results for input(s): HGBA1C in the last 72 hours. ------------------------------------------------------------------------------------------------------------------ No results for input(s): CHOL, HDL, LDLCALC, TRIG, CHOLHDL, LDLDIRECT in the last 72 hours. ------------------------------------------------------------------------------------------------------------------ No results for input(s): TSH, T4TOTAL, T3FREE, THYROIDAB in the last 72 hours.  Invalid input(s): FREET3 ------------------------------------------------------------------------------------------------------------------ No results for input(s): VITAMINB12, FOLATE, FERRITIN, TIBC, IRON, RETICCTPCT in the last 72 hours.  Coagulation profile No results for input(s): INR, PROTIME in the last 168 hours.  Recent Labs    09/11/24 0757  DDIMER 1.09*    Cardiac Enzymes No results for input(s): CKMB, TROPONINI, MYOGLOBIN in the last 168 hours.  Invalid input(s): CK ------------------------------------------------------------------------------------------------------------------ Invalid input(s): POCBNP   Signature  Lavada Stank M.D on 09/11/2024 at 6:13 PM   -  To page go to [email or web address]

## 2024-09-11 NOTE — ED Notes (Signed)
 Patient departed [facility] ED with [facility] at this time.

## 2024-09-11 NOTE — ED Notes (Signed)
 Report received from Alexandra, [place]. Assuming pt care at this time.

## 2024-09-11 NOTE — Assessment & Plan Note (Deleted)
 This is a new diagnosis Continue telemetry Continue heparin  GTT Patient will need cardiology consultation upon arrival to medicine floor for consultation and consideration for cardioversion on 09/12/24  12/09 at 16:25h: unassigned cardiology was paged for consult: new onset atrial fibrillation with suspected new heart failure. May need cardioversion

## 2024-09-11 NOTE — ED Notes (Signed)
 Called [facility] for transport to [facility] @14 :29.  Spoke with Luke.

## 2024-09-12 ENCOUNTER — Inpatient Hospital Stay (HOSPITAL_COMMUNITY)

## 2024-09-12 ENCOUNTER — Encounter (HOSPITAL_COMMUNITY): Payer: Self-pay | Admitting: Internal Medicine

## 2024-09-12 ENCOUNTER — Observation Stay (HOSPITAL_COMMUNITY)

## 2024-09-12 ENCOUNTER — Encounter (HOSPITAL_COMMUNITY): Admission: EM | Disposition: A | Payer: Self-pay | Source: Home / Self Care | Attending: Emergency Medicine

## 2024-09-12 DIAGNOSIS — R918 Other nonspecific abnormal finding of lung field: Secondary | ICD-10-CM | POA: Diagnosis not present

## 2024-09-12 DIAGNOSIS — Z6831 Body mass index (BMI) 31.0-31.9, adult: Secondary | ICD-10-CM | POA: Diagnosis not present

## 2024-09-12 DIAGNOSIS — E66811 Obesity, class 1: Secondary | ICD-10-CM

## 2024-09-12 DIAGNOSIS — I110 Hypertensive heart disease with heart failure: Secondary | ICD-10-CM | POA: Diagnosis not present

## 2024-09-12 DIAGNOSIS — Z85828 Personal history of other malignant neoplasm of skin: Secondary | ICD-10-CM | POA: Diagnosis not present

## 2024-09-12 DIAGNOSIS — I351 Nonrheumatic aortic (valve) insufficiency: Secondary | ICD-10-CM

## 2024-09-12 DIAGNOSIS — I2721 Secondary pulmonary arterial hypertension: Secondary | ICD-10-CM | POA: Diagnosis not present

## 2024-09-12 DIAGNOSIS — I5041 Acute combined systolic (congestive) and diastolic (congestive) heart failure: Secondary | ICD-10-CM | POA: Diagnosis not present

## 2024-09-12 DIAGNOSIS — I340 Nonrheumatic mitral (valve) insufficiency: Secondary | ICD-10-CM | POA: Diagnosis not present

## 2024-09-12 DIAGNOSIS — I452 Bifascicular block: Secondary | ICD-10-CM | POA: Diagnosis not present

## 2024-09-12 DIAGNOSIS — I483 Typical atrial flutter: Secondary | ICD-10-CM

## 2024-09-12 DIAGNOSIS — I5021 Acute systolic (congestive) heart failure: Secondary | ICD-10-CM | POA: Diagnosis not present

## 2024-09-12 DIAGNOSIS — E785 Hyperlipidemia, unspecified: Secondary | ICD-10-CM | POA: Diagnosis not present

## 2024-09-12 DIAGNOSIS — Z1152 Encounter for screening for COVID-19: Secondary | ICD-10-CM | POA: Diagnosis not present

## 2024-09-12 DIAGNOSIS — I4891 Unspecified atrial fibrillation: Secondary | ICD-10-CM | POA: Diagnosis not present

## 2024-09-12 DIAGNOSIS — Z7901 Long term (current) use of anticoagulants: Secondary | ICD-10-CM | POA: Diagnosis not present

## 2024-09-12 DIAGNOSIS — I10 Essential (primary) hypertension: Secondary | ICD-10-CM

## 2024-09-12 DIAGNOSIS — I4892 Unspecified atrial flutter: Secondary | ICD-10-CM | POA: Diagnosis present

## 2024-09-12 DIAGNOSIS — G250 Essential tremor: Secondary | ICD-10-CM | POA: Diagnosis not present

## 2024-09-12 DIAGNOSIS — I5082 Biventricular heart failure: Secondary | ICD-10-CM | POA: Diagnosis not present

## 2024-09-12 DIAGNOSIS — F419 Anxiety disorder, unspecified: Secondary | ICD-10-CM | POA: Diagnosis not present

## 2024-09-12 DIAGNOSIS — Q2112 Patent foramen ovale: Secondary | ICD-10-CM

## 2024-09-12 DIAGNOSIS — I272 Pulmonary hypertension, unspecified: Secondary | ICD-10-CM | POA: Diagnosis not present

## 2024-09-12 DIAGNOSIS — Z96652 Presence of left artificial knee joint: Secondary | ICD-10-CM | POA: Diagnosis not present

## 2024-09-12 DIAGNOSIS — M7989 Other specified soft tissue disorders: Secondary | ICD-10-CM | POA: Diagnosis not present

## 2024-09-12 DIAGNOSIS — Z880 Allergy status to penicillin: Secondary | ICD-10-CM | POA: Diagnosis not present

## 2024-09-12 DIAGNOSIS — I493 Ventricular premature depolarization: Secondary | ICD-10-CM | POA: Diagnosis not present

## 2024-09-12 DIAGNOSIS — Z79899 Other long term (current) drug therapy: Secondary | ICD-10-CM | POA: Diagnosis not present

## 2024-09-12 DIAGNOSIS — E781 Pure hyperglyceridemia: Secondary | ICD-10-CM

## 2024-09-12 DIAGNOSIS — I5043 Acute on chronic combined systolic (congestive) and diastolic (congestive) heart failure: Secondary | ICD-10-CM | POA: Diagnosis not present

## 2024-09-12 DIAGNOSIS — N179 Acute kidney failure, unspecified: Secondary | ICD-10-CM | POA: Diagnosis not present

## 2024-09-12 DIAGNOSIS — Z8249 Family history of ischemic heart disease and other diseases of the circulatory system: Secondary | ICD-10-CM | POA: Diagnosis not present

## 2024-09-12 DIAGNOSIS — F4010 Social phobia, unspecified: Secondary | ICD-10-CM | POA: Diagnosis not present

## 2024-09-12 DIAGNOSIS — I5023 Acute on chronic systolic (congestive) heart failure: Secondary | ICD-10-CM

## 2024-09-12 HISTORY — PX: TRANSESOPHAGEAL ECHOCARDIOGRAM (CATH LAB): EP1270

## 2024-09-12 LAB — ECHO TEE
Calc EF: 30.2 %
S' Lateral: 4.3 cm
Single Plane A2C EF: 43.3 %
Single Plane A4C EF: 24.9 %

## 2024-09-12 LAB — CBC WITH DIFFERENTIAL/PLATELET
Abs Immature Granulocytes: 0.02 K/uL (ref 0.00–0.07)
Basophils Absolute: 0 K/uL (ref 0.0–0.1)
Basophils Relative: 0 %
Eosinophils Absolute: 0.1 K/uL (ref 0.0–0.5)
Eosinophils Relative: 1 %
HCT: 45.1 % (ref 39.0–52.0)
Hemoglobin: 15.7 g/dL (ref 13.0–17.0)
Immature Granulocytes: 0 %
Lymphocytes Relative: 32 %
Lymphs Abs: 2.4 K/uL (ref 0.7–4.0)
MCH: 33.5 pg (ref 26.0–34.0)
MCHC: 34.8 g/dL (ref 30.0–36.0)
MCV: 96.2 fL (ref 80.0–100.0)
Monocytes Absolute: 0.6 K/uL (ref 0.1–1.0)
Monocytes Relative: 7 %
Neutro Abs: 4.5 K/uL (ref 1.7–7.7)
Neutrophils Relative %: 60 %
Platelets: 225 K/uL (ref 150–400)
RBC: 4.69 MIL/uL (ref 4.22–5.81)
RDW: 12.5 % (ref 11.5–15.5)
WBC: 7.5 K/uL (ref 4.0–10.5)
nRBC: 0 % (ref 0.0–0.2)

## 2024-09-12 LAB — LIPID PANEL
Cholesterol: 144 mg/dL (ref 0–200)
HDL: 28 mg/dL — ABNORMAL LOW (ref >40)
LDL Cholesterol: 97 mg/dL (ref 0–99)
Total CHOL/HDL Ratio: 5.1 ratio
Triglycerides: 96 mg/dL (ref <150)
VLDL: 19 mg/dL (ref 0–40)

## 2024-09-12 LAB — COMPREHENSIVE METABOLIC PANEL WITH GFR
ALT: 38 U/L (ref 0–44)
AST: 29 U/L (ref 15–41)
Albumin: 3.6 g/dL (ref 3.5–5.0)
Alkaline Phosphatase: 59 U/L (ref 38–126)
Anion gap: 8 (ref 5–15)
BUN: 20 mg/dL (ref 8–23)
CO2: 22 mmol/L (ref 22–32)
Calcium: 8.9 mg/dL (ref 8.9–10.3)
Chloride: 109 mmol/L (ref 98–111)
Creatinine, Ser: 1.18 mg/dL (ref 0.61–1.24)
GFR, Estimated: >60 mL/min (ref >60)
Glucose, Bld: 105 mg/dL — ABNORMAL HIGH (ref 70–99)
Potassium: 4.4 mmol/L (ref 3.5–5.1)
Sodium: 139 mmol/L (ref 135–145)
Total Bilirubin: 0.9 mg/dL (ref 0.0–1.2)
Total Protein: 6.4 g/dL — ABNORMAL LOW (ref 6.5–8.1)

## 2024-09-12 LAB — TSH: TSH: 7.227 u[IU]/mL — ABNORMAL HIGH (ref 0.350–4.500)

## 2024-09-12 LAB — T4, FREE: Free T4: 0.8 ng/dL (ref 0.61–1.12)

## 2024-09-12 LAB — MAGNESIUM: Magnesium: 2.1 mg/dL (ref 1.7–2.4)

## 2024-09-12 SURGERY — TRANSESOPHAGEAL ECHOCARDIOGRAM (TEE) (CATHLAB)
Anesthesia: Monitor Anesthesia Care

## 2024-09-12 MED ORDER — AMIODARONE LOAD VIA INFUSION
150.0000 mg | Freq: Once | INTRAVENOUS | Status: AC
  Administered 2024-09-12: 150 mg via INTRAVENOUS
  Filled 2024-09-12: qty 83.34

## 2024-09-12 MED ORDER — SODIUM CHLORIDE 0.9% FLUSH
3.0000 mL | Freq: Two times a day (BID) | INTRAVENOUS | Status: DC
  Administered 2024-09-12 – 2024-09-13 (×2): 3 mL via INTRAVENOUS

## 2024-09-12 MED ORDER — AMIODARONE HCL IN DEXTROSE 360-4.14 MG/200ML-% IV SOLN
30.0000 mg/h | INTRAVENOUS | Status: DC
  Administered 2024-09-12 – 2024-09-13 (×4): 30 mg/h via INTRAVENOUS
  Filled 2024-09-12 (×3): qty 200

## 2024-09-12 MED ORDER — SPIRONOLACTONE 25 MG PO TABS
25.0000 mg | ORAL_TABLET | Freq: Every day | ORAL | Status: DC
  Administered 2024-09-12 – 2024-09-14 (×3): 25 mg via ORAL
  Filled 2024-09-12 (×3): qty 1

## 2024-09-12 MED ORDER — APIXABAN 5 MG PO TABS
5.0000 mg | ORAL_TABLET | Freq: Two times a day (BID) | ORAL | Status: DC
  Administered 2024-09-12 – 2024-09-14 (×5): 5 mg via ORAL
  Filled 2024-09-12 (×5): qty 1

## 2024-09-12 MED ORDER — LIDOCAINE 2% (20 MG/ML) 5 ML SYRINGE
INTRAMUSCULAR | Status: DC | PRN
  Administered 2024-09-12: 60 mg via INTRAVENOUS

## 2024-09-12 MED ORDER — PROPOFOL 10 MG/ML IV BOLUS
INTRAVENOUS | Status: DC | PRN
  Administered 2024-09-12 (×2): 50 mg via INTRAVENOUS
  Administered 2024-09-12: 30 mg via INTRAVENOUS

## 2024-09-12 MED ORDER — SODIUM CHLORIDE 0.9 % IV SOLN: 250.0000 mL | INTRAVENOUS | Status: DC | PRN

## 2024-09-12 MED ORDER — AMIODARONE HCL IN DEXTROSE 360-4.14 MG/200ML-% IV SOLN
60.0000 mg/h | INTRAVENOUS | Status: AC
  Administered 2024-09-12 (×2): 60 mg/h via INTRAVENOUS
  Filled 2024-09-12 (×2): qty 200

## 2024-09-12 MED ORDER — FUROSEMIDE 10 MG/ML IJ SOLN
40.0000 mg | Freq: Two times a day (BID) | INTRAMUSCULAR | Status: DC
  Administered 2024-09-12: 40 mg via INTRAVENOUS
  Filled 2024-09-12: qty 4

## 2024-09-12 MED ORDER — PHENYLEPHRINE 80 MCG/ML (10ML) SYRINGE FOR IV PUSH (FOR BLOOD PRESSURE SUPPORT): PREFILLED_SYRINGE | INTRAVENOUS | Status: DC | PRN

## 2024-09-12 MED ORDER — SODIUM CHLORIDE 0.9 % IV SOLN: INTRAVENOUS | Status: DC | PRN

## 2024-09-12 MED ORDER — DAPAGLIFLOZIN PROPANEDIOL 10 MG PO TABS
10.0000 mg | ORAL_TABLET | Freq: Every day | ORAL | Status: DC
  Administered 2024-09-12 – 2024-09-14 (×3): 10 mg via ORAL
  Filled 2024-09-12 (×3): qty 1

## 2024-09-12 MED ORDER — FUROSEMIDE 10 MG/ML IJ SOLN
80.0000 mg | Freq: Two times a day (BID) | INTRAMUSCULAR | Status: DC
  Administered 2024-09-12 – 2024-09-14 (×4): 80 mg via INTRAVENOUS
  Filled 2024-09-12 (×4): qty 8

## 2024-09-12 MED ORDER — PROPOFOL 500 MG/50ML IV EMUL
INTRAVENOUS | Status: DC | PRN
  Administered 2024-09-12: 75 ug/kg/min via INTRAVENOUS

## 2024-09-12 MED ORDER — SODIUM CHLORIDE 0.9% FLUSH: 3.0000 mL | INTRAVENOUS | Status: DC | PRN

## 2024-09-12 SURGICAL SUPPLY — 1 items: PAD DEFIB RADIO PHYSIO CONN (PAD) ×1 IMPLANT

## 2024-09-12 NOTE — Assessment & Plan Note (Addendum)
 Patient had successful direct current cardioversion and was placed on IV amiodarone  for rhythm control.   At the time of his discharge his telemetry is showing sinus rhythm with occasional PVC.  Plan to continue with oral amiodarone  and anticoagulation with apixaban . Added metoprolol  succinate 25 mg  Follow up as outpatient

## 2024-09-12 NOTE — Anesthesia Preprocedure Evaluation (Addendum)
 Anesthesia Evaluation  Patient identified by MRN, date of birth, ID band Patient awake    Reviewed: Allergy & Precautions, NPO status , Patient's Chart, lab work & pertinent test results  Airway Mallampati: III  TM Distance: >3 FB Neck ROM: Full    Dental no notable dental hx.    Pulmonary neg pulmonary ROS   Pulmonary exam normal        Cardiovascular hypertension, Normal cardiovascular exam+ dysrhythmias Atrial Fibrillation      Neuro/Psych  PSYCHIATRIC DISORDERS Anxiety      Neuromuscular disease    GI/Hepatic negative GI ROS, Neg liver ROS,,,  Endo/Other  negative endocrine ROS    Renal/GU negative Renal ROS     Musculoskeletal   Abdominal  (+) + obese  Peds  Hematology negative hematology ROS (+)   Anesthesia Other Findings A-fib  Reproductive/Obstetrics                              Anesthesia Physical Anesthesia Plan  ASA: 3  Anesthesia Plan: MAC   Post-op Pain Management:    Induction:   PONV Risk Score and Plan: 1 and Propofol  infusion and Treatment may vary due to age or medical condition  Airway Management Planned: Nasal Cannula  Additional Equipment:   Intra-op Plan:   Post-operative Plan:   Informed Consent: I have reviewed the patients History and Physical, chart, labs and discussed the procedure including the risks, benefits and alternatives for the proposed anesthesia with the patient or authorized representative who has indicated his/her understanding and acceptance.     Dental advisory given  Plan Discussed with: CRNA  Anesthesia Plan Comments:         Anesthesia Quick Evaluation

## 2024-09-12 NOTE — Progress Notes (Signed)
 Bilateral lower extremity venous duplex has been completed. Preliminary results can be found in CV Proc through chart review.   09/12/24 12:48 PM Cathlyn Collet RVT

## 2024-09-12 NOTE — Progress Notes (Signed)
 Heart Failure Navigator Progress Note  Assessed for Heart & Vascular TOC clinic readiness.  Patient does not meet criteria due to Advanced Heart Failure team was consulted. .   Navigator will sign off at this time.   Stephane Haddock, BSN, Scientist, Clinical (histocompatibility And Immunogenetics) Only

## 2024-09-12 NOTE — Plan of Care (Signed)

## 2024-09-12 NOTE — Assessment & Plan Note (Signed)
 Continue statin therapy

## 2024-09-12 NOTE — Assessment & Plan Note (Addendum)
 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

## 2024-09-12 NOTE — Assessment & Plan Note (Signed)
Calculated BMI is 31.4

## 2024-09-12 NOTE — Transfer of Care (Signed)
 Immediate Anesthesia Transfer of Care Note  Patient: Henry Knight  Procedure(s) Performed: TRANSESOPHAGEAL ECHOCARDIOGRAM  Patient Location: Cath Lab  Anesthesia Type:MAC  Level of Consciousness: drowsy  Airway & Oxygen Therapy: Patient Spontanous Breathing and Patient connected to nasal cannula oxygen  Post-op Assessment: Report given to RN and Post -op Vital signs reviewed and stable  Post vital signs: Reviewed and stable  Last Vitals:  Vitals Value Taken Time  BP 101/84  91 09/12/24 1159  Temp    Pulse 117 09/12/24  1159  Resp 15 09/12/24  1159  SpO2 93 09/12/24  1159    Last Pain:  Vitals:   09/12/24 0859  TempSrc: Temporal  PainSc:          Complications: There were no known notable events for this encounter.

## 2024-09-12 NOTE — Interval H&P Note (Signed)
 History and Physical Interval Note:  09/12/2024 8:21 AM  Henry Knight Phlegm  has presented today for surgery, with the diagnosis of afib.  The various methods of treatment have been discussed with the patient and family. After consideration of risks, benefits and other options for treatment, the patient has consented to  Procedure(s): TRANSESOPHAGEAL ECHOCARDIOGRAM (N/A) CARDIOVERSION (N/A) as a surgical intervention.  The patient's history has been reviewed, patient examined, no change in status, stable for surgery.  I have reviewed the patient's chart and labs.  Questions were answered to the patient's satisfaction.     [name]

## 2024-09-12 NOTE — Progress Notes (Addendum)
 Progress Note  Patient Name: Henry Knight Date of Encounter: 09/12/2024 [facility] Cardiologist: Lonni LITTIE Nanas, MD   Interval Summary   Reported not sleeping well last night, reported his breathing was bothering him  Vital Signs Vitals:   09/11/24 2009 09/11/24 2354 09/12/24 0427 09/12/24 0817  BP: 115/84 120/89 117/89 (!) 124/96  Pulse: (!) 114 (!) 115 (!) 116 (!) 118  Resp: 16 18 16 17   Temp: 98.6 F (37 C) 98 F (36.7 C) 98.2 F (36.8 C) 98.2 F (36.8 C)  TempSrc: Oral Oral Oral Oral  SpO2: 96% 96% 96% 98%  Weight:   105.3 kg   Height:        Intake/Output Summary (Last 24 hours) at 09/12/2024 0855 Last data filed at 09/12/2024 0827 Gross per 24 hour  Intake 612.82 ml  Output 1550 ml  Net -937.18 ml      09/12/2024    4:27 AM 09/11/2024    4:55 PM 09/11/2024    7:40 AM  Last 3 Weights  Weight (lbs) 232 lb 3.2 oz 234 lb 4.8 oz 235 lb  Weight (kg) 105.325 kg 106.278 kg 106.595 kg      Telemetry/ECG  Unable to assess - Personally Reviewed  Physical Exam  GEN: No acute distress though tachypneic appearing   Neck: No JVD Cardiac: RRR, no murmurs, rubs, or gallops.  Respiratory: scattered wheezing though no crackles or rhonchi MS: No edema  Assessment & Plan  Henry Knight is a 73 y.o. male with a hx of hypertension, essential tremor, and skin cancer who presented to the [facility] on 12/9 for worsening shortness of breath and abdominal distention. Patient was noted to be hypertensive and in AFL RVR HR 120. Troponin 43 -> 39. Pro-BNP 1971. He was placed on IV amiodarone , anticoagulation and given IV diuresis. Cardiology was consulted and patient was transferred to [facility].   Atrial Flutter, RVR .  Chad Vasc score ~3   IV amiodarone  was stopped yesterday. Interviewed patient in holding area prior to TEE/DCCV today.   Informed Consent   Shared Decision Making/Informed Consent   The risks [stroke, cardiac arrhythmias rarely resulting  in the need for a temporary or permanent pacemaker, skin irritation or burns, esophageal damage, perforation (1:10,000 risk), bleeding, pharyngeal hematoma as well as other potential complications associated with conscious sedation including aspiration, arrhythmia, respiratory failure and death], benefits (treatment guidance, restoration of normal sinus rhythm, diagnostic support) and alternatives of a transesophageal echocardiogram guided cardioversion were discussed in detail with [name] and he is willing to proceed.     Keep K >4 and Mag >2 TSH elevated though normal T4, defer to primary   Continue eliquis  5 mg BID Continue lopressor  25 mg BID     Suspected acute heart failure Patient reported progressive DOE with abdominal distention.  On chart review patient's weight has remained stable over the past several weeks.   Imaging shows evidence of volume overload Pro-BNP 1971 Continue diuresis   Hypertension BP: 124/96 Medications as above and will titrate pending echocardiogram.    Hyperlipidemia LDL 97   HDL 28   Continue lipitor 40 mg, will need lipid panel and LFTs in 6-8 weeks   Positional orthostatic hypotension ?  Patient reporting positional lightheadedness/dizziness. Could also be 2/2 chronic benzo use.  Once cardioverted will obtain orthostatic vitals. Maybe benefit from BP log outpatient.  Will follow up on TEE results.     Per primary Essential tremor Chronic benzo use  For  questions or updates, please contact [name] Please consult [email or web address] for contact info under       Signed, Leontine LOISE Salen, PA-C   Patient seen and examined.  Agree with above documentation.  On exam, patient is alert and oriented, regular rhythm, tachycardic, no murmurs, diminished breath sounds, no LE edema, + JVD.  Severe LV dysfunction on TEE, cardioversion deferred until optimize heart failure.  Advanced heart failure consulted, discussed with Dr. Rolan.  Will  hold metoprolol , start amiodarone  drip as no left atrial appendage thrombus on TEE.  Diurese with IV Lasix .  Lonni LITTIE Nanas, MD

## 2024-09-12 NOTE — Progress Notes (Signed)
 Progress Note   Patient: Henry Knight FMW:[id number] DOB: January 27, 1951 DOA: 09/11/2024     0 DOS: the patient was seen and examined on 09/12/2024   Brief hospital course: Henry Knight was admitted to the hospital with the working diagnosis of heart failure exacerbation, complicated with atrial fibrillation.   73 yo male with the past medical history of pulmonary hypertension who presented with dyspnea. Positive symptoms for the last 2 weeks prior to admission, with worsening dyspnea over the last 24 hrs prior to admission, associated with orthopnea and PND. On hs initial physical examination his blood pressure was 149/99. 169/115, HR 122, RR 23 and 02 saturation 94% Lungs with rales bilaterally with no wheezing or rhonchi, heart with S1 and S2 present and tachycardic, irregularly irregular, abdomen with no distention, no lower extremity edema.   Chest radiograph with cardiomegaly, bilateral hilar vascular congestion, with bilateral central interstitial infiltrates, small bilateral pleural effusions.   123 bpm, left axis deviation, left anterior fascicular block, right bundle branch block, qtc 564, atrial flutter with PVC, with no significant ST segment or T wave changes.   CT chest with no evidence of acute pulmonary embolus.  Cardiomegaly with vascular congestion and diffuse interlobular septal thickening, small to moderate right pleural effusion and small left pleural effusions.  Enlargement of the pulmonary outflow tract.  Small non obstructing stone interpolar left kidney.    Patient placed on amiodarone  and IV furosemide .  Echocardiogram with biventricular failure, postponed direct current cardioversion.  Consulted heart failure team.   Assessment and Plan: * Acute on chronic systolic CHF (congestive heart failure) (HCC) Echocardiogram with reduced LV systolic function 25 to 30%, global hypokinesis and moderate concentric LVH, RV systolic function with mild reduction, RA with  moderate dilatation, no significant valvular disease. Trivial pericardial effusion.  Urine output 1555 ml Systolic blood pressure  130 mmHg range.   Plan to continue diuresis with IV furosemide  Possible addition of spironolactone  and SGLT 2 inh   Atrial flutter (HCC) Continue amiodarone  IV for rate control, anticoagulation with apixaban , Possible direct current cardioversion during this hospitalization, after better compensation, heart failure   Essential hypertension Continue blood pressure monitoring   Hypertriglyceridemia Continue statin therapy   Essential tremor Continue with as needed lorazepam .   Obesity, class 1 Calculated BMI is 31.4        Subjective: Patient is feeling better but not yet back to normal continue to have fatigue and dyspnea on exertion   Physical Exam: Vitals:   09/12/24 1203 09/12/24 1211 09/12/24 1221 09/12/24 1231  BP: (!) 104/90 107/84 (!) 115/94 102/87  Pulse: (!) 117 (!) 118 (!) 117 (!) 118  Resp:  18 18 18   Temp:      TempSrc:      SpO2: 95% 94% 95% 95%  Weight:      Height:       Neurology awake and alert,  ENT with mild pallor Cardiovascular with S1 and S2 present and regular, positive extra beats, no rubs or murmurs Respiratory with positive bilateral rales with no wheezing or rhonchi Abdomen soft and not distended, not tender Trace lower extremity edema, positive varicose veins.   Data Reviewed:    Family Communication: I spoke with patient's wife at the bedside, we talked in detail about patient's condition, plan of care and prognosis and all questions were addressed.   Disposition: Status is: Inpatient Remains inpatient appropriate because: IV diuresis   Planned Discharge Destination: Home     Author: Anisah Kuck Daniel Sagrario Lineberry,  MD 09/12/2024 2:05 PM  For on call review [email or web address].

## 2024-09-12 NOTE — Anesthesia Procedure Notes (Signed)
 Procedure Name: MAC Date/Time: 09/12/2024 11:32 AM  Performed by: Erlene Powell POUR, CRNAPre-anesthesia Checklist: Patient identified, Emergency Drugs available, Suction available, Patient being monitored and Timeout performed Patient Re-evaluated:Patient Re-evaluated prior to induction Oxygen Delivery Method: Nasal cannula

## 2024-09-12 NOTE — Care Management Obs Status (Signed)
 MEDICARE OBSERVATION STATUS NOTIFICATION   Patient Details  Name: Henry Knight MRN: [id number] Date of Birth: 1951-07-05   Medicare Observation Status Notification Given:  Yes    Vonzell Arrie Sharps 09/12/2024, 1:15 PM

## 2024-09-12 NOTE — Consult Note (Addendum)
 Advanced Heart Failure Team Consult Note  Primary Physician: Henry Charlies LABOR, Knight Cardiologist:  Henry LITTIE Nanas, MD  Reason for Consultation: Acute Systolic Heart Failure HPI:    Henry Knight is seen today for evaluation of acute systolic heart failure at the request of Henry Knight.   73 y.o. male with no prior cardiac history. Follows regularly with PCP.  Has been feeling more tired and run down since around September. No prior illness. He is usually very active, swims, bikes, gardens. He is retired and lives with his wife. Has never smoked. Drinks Knight few glasses of wine on the weekend. His father had cardiac disease and maternal grandfather died of heart attack.   Recently diagnosed 09/05/24 with bronchitis by PCP and treated with doxycycline .   He presented to ED 09/11/24 with 2 weeks of progressive shortness of breath. In ED, BP 179/135, T 97.24F, HR 118. ECG showed AFL 123 bpm. CXR showed cardiomegaly and pulmonary edema. CTA negative for PE, but showed vascular congestion, small to moderate bilateral pleural effusion, enlarged main PE, incidental nonobstructive L renal stone. Labs were notable for K 4.5, BUN/Cr 23/0.93, BNP 1971, hs-trop 43>39. He was started on amio gtt and heparin  gtt and given Knight dose of IV Lasix . Cardiology was consulted and he was transferred to [facility].   He underwent TEE only 09/12/24. TEE performed by Henry Knight showed EF ~20%, smoke in the aorta suggestive of low output, No LAA thrombus.  Due to evidence for significant volume overload, DCCV deferred until he is effectively diuresed.   Today breathing is much better. He is still Knight little groggy from procedure. Has had good urine output. No chest pain.   Home Medications Prior to Admission medications   Medication Sig Start Date End Date Taking? Authorizing Provider  Acetaminophen  (TYLENOL  PO) Take 2-4 tablets by mouth as needed.   Yes [provider]  ibuprofen  (ADVIL ) 200 MG tablet Take  2-4 tablets by mouth every 6 (six) hours as needed for moderate pain (pain score 4-6) or mild pain (pain score 1-3).   Yes [provider]  LORazepam  (ATIVAN ) 1 MG tablet Take 1 tablet (1 mg total) by mouth 2 (two) times daily as needed for anxiety. 03/28/24  Yes Kuneff, Henry Knight  omega-3 acid ethyl esters (LOVAZA ) 1 g capsule Take 2 capsules (2 g total) by mouth 2 (two) times daily. 09/05/24  Yes Kuneff, Henry Knight  traMADol  (ULTRAM ) 50 MG tablet Take 1-2 tablets (50-100 mg total) by mouth every 12 (twelve) hours as needed (mild pain). 03/28/24  Yes Henry Charlies LABOR, Knight    Past Medical History: Past Medical History:  Diagnosis Date   Arthritis    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 Rollin)   HTN (hypertension)    Melanoma (HCC) [id number]   MM 2 CHEST TX EXC   Neuromuscular disorder (HCC)    OA (osteoarthritis) of knee 02/20/2019   SCC (squamous cell carcinoma) 05/15/2007   SCCA RIGHT FOREARM TX WITH BX   SCC (squamous cell carcinoma) 07/04/2013   WELL DIFF SCC LEFT FOREARM CX3 5FU   SCC (squamous cell carcinoma) 06/26/2014   SCC IN SITU TOP OF SCALP TX CX3 5FU   SCC (squamous  cell carcinoma) 07/27/2017   SCC IN SITU RIGHT SHOULDER TX CX3 5FU   Squamous cell carcinoma of skin 11/10/2004   SCC IN SITU RIGHT FOREARM CX3 5FU    Past Surgical History: Past Surgical History:  Procedure Laterality Date   COLONOSCOPY W/ POLYPECTOMY  approx 2007; 11/2014   Dr. 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   Procedure: LEFT TOTAL KNEE ARTHROPLASTY;  Surgeon: Melodi Lerner, MD;  Location: [facility];  Service: Orthopedics;  Laterality: Left;  50 mins    Family History: Family History  Problem Relation Age of Onset   Heart disease Father    Alcohol abuse Father    Obesity Brother    Asperger's syndrome Brother    Obesity Mother    Tremor Mother    Anxiety disorder Mother     Social History: Social History   Socioeconomic History   Marital status: Married    Spouse name: Not on file   Number of children: 1   Years of education: Not on file   Highest education level: Not on file  Occupational History   Not on file  Tobacco Use   Smoking status: Never   Smokeless tobacco: Never  Vaping Use   Vaping status: Never Used  Substance and Sexual Activity   Alcohol use: Yes    Alcohol/week: 8.0 standard drinks of alcohol    Types: 8 Glasses of wine per week    Comment: Weekend   Drug use: No   Sexual activity: Yes    Partners: Female  Other Topics Concern   Not on file  Social History Narrative   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 He lives with his wife & daughter.   Social Drivers of Corporate Investment Banker Strain: Low Risk  (08/01/2024)   Overall Financial Resource Strain (CARDIA)    Difficulty of Paying Living Expenses: Not hard at all  Food Insecurity: No Food Insecurity (09/11/2024)   Hunger Vital Sign    Worried About Running Out of Food in the Last Year: Never true    Ran Out of Food in the Last Year: Never true  Transportation Needs: No Transportation Needs (09/11/2024)   PRAPARE - Administrator, Civil Service (Medical): No    Lack of Transportation (Non-Medical): No  Physical Activity: Sufficiently Active (08/01/2024)   Exercise Vital Sign    Days of Exercise per Week: 5 days    Minutes of Exercise per Session: 90 min  Stress: No Stress Concern Present (08/01/2024)   [employer] of Occupational Health - Occupational Stress Questionnaire    Feeling of Stress: Not at all  Social Connections:  Moderately Integrated (09/11/2024)   Social Connection and Isolation Panel    Frequency of Communication with Friends and Family: Twice Knight week    Frequency of Social Gatherings with Friends and Family: More than three times Knight week    Attends Religious Services: More than 4 times per year    Active Member of [employer] or Organizations: No    Attends Banker Meetings: Never    Marital Status: Married    Allergies:  Allergies  Allergen Reactions   Penicillins Rash and Other (See Comments)    Has patient had Knight PCN  reaction causing immediate rash, facial/tongue/throat swelling, SOB or lightheadedness with hypotension: Unknown Has patient had Knight PCN reaction causing severe rash involving mucus membranes or skin necrosis: No Has patient had Knight PCN reaction that required hospitalization: No Has patient had Knight PCN reaction occurring within the last 10 years: No If all of the above answers are NO, then may proceed with Cephalosporin use.     Objective:    Vital Signs:   Temp:  [97.7 F (36.5 C)-98.7 F (37.1 C)] 97.7 F (36.5 C) (12/10 0859) Pulse Rate:  [114-122] 118 (12/10 1231) Resp:  [15-21] 18 (12/10 1231) BP: (102-169)/(79-115) 102/87 (12/10 1231) SpO2:  [91 %-98 %] 95 % (12/10 1231) Weight:  [105.3 kg-106.3 kg] 105.3 kg (12/10 0427)    Weight change: Filed Weights   09/11/24 0740 09/11/24 1655 09/12/24 0427  Weight: 106.6 kg 106.3 kg 105.3 kg   Intake/Output:  Intake/Output Summary (Last 24 hours) at 09/12/2024 1323 Last data filed at 09/12/2024 1154 Gross per 24 hour  Intake 712.82 ml  Output --  Net 712.82 ml    Physical Exam    General: Well appearing. No distress  Cardiac: JVP ~10cm. No murmurs  Resp: Lung sounds clear and equal B/L Extremities: Warm and dry.  1+ BLE edema.  Neuro: Knight&O x3. Affect pleasant.   Telemetry   AFL 110-120s (personally reviewed)  Labs   Basic Metabolic Panel: Recent Labs  Lab 09/11/24 0757 09/11/24 1741  09/12/24 0433  NA 138  --  139  K 4.5  --  4.4  CL 106  --  109  CO2 20*  --  22  GLUCOSE 121*  --  105*  BUN 23  --  20  CREATININE 0.93  --  1.18  CALCIUM  9.2  --  8.9  MG  --  2.0 2.1    Liver Function Tests: Recent Labs  Lab 09/11/24 0757 09/12/24 0433  AST 33 29  ALT 51* 38  ALKPHOS 81 59  BILITOT 0.8 0.9  PROT 7.2 6.4*  ALBUMIN 4.5 3.6   No results for input(s): LIPASE, AMYLASE in the last 168 hours. No results for input(s): AMMONIA in the last 168 hours.  CBC: Recent Labs  Lab 09/11/24 0757 09/12/24 0433  WBC 7.2 7.5  NEUTROABS  --  4.5  HGB 15.9 15.7  HCT 44.9 45.1  MCV 93.3 96.2  PLT 238 225   ProBNP (last 3 results) Recent Labs    09/11/24 0757  PROBNP 1,971.0*    CBG: Recent Labs  Lab 09/11/24 0754  GLUCAP 121*   Medications:    Current Medications:  apixaban   5 mg Oral BID   atorvastatin   40 mg Oral Daily   furosemide   40 mg Intravenous BID    Infusions:  amiodarone  60 mg/hr (09/12/24 1311)   Followed by   amiodarone      Patient Profile   Henry Knight is Knight 73 y.o. male admitted with new systolic heart failure and AFL.   Assessment/Plan   1. Acute Systolic Heart Failure: No prior cardiac history. Father died of cardiac disease. Now in new AFL, duration unknown, although could be symptom of heart failure and not cause. TSH elevated with normal free T4. TEE 12/10 showed EF 25-30% mod LVH, mildly reduced RV function, moderate BAE, mild AI, IVD dilated. Does not appear low output on exam or by labs, compensating well. Consider ischemic eval.  - volume up on exam; increase IV Lasix  80 mg bid - start  spiro 25 mg daily - start farxiga  10 mg daily - hold on ? blocker with acute CHF - suspect can add losartan tomorrow - place TED hose - may need R/LHC once diuresed further 2. Atrial Flutter: New diagnosis. 2:1 with VR 100-120s.  Had TEE today but no DCCV (deferred until he has had adequate diuresis).  - on amiodarone   gtt - on eliquis  5 mg bid, may need to switch to heparin  pending timing of cath 3. ?Pulmonary Hypertension: CTA with dilated main pulmonary arteries, suggestive of PAH, no ILD. TEE with mild RV dysfunction. No history of pulmonary disease. Never smoked. Has never had Knight sleep study. May need cath.  4. PFO: Small, noted on TEE with mainly L>R shunting. Bubble was negative. 5. HLD: LDL 97. Continue atorvastatin  40 mg daily 6. Vericose Veins: compression hose  Length of Stay: 0  Jordan Lee, NP  09/12/2024, 1:23 PM  Advanced Heart Failure Team Pager [phone] (M-F; 7a - 5p)  Please contact [facility] for night-coverage after hours (4p -7a ) and weekends on [email or web address]  Patient seen with NP, I formulated the plan and agree with the above note.   73 yo with minimal past history, has been on statin for hyperlipidemia.  Has had some exertional dyspnea since this summer.  He has had significantly worsened dyspnea for 1-2 weeks (since after Thanksgiving).  No exertional chest pain.  Patient came to the ER because of dyspnea, pro-BNP elevated. CTA chest showed no PE.  He was in atrial flutter with RVR. He was started on apixaban  and set up for TEE-guided DCCV today.  TEE showed EF 25-30% with diffuse hypokinesis, mild RV dysfunction, no LA appendage thrombus, mild MR, and dilated IVC.  Given concern for significant volume overloaded, it was elected not to cardiovert him yet and to consult HF service for volume optimization prior to DCCV.  Amiodarone  gtt was started.   Patient reports mild ongoing exertional dyspnea.  He is remains in atrial flutter, rate 110s.   General: NAD Neck: JVP 12-14 cm, no thyromegaly or thyroid  nodule.  Lungs: Clear to auscultation bilaterally with normal respiratory effort. CV: Nondisplaced PMI.  Heart tachy, regular S1/S2 with widely split S2, no S3/S4, no murmur.  No peripheral edema.  No carotid bruit.  Normal pedal pulses.  Abdomen: Soft, nontender, no  hepatosplenomegaly, no distention.  Skin: Intact without lesions or rashes.  Neurologic: Alert and oriented x 3.  Psych: Normal affect. Extremities: No clubbing or cyanosis.  HEENT: Normal.   1. Acute systolic CHF: TEE today showed EF 25-30% with diffuse hypokinesis, mild RV dysfunction, no LA appendage thrombus, mild MR, and dilated IVC. Exertional dyspnea for several months but worse x 1-2 weeks.  He was in atrial flutter/RVR at admission, does not feel palpitations.  No exertional chest pain, troponin minimally elevated with no trend (suspect demand ischemia from volume overload, ACS unlikely). 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  Alternatively, he could have Knight cardiomyopathy from some other etiology (ischemic cardiomyopathy) with AFL triggered by the CHF.  He remains volume overloaded on exam with dilated IVC on echo.  - Reasonable to diurese further before DCCV.  Will increase Lasix  to 80 mg IV bid.  - Start Farxiga  10 mg daily. - Start spironolactone  25 daily.  - Entresto if BP/creatinine remain stable.  - On Eliquis  now and has had TEE, would probably aim for DCCV tomorrow or the next day after further diuresis since TEE has already cleared LA appendage with repeat  echo in 1-2 months and cath if EF remains low.  Alternatively, could proceed with LHC/RHC this admission but would have to hold the Eliquis  and then repeat TEE prior to DCCV.  2. Atrial flutter: Admitted with AFL/RVR, ?cause of cardiomyopathy or effect of cardiomyopathy.  See discussion above. TEE today cleared the LA appendage.  - He is on amiodarone  gtt for rate control.  - He is on apixaban .  - DCCV tomorrow or Friday depending on volume status.  3. PFO: Small PFO noted on TEE.   Ezra Shuck 09/12/2024 3:27 PM

## 2024-09-12 NOTE — CV Procedure (Signed)
 TRANSESOPHAGEAL ECHOCARDIOGRAM (TEE) NOTE  INDICATIONS: Atrial flutter  PROCEDURE:   Informed consent was obtained prior to the procedure. The risks, benefits and alternatives for the procedure were discussed and the patient comprehended these risks.  Risks include, but are not limited to, cough, sore throat, vomiting, nausea, somnolence, esophageal and stomach trauma or perforation, bleeding, low blood pressure, aspiration, pneumonia, infection, trauma to the teeth and death.    After a procedural time-out, the patient was given propofol  for sedation by anesthesia. See their separate report.  The patient's heart rate, blood pressure, and oxygen saturation are monitored continuously during the procedure.The oropharynx was anesthetized with topical cetacaine.  The transesophageal probe was inserted in the esophagus and stomach without difficulty and multiple views were obtained.  The patient was kept under observation until the patient left the procedure room.  I was present face-to-face 100% of this time. The patient left the procedure room in stable condition.   Agitated microbubble saline contrast was administered.  COMPLICATIONS:    There were no immediate complications.  Findings:  LEFT VENTRICLE: The left ventricular wall thickness is moderately increased.  The left ventricular cavity is normal in size. Wall motion is severely globally hypokinetic.  LVEF is 25-30%.  RIGHT VENTRICLE:  The right ventricle demonstrates mild systolic dysfunction of the RV free wall. No masses noted.    LEFT ATRIUM:  The left atrium is moderately dilated in size without any thrombus or masses.  There is spontaneous echo contrast (smoke) in the left atrium consistent with a low flow state.  LEFT ATRIAL APPENDAGE:  The left atrial appendage is free of any thrombus or masses. The appendage has single lobes. Pulse doppler indicates low flow in the appendage.  ATRIAL SEPTUM:  The atrial septum  demonstrates a possible small left to right PFO, but no evidence for right to left shunting by saline microbubble contrast.  RIGHT ATRIUM:  The right atrium is moderately dilated in size and function without any thrombus or masses.  MITRAL VALVE:  The mitral valve is normal in structure and function with Mild functional regurgitation.  There were no vegetations or stenosis.  AORTIC VALVE:  The aortic valve is trileaflet, normal in structure and function with Mild regurgitation.  There were no vegetations or stenosis  TRICUSPID VALVE:  The tricuspid valve is normal in structure and function with trivial regurgitation.  There were no vegetations or stenosis   PULMONIC VALVE:  The pulmonic valve is normal in structure and function with trivial regurgitation.  There were no vegetations or stenosis.   AORTIC ARCH, ASCENDING AND DESCENDING AORTA:  There was no Shaune et. Al, 1992) atherosclerosis of the ascending aorta, aortic arch, or proximal descending aorta. Smoke is noted in the aortic arch, suggestive of low cardiac output.  12. PULMONARY VEINS: Anomalous pulmonary venous return was not noted.  13. PERICARDIUM: The pericardium appeared normal and non-thickened.  There is a trivial circumferential pericardial effusion.  IMPRESSION:   LVEF 25-30%, global hypokinesis Mild RV systolic dysfunction of the free wall No LAA thrombus Possible small left to right, but not right to left PFO Moderate biatrial enlargement Moderate LVH Mild MR, AI Trivial TR, PI Dilated IVC  RECOMMENDATIONS:    D/w primary cardiology and advanced heart failure - given cardiomyopathy with dilated IVC and signs of low output, DCCV was not performed.  Time Spent Directly with the Patient:  60 minutes   Vinie KYM Maxcy, MD, [place], FNLA, FACP  [place]  [facility]  Medical Director of the Advanced Lipid Disorders &  Cardiovascular Risk Reduction Clinic Diplomate of the American Board of Clinical  Lipidology Attending Cardiologist  Direct Dial: [phone]  Fax: [phone]  Website:  [email or web address] Vinie BROCKS Taishawn Smaldone 09/12/2024, 12:05 PM

## 2024-09-13 ENCOUNTER — Inpatient Hospital Stay (HOSPITAL_COMMUNITY): Admitting: Anesthesiology

## 2024-09-13 ENCOUNTER — Encounter (HOSPITAL_COMMUNITY)
Admission: EM | Disposition: A | Discharge status: Home / Self Care | Payer: Self-pay | Source: Home / Self Care | Attending: Internal Medicine

## 2024-09-13 ENCOUNTER — Telehealth (HOSPITAL_COMMUNITY): Payer: Self-pay

## 2024-09-13 ENCOUNTER — Other Ambulatory Visit (HOSPITAL_COMMUNITY): Payer: Self-pay

## 2024-09-13 DIAGNOSIS — I483 Typical atrial flutter: Secondary | ICD-10-CM | POA: Diagnosis not present

## 2024-09-13 DIAGNOSIS — I10 Essential (primary) hypertension: Secondary | ICD-10-CM | POA: Diagnosis not present

## 2024-09-13 DIAGNOSIS — I5023 Acute on chronic systolic (congestive) heart failure: Secondary | ICD-10-CM | POA: Diagnosis not present

## 2024-09-13 DIAGNOSIS — E781 Pure hyperglyceridemia: Secondary | ICD-10-CM | POA: Diagnosis not present

## 2024-09-13 DIAGNOSIS — I272 Pulmonary hypertension, unspecified: Secondary | ICD-10-CM

## 2024-09-13 HISTORY — PX: CARDIOVERSION: EP1203

## 2024-09-13 LAB — MAGNESIUM: Magnesium: 2.1 mg/dL (ref 1.7–2.4)

## 2024-09-13 LAB — BASIC METABOLIC PANEL WITH GFR
Anion gap: 10 (ref 5–15)
BUN: 23 mg/dL (ref 8–23)
CO2: 23 mmol/L (ref 22–32)
Calcium: 8.6 mg/dL — ABNORMAL LOW (ref 8.9–10.3)
Chloride: 104 mmol/L (ref 98–111)
Creatinine, Ser: 1.13 mg/dL (ref 0.61–1.24)
GFR, Estimated: >60 mL/min (ref >60)
Glucose, Bld: 107 mg/dL — ABNORMAL HIGH (ref 70–99)
Potassium: 3.6 mmol/L (ref 3.5–5.1)
Sodium: 137 mmol/L (ref 135–145)

## 2024-09-13 SURGERY — CARDIOVERSION (CATH LAB)
Anesthesia: General

## 2024-09-13 MED ORDER — SACUBITRIL-VALSARTAN 24-26 MG PO TABS
1.0000 | ORAL_TABLET | Freq: Two times a day (BID) | ORAL | Status: DC
  Administered 2024-09-13 – 2024-09-14 (×3): 1 via ORAL
  Filled 2024-09-13 (×3): qty 1

## 2024-09-13 MED ORDER — PHENYLEPHRINE HCL (PRESSORS) 10 MG/ML IV SOLN
INTRAVENOUS | Status: DC | PRN
  Administered 2024-09-13: 160 ug via INTRAVENOUS

## 2024-09-13 MED ORDER — POTASSIUM CHLORIDE CRYS ER 20 MEQ PO TBCR
40.0000 meq | EXTENDED_RELEASE_TABLET | Freq: Once | ORAL | Status: AC
  Administered 2024-09-13: 40 meq via ORAL
  Filled 2024-09-13: qty 2

## 2024-09-13 MED ORDER — ENSURE PLUS HIGH PROTEIN PO LIQD
237.0000 mL | Freq: Two times a day (BID) | ORAL | Status: DC
  Administered 2024-09-14: 237 mL via ORAL

## 2024-09-13 MED ORDER — PROPOFOL 10 MG/ML IV BOLUS
INTRAVENOUS | Status: DC | PRN
  Administered 2024-09-13: 20 mg via INTRAVENOUS
  Administered 2024-09-13: 30 mg via INTRAVENOUS
  Administered 2024-09-13: 50 mg via INTRAVENOUS

## 2024-09-13 MED ORDER — LIDOCAINE 2% (20 MG/ML) 5 ML SYRINGE
INTRAMUSCULAR | Status: DC | PRN
  Administered 2024-09-13: 60 mg via INTRAVENOUS

## 2024-09-13 SURGICAL SUPPLY — 1 items: PAD DEFIB RADIO PHYSIO CONN (PAD) ×1 IMPLANT

## 2024-09-13 NOTE — Progress Notes (Signed)
 Advanced Heart Failure Rounding Note  Cardiologist: Lonni LITTIE Nanas, MD  Chief Complaint: Acute BiV Heart Failure Subjective:    3.8L UOP. sCr stable. Remains in 2: AFL. Sitting on the edge of the bed. No SOB, CP or dizziness  Objective:    Weight Range: 103.5 kg Body mass index is 30.94 kg/m.   Vital Signs:   Temp:  [97.7 F (36.5 C)-98.2 F (36.8 C)] 97.8 F (36.6 C) (12/11 0416) Pulse Rate:  [111-118] 111 (12/11 0416) Resp:  [15-21] 18 (12/11 0416) BP: (102-136)/(77-98) 129/98 (12/11 0416) SpO2:  [91 %-98 %] 96 % (12/11 0416) Weight:  [103.5 kg] 103.5 kg (12/11 0416) Last BM Date : 09/13/24  Weight change: Filed Weights   09/11/24 1655 09/12/24 0427 09/13/24 0416  Weight: 106.3 kg 105.3 kg 103.5 kg   Intake/Output:  Intake/Output Summary (Last 24 hours) at 09/13/2024 0701 Last data filed at 09/13/2024 0400 Gross per 24 hour  Intake 627.15 ml  Output 3800 ml  Net -3172.85 ml    Physical Exam    General: Well appearing. No distress  Cardiac: JVP ~8 cm. No murmurs  Resp: Lung sounds clear and equal B/L Extremities: Warm and dry.  No peripheral edema. + TED hose Neuro: A&O x3. Affect pleasant.   Telemetry   2:1 AFL (personally reviewed)  Labs    CBC Recent Labs    09/11/24 0757 09/12/24 0433  WBC 7.2 7.5  NEUTROABS  --  4.5  HGB 15.9 15.7  HCT 44.9 45.1  MCV 93.3 96.2  PLT 238 225   Basic Metabolic Panel Recent Labs    87/89/74 0433 09/13/24 0451  NA 139 137  K 4.4 3.6  CL 109 104  CO2 22 23  GLUCOSE 105* 107*  BUN 20 23  CREATININE 1.18 1.13  CALCIUM  8.9 8.6*  MG 2.1 2.1   Liver Function Tests Recent Labs    09/11/24 0757 09/12/24 0433  AST 33 29  ALT 51* 38  ALKPHOS 81 59  BILITOT 0.8 0.9  PROT 7.2 6.4*  ALBUMIN 4.5 3.6   ProBNP (last 3 results) Recent Labs    09/11/24 0757  PROBNP 1,971.0*   D-Dimer Recent Labs    09/11/24 0757  DDIMER 1.09*   Fasting Lipid Panel Recent Labs    09/12/24 0433   CHOL 144  HDL 28*  LDLCALC 97  TRIG 96  CHOLHDL 5.1   Thyroid  Function Tests Recent Labs    09/12/24 0433  TSH 7.227*   Medications:    Scheduled Medications:  apixaban   5 mg Oral BID   atorvastatin   40 mg Oral Daily   dapagliflozin  propanediol  10 mg Oral Daily   furosemide   80 mg Intravenous BID   potassium chloride  40 mEq Oral Once   sodium chloride  flush  3 mL Intravenous Q12H   spironolactone   25 mg Oral Daily    Infusions:  sodium chloride      amiodarone  30 mg/hr (09/12/24 2227)    PRN Medications: sodium chloride , acetaminophen  **OR** acetaminophen , LORazepam , ondansetron  **OR** ondansetron  (ZOFRAN ) IV, senna-docusate, sodium chloride  flush  Assessment/Plan   1. Acute Systolic Heart Failure: TEE 12/10 showed EF 25-30% mod LVH, mildly RV dysfunction, mild AI, IVC dilated. No prior cardiac history. Father died of cardiac disease. 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 Although can not rule out ischemic causes, no RWMA on echo. Consider ischemic eval in the future.  - continue IV Lasix  80 mg prior to DCCV - continue  spiro 25 mg daily - continue farxiga  10 mg daily - start entresto 24/26 mg bid - hold on ? blocker with acute CHF - TED hose - DCCV this afternoon - may need R/LHC down the road if EF does not recover  2. Atrial Flutter: New diagnosis. 2:1 with VR 100-120s.  Had TEE 12/10 but DCCV deferred until he has had adequate diuresis.  - on amiodarone  gtt - on eliquis  5 mg bid - repeat DCCV this afternoon  3. ?Pulmonary Hypertension: CTA with dilated main pulmonary arteries, suggestive of PAH, no ILD. TEE with mild RV dysfunction. No history of pulmonary disease. Never smoked. Has never had a sleep study. May need cath in the future.  4. PFO: Small, noted on TEE with mainly L>R shunting.   5. HLD: LDL 97. Continue atorvastatin  40 mg daily  6. Vericose Veins: compression hose  Length of Stay: 1  Henry Wohl, NP  09/13/2024, 7:01  AM  Advanced Heart Failure Team Pager [phone] (M-F; 7a - 5p)  Please contact [facility] for night-coverage after hours (5p -7a ) and weekends on [email or web address]

## 2024-09-13 NOTE — Progress Notes (Signed)
 Progress Note   Patient: Henry Knight FMW:[id number] DOB: 08-Jun-1951 DOA: 09/11/2024     1 DOS: the patient was seen and examined on 09/13/2024   Brief hospital course: [name] was admitted to the hospital with the working diagnosis of heart failure exacerbation, complicated with atrial fibrillation.   73 yo male with the past medical history of pulmonary hypertension who presented with dyspnea. Positive symptoms for the last 2 weeks prior to admission, with worsening dyspnea over the last 24 hrs prior to admission, associated with orthopnea and PND. On hs initial physical examination his blood pressure was 149/99. 169/115, HR 122, RR 23 and 02 saturation 94% Lungs with rales bilaterally with no wheezing or rhonchi, heart with S1 and S2 present and tachycardic, irregularly irregular, abdomen with no distention, no lower extremity edema.   Chest radiograph with cardiomegaly, bilateral hilar vascular congestion, with bilateral central interstitial infiltrates, small bilateral pleural effusions.   EKG 123 bpm, left axis deviation, left anterior fascicular block, right bundle branch block, qtc 564, atrial flutter with PVC, with no significant ST segment or T wave changes.   CT chest with no evidence of acute pulmonary embolus.  Cardiomegaly with vascular congestion and diffuse interlobular septal thickening, small to moderate right pleural effusion and small left pleural effusions.  Enlargement of the pulmonary outflow tract.  Small non obstructing stone interpolar left kidney.    Patient placed on amiodarone  and IV furosemide .  Echocardiogram with biventricular failure, postponed direct current cardioversion.  Consulted heart failure team.  12/11 improved volume status, plan for DC cardioversion today.   Assessment and Plan: * Acute on chronic systolic CHF (congestive heart failure) (HCC) Echocardiogram with reduced LV systolic function 25 to 30%, global hypokinesis and  moderate concentric LVH, RV systolic function with mild reduction, RA with moderate dilatation, no significant valvular disease. Trivial pericardial effusion.  Urine output 3,800 ml Systolic blood pressure 120 mmHg range.   Plan to continue diuresis with IV furosemide  Medical therapy with spironolactone , entresto and SGLT 2 inh.   Atrial flutter (HCC) Continue amiodarone  IV for rate control, anticoagulation with apixaban , Plan for direct current cardioversion today.   Add 40 meq Kcl x2 to prevent hypokalemia, Mf is 2.1 today and K is 3,6   Essential hypertension Continue blood pressure monitoring  Added entresto and spironolactone  for RAAS inhibition.   Hypertriglyceridemia Continue statin therapy   Essential tremor Continue with as needed lorazepam .   Obesity, class 1 Calculated BMI is 31.4         Subjective: Patient is feeling much better, he has lost significant fluid over last 24 hrs. No chest pain and no palpitations, no PND or orthopnea   Physical Exam: Vitals:   09/12/24 2038 09/13/24 0015 09/13/24 0416 09/13/24 0825  BP: 111/85 116/77 (!) 129/98 (!) 119/96  Pulse: (!) 117 (!) 115 (!) 111 62  Resp: 18 18 18 18   Temp: 97.7 F (36.5 C) 98 F (36.7 C) 97.8 F (36.6 C)   TempSrc: Oral Oral Oral Oral  SpO2: 96% 96% 96%   Weight:   103.5 kg   Height:       Neurology awake and alert ENT with mild pallor with no icterus Cardiovascular with S1 and S2 present and regular with no gallops, rubs or murmurs No JVD Respiratory with no rales or wheezing, no rhonchi, Abdomen with no distention, soft and non tender No lower extremity edema ted hose in place  Data Reviewed:    Family Communication: no family  at the bedside   Disposition: Status is: Inpatient Remains inpatient appropriate because: recovering heart failure   Planned Discharge Destination: Home     Author: Elidia Toribio Furnace, MD 09/13/2024 10:10 AM  For on call review [email or web address].

## 2024-09-13 NOTE — Plan of Care (Signed)

## 2024-09-13 NOTE — TOC Initial Note (Addendum)
 Transition of Care [facility]) - Initial/Assessment Note    Patient Details  Name: Henry Knight MRN: [id number] Date of Birth: 31-Dec-1950  [facility] CM/SW Contact:    Arlana JINNY Nicholaus [place] Phone Number: [phone] 09/13/2024, 1:50 PM  Clinical Narrative:     11:21 AM- HF CSW attempted to meet with patient at bedside. Patient was not in the room at the time. CSW will follow up with patient at a more appropriate time.   HF CSW met with patient and wife at bedside. Patient stated that he and his wife live together alone. Patient stated that he has no history of HH services. Patient stated that he does not use any equipment. Patient stated that he has a scale at home. Patient stated that he has a PCP. CSW explained that a hospital follow up is typically scheduled closer towards dc. Patient is agreeable, and requesting morning appointment. Wife will provide transportation at dc.   HF CSW/CM will continue to follow and monitor for dc readiness.                     Patient Goals and CMS Choice            Expected Discharge Plan and Services                                              Prior Living Arrangements/Services                       Activities of Daily Living   ADL Screening (condition at time of admission) Independently performs ADLs?: Yes (appropriate for developmental age) Is the patient deaf or have difficulty hearing?: No Does the patient have difficulty seeing, even when wearing glasses/contacts?: No Does the patient have difficulty concentrating, remembering, or making decisions?: No  Permission Sought/Granted                  Emotional Assessment              Admission diagnosis:  Atrial flutter with rapid ventricular response (HCC) [I48.92] Atrial fibrillation with RVR (HCC) [I48.91] New onset of congestive heart failure (HCC) [I50.9] Atrial fibrillation with rapid ventricular response (HCC)  [I48.91] Patient Active Problem List   Diagnosis Date Noted   Atrial fibrillation with rapid ventricular response (HCC) 09/12/2024   Acute combined systolic and diastolic heart failure (HCC) 09/12/2024   Atrial flutter (HCC) 09/12/2024   Atrial fibrillation with RVR (HCC) 09/11/2024   Acute on chronic systolic CHF (congestive heart failure) (HCC) 09/11/2024   Atrial flutter with rapid ventricular response (HCC) 09/11/2024   Varicose veins of both lower extremities with pain 09/05/2024   Muscle spasms of both lower extremities 03/04/2022   BCC (basal cell carcinoma of skin) 07/11/2019   Trochanteric bursitis of right hip 10/03/2018   Controlled substance agreement signed 03/22/2018   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   Obesity, class 1 09/05/2017   Chronic prescription benzodiazepine use 09/05/2017   Encounter for long-term (current) use of medications 08/12/2016   Osteoma 04/21/2016   Hypertriglyceridemia 03/10/2015   Abnormal tympanic membrane 03/10/2015   Social anxiety disorder 05/02/2014   Essential hypertension 05/02/2014   Osteoarthritis, multiple sites 05/02/2014   Essential tremor 07/28/2012   SCC (squamous cell carcinoma) 05/15/2007   PCP:  Catherine Charlies LABOR,  DO Pharmacy:   [facility] [address], [place] - [address] [place] [address] Phone: [phone] Fax: [phone]     Social Drivers of Health (SDOH) Social History: SDOH Screenings   Food Insecurity: No Food Insecurity (09/11/2024)  Housing: Low Risk (09/11/2024)  Transportation Needs: No Transportation Needs (09/11/2024)  Utilities: Not At Risk (09/11/2024)  Alcohol Screen: Low Risk (08/01/2024)  Depression (PHQ2-9): Low Risk (09/05/2024)  Financial Resource Strain: Low Risk (08/01/2024)  Physical Activity: Sufficiently Active (08/01/2024)  Social Connections: Moderately Integrated (09/11/2024)  Stress: No Stress Concern Present (08/01/2024)  Tobacco Use: Low  Risk (09/12/2024)  Health Literacy: Adequate Health Literacy (08/01/2024)   SDOH Interventions:     Readmission Risk Interventions     No data to display

## 2024-09-13 NOTE — Telephone Encounter (Signed)
 Pharmacy Patient Advocate Encounter  Insurance verification completed.    The patient is insured through [employer]. Patient has Medicare and is not eligible for a copay card, but may be able to apply for patient assistance or Medicare RX Payment Plan (Patient Must reach out to their plan, if eligible for payment plan), if available.    Ran test claim for Jardiance 10mg  tablet and the current 30 day co-pay is $158.54.   This test claim was processed through [facility]- copay amounts may vary at other pharmacies due to pharmacy/plan contracts, or as the patient moves through the different stages of their insurance plan.

## 2024-09-13 NOTE — Transfer of Care (Signed)
 Immediate Anesthesia Transfer of Care Note  Patient: Henry Knight  Procedure(s) Performed: CARDIOVERSION  Patient Location: PACU  Anesthesia Type:MAC  Level of Consciousness: drowsy  Airway & Oxygen Therapy: Patient Spontanous Breathing and Patient connected to nasal cannula oxygen  Post-op Assessment: Report given to RN and Post -op Vital signs reviewed and stable  Post vital signs: Reviewed and stable  Last Vitals:  Vitals Value Taken Time  BP 116/81 09/13/24 12:44  Temp    Pulse 66 09/13/24 12:45  Resp 10 09/13/24 12:45  SpO2 97 % 09/13/24 12:45  Vitals shown include unfiled device data.  Last Pain:  Vitals:   09/13/24 1125  TempSrc: Oral  PainSc: 0-No pain         Complications: No notable events documented.

## 2024-09-13 NOTE — Telephone Encounter (Signed)
 Pharmacy Patient Advocate Encounter  Insurance verification completed.    The patient is insured through [employer]. Patient has Medicare and is not eligible for a copay card, but may be able to apply for patient assistance or Medicare RX Payment Plan (Patient Must reach out to their plan, if eligible for payment plan), if available.    Ran test claim for Farxiga  10mg  tablet and the current 30 day co-pay is $151.05.  Ran test claim for Entresto 24-26mg  tablet and the current 30 day co-pay is $31.91.  This test claim was processed through [facility]- copay amounts may vary at other pharmacies due to pharmacy/plan contracts, or as the patient moves through the different stages of their insurance plan.

## 2024-09-13 NOTE — Anesthesia Preprocedure Evaluation (Signed)
 Anesthesia Evaluation  Patient identified by MRN, date of birth, ID band Patient awake    Reviewed: Allergy & Precautions, NPO status , Patient's Chart, lab work & pertinent test results  Airway Mallampati: III  TM Distance: >3 FB Neck ROM: Full    Dental no notable dental hx.    Pulmonary neg pulmonary ROS   Pulmonary exam normal        Cardiovascular hypertension, +CHF  Normal cardiovascular exam+ dysrhythmias Atrial Fibrillation   IMPRESSIONS   1. Left ventricular ejection fraction, by estimation, is 25 to 30%. Left  ventricular ejection fraction by 2D MOD biplane is 30.2 %. The left  ventricle has severely decreased function. The left ventricle demonstrates  global hypokinesis. There is moderate   concentric left ventricular hypertrophy.   2. Right ventricular systolic function is mildly reduced. The right  ventricular size is normal.   3. Left atrial size was moderately dilated. No left atrial/left atrial  appendage thrombus was detected.   4. Right atrial size was moderately dilated.   5. The mitral valve is grossly normal. Mild mitral valve regurgitation.   6. The aortic valve is tricuspid. Aortic valve regurgitation is mild.   7. The inferior vena cava is dilated in size with <50% respiratory  variability, suggesting right atrial pressure of 15 mmHg.   8. Evidence of atrial level shunting detected by color flow Doppler.  Agitated saline contrast bubble study was negative, with no evidence of  any interatrial shunt. There is a small patent foramen ovale with  predominantly left to right shunting across  the atrial septum.   9. Unable to cardiovert the patient.   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 D/w HF and primary cardiology.     Neuro/Psych  PSYCHIATRIC DISORDERS Anxiety      Neuromuscular  disease    GI/Hepatic negative GI ROS, Neg liver ROS,,,  Endo/Other  negative endocrine ROS    Renal/GU negative Renal ROS     Musculoskeletal  (+) Arthritis ,    Abdominal  (+) + obese  Peds  Hematology negative hematology ROS (+)   Anesthesia Other Findings A-fib  Reproductive/Obstetrics                              Anesthesia Physical Anesthesia Plan  ASA: 4  Anesthesia Plan: General   Post-op Pain Management:    Induction: Intravenous  PONV Risk Score and Plan: 2 and Treatment may vary due to age or medical condition  Airway Management Planned: Mask  Additional Equipment: None  Intra-op Plan:   Post-operative Plan:   Informed Consent: I have reviewed the patients History and Physical, chart, labs and discussed the procedure including the risks, benefits and alternatives for the proposed anesthesia with the patient or authorized representative who has indicated his/her understanding and acceptance.     Dental advisory given  Plan Discussed with: CRNA  Anesthesia Plan Comments:          Anesthesia Quick Evaluation

## 2024-09-13 NOTE — CV Procedure (Signed)
° °  DIRECT CURRENT CARDIOVERSION  NAME:  WYLAN Knight    MRN: [id number] DOB:  1951-05-20    ADMIT DATE: 09/11/2024  CARDIOVERSION:     Indications:  Symptomatic Atrial Flutter  Informed consent was obtained prior to the procedure. The risks, benefits and alternatives for the procedure were discussed and the patient comprehended these risks.  Risks include, but are not limited to treatment failure, burns to the chest, pain/discomfort, ventricular arrhythmia.   After a procedural time-out, sedation was performed by anesthesia. The patient had the defibrillator pads placed in the anterior and posterior position. Once an appropriate level of sedation was confirmed, the patient was cardioverted successfully with 125J of biphasic synchronized energy.  The patient converted to NSR.  There were no apparent complications.  The patient had normal neuro status and respiratory status post procedure with vitals stable as recorded elsewhere.  Adequate airway was maintained throughout and vital signs monitored per protocol.  COMPLICATIONS:    Complications: No complications Patient tolerated procedure well.  Morene Brownie Advanced Heart Failure 4:10 PM

## 2024-09-13 NOTE — Anesthesia Postprocedure Evaluation (Signed)
 Anesthesia Post Note  Patient: Henry Knight  Procedure(s) Performed: TRANSESOPHAGEAL ECHOCARDIOGRAM     Patient location during evaluation: Cath Lab Anesthesia Type: MAC Level of consciousness: awake Pain management: pain level controlled Vital Signs Assessment: post-procedure vital signs reviewed and stable Respiratory status: spontaneous breathing, nonlabored ventilation and respiratory function stable Cardiovascular status: blood pressure returned to baseline and stable Postop Assessment: no apparent nausea or vomiting Anesthetic complications: no   There were no known notable events for this encounter.  Last Vitals:  Vitals:   09/13/24 1315 09/13/24 1518  BP: 107/75 135/78  Pulse: 66 75  Resp: 16 18  Temp:    SpO2: 96% 98%    Last Pain:  Vitals:   09/13/24 1315  TempSrc:   PainSc: 0-No pain                 Kaycee Mcgaugh P Reginae Wolfrey

## 2024-09-14 ENCOUNTER — Encounter (HOSPITAL_COMMUNITY): Payer: Self-pay | Admitting: Cardiology

## 2024-09-14 ENCOUNTER — Other Ambulatory Visit (HOSPITAL_COMMUNITY): Payer: Self-pay

## 2024-09-14 DIAGNOSIS — N179 Acute kidney failure, unspecified: Secondary | ICD-10-CM

## 2024-09-14 DIAGNOSIS — E785 Hyperlipidemia, unspecified: Secondary | ICD-10-CM

## 2024-09-14 LAB — BASIC METABOLIC PANEL WITH GFR
Anion gap: 12 (ref 5–15)
BUN: 28 mg/dL — ABNORMAL HIGH (ref 8–23)
CO2: 21 mmol/L — ABNORMAL LOW (ref 22–32)
Calcium: 8.7 mg/dL — ABNORMAL LOW (ref 8.9–10.3)
Chloride: 105 mmol/L (ref 98–111)
Creatinine, Ser: 1.34 mg/dL — ABNORMAL HIGH (ref 0.61–1.24)
GFR, Estimated: 56 mL/min — ABNORMAL LOW (ref >60)
Glucose, Bld: 113 mg/dL — ABNORMAL HIGH (ref 70–99)
Potassium: 3.8 mmol/L (ref 3.5–5.1)
Sodium: 138 mmol/L (ref 135–145)

## 2024-09-14 LAB — MAGNESIUM: Magnesium: 2.1 mg/dL (ref 1.7–2.4)

## 2024-09-14 SURGERY — CARDIOVERSION (CATH LAB)
Anesthesia: General

## 2024-09-14 MED ORDER — APIXABAN 5 MG PO TABS
5.0000 mg | ORAL_TABLET | Freq: Two times a day (BID) | ORAL | 0 refills | Status: DC
  Filled 2024-09-14: qty 60, 30d supply, fill #0

## 2024-09-14 MED ORDER — POTASSIUM CHLORIDE CRYS ER 20 MEQ PO TBCR: 40.0000 meq | EXTENDED_RELEASE_TABLET | Freq: Once | ORAL | Status: DC

## 2024-09-14 MED ORDER — POTASSIUM CHLORIDE CRYS ER 20 MEQ PO TBCR
40.0000 meq | EXTENDED_RELEASE_TABLET | Freq: Every day | ORAL | 0 refills | Status: DC | PRN
  Filled 2024-09-14: qty 30, 15d supply, fill #0

## 2024-09-14 MED ORDER — EMPAGLIFLOZIN 10 MG PO TABS
10.0000 mg | ORAL_TABLET | Freq: Every day | ORAL | 0 refills | Status: DC
  Filled 2024-09-14: qty 30, 30d supply, fill #0

## 2024-09-14 MED ORDER — AMIODARONE HCL 200 MG PO TABS
400.0000 mg | ORAL_TABLET | Freq: Two times a day (BID) | ORAL | Status: DC
  Administered 2024-09-14: 400 mg via ORAL
  Filled 2024-09-14: qty 2

## 2024-09-14 MED ORDER — FUROSEMIDE 40 MG PO TABS: 40.0000 mg | ORAL_TABLET | Freq: Every day | ORAL | Status: DC | PRN

## 2024-09-14 MED ORDER — FUROSEMIDE 40 MG PO TABS
40.0000 mg | ORAL_TABLET | Freq: Every day | ORAL | 0 refills | Status: DC | PRN
  Filled 2024-09-14: qty 30, 30d supply, fill #0

## 2024-09-14 MED ORDER — POTASSIUM CHLORIDE CRYS ER 20 MEQ PO TBCR: 40.0000 meq | EXTENDED_RELEASE_TABLET | Freq: Every day | ORAL | Status: DC

## 2024-09-14 MED ORDER — POTASSIUM CHLORIDE CRYS ER 20 MEQ PO TBCR
20.0000 meq | EXTENDED_RELEASE_TABLET | Freq: Once | ORAL | Status: AC
  Administered 2024-09-14: 20 meq via ORAL
  Filled 2024-09-14: qty 1

## 2024-09-14 MED ORDER — METOPROLOL SUCCINATE ER 25 MG PO TB24
25.0000 mg | ORAL_TABLET | Freq: Every day | ORAL | 0 refills | Status: DC
  Filled 2024-09-14: qty 30, 30d supply, fill #0

## 2024-09-14 MED ORDER — AMIODARONE HCL 200 MG PO TABS
ORAL_TABLET | ORAL | 0 refills | Status: DC
  Filled 2024-09-14: qty 37, 30d supply, fill #0

## 2024-09-14 MED ORDER — FUROSEMIDE 40 MG PO TABS: 40.0000 mg | ORAL_TABLET | Freq: Every day | ORAL | Status: DC

## 2024-09-14 MED ORDER — METOPROLOL SUCCINATE ER 25 MG PO TB24
25.0000 mg | ORAL_TABLET | Freq: Every day | ORAL | Status: DC
  Administered 2024-09-14: 25 mg via ORAL
  Filled 2024-09-14: qty 1

## 2024-09-14 MED ORDER — SPIRONOLACTONE 25 MG PO TABS
25.0000 mg | ORAL_TABLET | Freq: Every day | ORAL | 0 refills | Status: DC
  Filled 2024-09-14: qty 30, 30d supply, fill #0

## 2024-09-14 MED ORDER — POTASSIUM CHLORIDE CRYS ER 20 MEQ PO TBCR: 40.0000 meq | EXTENDED_RELEASE_TABLET | Freq: Every day | ORAL | Status: DC | PRN

## 2024-09-14 MED ORDER — ATORVASTATIN CALCIUM 40 MG PO TABS
40.0000 mg | ORAL_TABLET | Freq: Every day | ORAL | 0 refills | Status: DC
  Filled 2024-09-14: qty 30, 30d supply, fill #0

## 2024-09-14 MED ORDER — SACUBITRIL-VALSARTAN 24-26 MG PO TABS
1.0000 | ORAL_TABLET | Freq: Two times a day (BID) | ORAL | 0 refills | Status: DC
  Filled 2024-09-14: qty 60, 30d supply, fill #0

## 2024-09-14 MED ORDER — EMPAGLIFLOZIN 10 MG PO TABS: 10.0000 mg | ORAL_TABLET | Freq: Every day | ORAL | Status: DC

## 2024-09-14 NOTE — Discharge Summary (Signed)
 Physician Discharge Summary   Patient: Henry Knight MRN: [id number] DOB: November 06, 1950  Admit date:     09/11/2024  Discharge date: 09/14/2024  Discharge Physician: Elidia Sieving Monish Haliburton   PCP: Catherine Charlies LABOR, DO   Recommendations at discharge:    Patient has been placed on guideline directed medical therapy for heart failure with entresto, spironolactone , SGLT 2 inh and metoprolol  succinate.  Diuresis with furosemide  40 mg as needed in case of volume overload, weight increase 2 to 3 lbs in 24 hrs or 5 lbs in 7 days.  Amiodarone  load with 400 mg daily for one week and then on 12/20 start taking 200 mg daily. Anticoagulation with apixaban   Follow up renal function and electrolytes in 7 days as outpatient  Follow up with Dr Catherine in 7 to 10 days Follow up with Cardiology as scheduled.   Discharge Diagnoses: Principal Problem:   Acute on chronic systolic CHF (congestive heart failure) (HCC) Active Problems:   Atrial flutter (HCC)   Essential hypertension   Hypertriglyceridemia   Essential tremor   Obesity, class 1  Resolved Problems:   * No resolved hospital problems. [facility] Course: Henry Knight was admitted to the hospital with the working diagnosis of heart failure exacerbation, complicated with atrial fibrillation.   73 yo male with the past medical history of pulmonary hypertension who presented with dyspnea. Positive symptoms for the last 2 weeks prior to admission, with worsening dyspnea over the last 24 hrs prior to admission, associated with orthopnea and PND. On hs initial physical examination his blood pressure was 149/99. 169/115, HR 122, RR 23 and 02 saturation 94% Lungs with rales bilaterally with no wheezing or rhonchi, heart with S1 and S2 present and tachycardic, irregularly irregular, abdomen with no distention, no lower extremity edema.   Na 138, K 4.5 Cl 106 bicarbonate 20, glucose 121, bun 23 cr 0,93  BNP 1,971 High sensitive troponin 43 and 39   Wbc 7.2 hgb 15,9 plt 238  D dimer 1,0  Sars covid 19 negative Influenza negative RSV negative   Chest radiograph with cardiomegaly, bilateral hilar vascular congestion, with bilateral central interstitial infiltrates, small bilateral pleural effusions.   EKG 123 bpm, left axis deviation, left anterior fascicular block, right bundle branch block, qtc 564, atrial flutter with PVC, with no significant ST segment or T wave changes.   CT chest with no evidence of acute pulmonary embolus.  Cardiomegaly with vascular congestion and diffuse interlobular septal thickening, small to moderate right pleural effusion and small left pleural effusions.  Enlargement of the pulmonary outflow tract.  Small non obstructing stone interpolar left kidney.    Patient placed on amiodarone  and IV furosemide .  12/10 Echocardiogram with biventricular failure, postponed direct current cardioversion.  Consulted heart failure team.  12/11 improved volume status, plan for DC cardioversion with conversion to sinus rhythm.  12/12 clinically with euvolemic state, plan to continue oral amiodarone  load as outpatient.   Assessment and Plan: * Acute on chronic systolic CHF (congestive heart failure) (HCC) Echocardiogram with reduced LV systolic function 25 to 30%, global hypokinesis and moderate concentric LVH, RV systolic function with mild reduction, RA with moderate dilatation, no significant valvular disease. Trivial pericardial effusion.  Patient was placed on IV furosemide  for diuresis, negative fluid balance was achieved, -  3,551 ml, with significant improvement in his symptoms.  Patient lost about 5 kg during this hospitalization.   Plan to continue medical therapy with spironolactone , entresto and SGLT 2 inh.  B  blockade with metoprolol  succinate 25 mg po daily.  Loop diuretic for volume control.   Atrial flutter [facility]) Patient had successful direct current cardioversion and was placed on IV amiodarone  for  rhythm control.   At the time of his discharge his telemetry is showing sinus rhythm with occasional PVC.  Plan to continue with oral amiodarone  and anticoagulation with apixaban . Added metoprolol  succinate 25 mg  Follow up as outpatient   Essential hypertension Continue with entresto and spironolactone  for RAAS inhibition.   AKI (acute kidney injury) Patient tolerated diuresis well, at the time of discharge his serum cr is 1,34 with K at 3,8 and serum bicarbonate at 21 Na 138   Plan to continue spironolactone  and furosemide  Follow up renal function and electrolytes as outpatient.   Hypertriglyceridemia Continue statin therapy   Essential tremor Continue with as needed lorazepam .   Dyslipidemia HDL 28, LDL 97 and triglycerides 96 Patient placed on statin, follow up as outpatient   Obesity, class 1 Calculated BMI is 31.4        Consultants: cardiology  Procedures performed: direct current cardiversion   Disposition: Home Diet recommendation:  Cardiac diet DISCHARGE MEDICATION: Allergies as of 09/14/2024       Reactions   Penicillins Rash, Other (See Comments)   Has patient had a PCN reaction causing immediate rash, facial/tongue/throat swelling, SOB or lightheadedness with hypotension: Unknown Has patient had a PCN reaction causing severe rash involving mucus membranes or skin necrosis: No Has patient had a PCN reaction that required hospitalization: No Has patient had a PCN reaction occurring within the last 10 years: No If all of the above answers are NO, then may proceed with Cephalosporin use.        Medication List     STOP taking these medications    ibuprofen  200 MG tablet Commonly known as: ADVIL        TAKE these medications    amiodarone  200 MG tablet Commonly known as: PACERONE  Take 2 tablets daily for one week, and then on 09/22/24 start taking one tablet daily.   apixaban  5 MG Tabs tablet Commonly known as: ELIQUIS  Take 1 tablet (5  mg total) by mouth 2 (two) times daily.   atorvastatin  40 MG tablet Commonly known as: LIPITOR Take 1 tablet (40 mg total) by mouth daily. Start taking on: September 15, 2024   empagliflozin 10 MG Tabs tablet Commonly known as: JARDIANCE Take 1 tablet (10 mg total) by mouth daily. Start taking on: September 15, 2024   furosemide  40 MG tablet Commonly known as: LASIX  Take 1 tablet (40 mg total) by mouth daily as needed for edema or fluid (in case of weight gain 2 to 3 lbs in 24 hrs or 5 lbs in 7 days).   LORazepam  1 MG tablet Commonly known as: ATIVAN  Take 1 tablet (1 mg total) by mouth 2 (two) times daily as needed for anxiety.   metoprolol  succinate 25 MG 24 hr tablet Commonly known as: TOPROL -XL Take 1 tablet (25 mg total) by mouth daily. Start taking on: September 15, 2024   omega-3 acid ethyl esters 1 g capsule Commonly known as: LOVAZA  Take 2 capsules (2 g total) by mouth 2 (two) times daily.   potassium chloride SA 20 MEQ tablet Commonly known as: KLOR-CON M Take 2 tablets (40 mEq total) by mouth daily as needed (take only when taking furosemide ).   sacubitril-valsartan 24-26 MG Commonly known as: ENTRESTO Take 1 tablet by mouth 2 (two) times daily.  spironolactone  25 MG tablet Commonly known as: ALDACTONE  Take 1 tablet (25 mg total) by mouth daily. Start taking on: September 15, 2024   traMADol  50 MG tablet Commonly known as: ULTRAM  Take 1-2 tablets (50-100 mg total) by mouth every 12 (twelve) hours as needed (mild pain).   TYLENOL  PO Take 2-4 tablets by mouth as needed.        Discharge Exam: Filed Weights   09/12/24 0427 09/13/24 0416 09/14/24 0500  Weight: 105.3 kg 103.5 kg 101.2 kg   BP 116/86 (BP Location: Right Arm)   Pulse 66   Temp 97.6 F (36.4 C) (Oral)   Resp 18   Ht 6' (1.829 m)   Wt 101.2 kg   SpO2 94%   BMI 30.27 kg/m   Patient with no chest pain and no dyspnea, no PND, orthopnea or lower extremity edema  Neurology awake and  alert ENT with mild pallor with no icterus Cardiovascular with S1 and S2 present and regular with no gallops, rubs or murmurs Respiratory with no rales or wheezing, no rhonchi  Abdomen with no distention  No lower extremity edema, compression socks in place.   Condition at discharge: stable  The results of significant diagnostics from this hospitalization (including imaging, microbiology, ancillary and laboratory) are listed below for reference.   Imaging Studies: EP STUDY Result Date: 09/13/2024 See surgical note for result.  VAS US  LOWER EXTREMITY VENOUS (DVT) Result Date: 09/12/2024  Lower Venous DVT Study Patient Name:  Henry Knight  Date of Exam:   09/12/2024 Medical Rec #: [id number]             Accession #:    [id number] Date of Birth: 1950-11-26             Patient Gender: M Patient Age:   42 years Exam Location:  [facility] Procedure:      VAS US  LOWER EXTREMITY VENOUS (DVT) Referring Phys: LAVADA [facility] --------------------------------------------------------------------------------  Indications: Swelling.  Risk Factors: None identified. Limitations: Poor ultrasound/tissue interface. Comparison Study: No prior studies. Performing Technologist: Cordella Collet RVT  Examination Guidelines: A complete evaluation includes B-mode imaging, spectral Doppler, color Doppler, and power Doppler as needed of all accessible portions of each vessel. Bilateral testing is considered an integral part of a complete examination. Limited examinations for reoccurring indications may be performed as noted. The reflux portion of the exam is performed with the patient in reverse Trendelenburg.  +---------+---------------+---------+-----------+----------+--------------+ RIGHT    CompressibilityPhasicitySpontaneityPropertiesThrombus Aging +---------+---------------+---------+-----------+----------+--------------+ CFV      Full           Yes      Yes                                  +---------+---------------+---------+-----------+----------+--------------+ SFJ      Full                                                        +---------+---------------+---------+-----------+----------+--------------+ FV Prox  Full                                                        +---------+---------------+---------+-----------+----------+--------------+  FV Mid   Full                                                        +---------+---------------+---------+-----------+----------+--------------+ FV DistalFull                                                        +---------+---------------+---------+-----------+----------+--------------+ PFV      Full                                                        +---------+---------------+---------+-----------+----------+--------------+ POP      Full           Yes      Yes                                 +---------+---------------+---------+-----------+----------+--------------+ PTV      Full                                                        +---------+---------------+---------+-----------+----------+--------------+ PERO     Full                                                        +---------+---------------+---------+-----------+----------+--------------+   +---------+---------------+---------+-----------+----------+--------------+ LEFT     CompressibilityPhasicitySpontaneityPropertiesThrombus Aging +---------+---------------+---------+-----------+----------+--------------+ CFV      Full           Yes      Yes                                 +---------+---------------+---------+-----------+----------+--------------+ SFJ      Full                                                        +---------+---------------+---------+-----------+----------+--------------+ FV Prox  Full                                                         +---------+---------------+---------+-----------+----------+--------------+ FV Mid   Full                                                        +---------+---------------+---------+-----------+----------+--------------+  FV DistalFull                                                        +---------+---------------+---------+-----------+----------+--------------+ PFV      Full                                                        +---------+---------------+---------+-----------+----------+--------------+ POP      Full           Yes      Yes                                 +---------+---------------+---------+-----------+----------+--------------+ PTV      Full                                                        +---------+---------------+---------+-----------+----------+--------------+ PERO     Full                                                        +---------+---------------+---------+-----------+----------+--------------+     Summary: RIGHT: - There is no evidence of deep vein thrombosis in the lower extremity.  - No cystic structure found in the popliteal fossa.  LEFT: - There is no evidence of deep vein thrombosis in the lower extremity.  - No cystic structure found in the popliteal fossa.  *See table(s) above for measurements and observations. Electronically signed by Debby Robertson on 09/12/2024 at 10:14:07 PM.    Final    ECHO TEE Result Date: 09/12/2024    TRANSESOPHOGEAL ECHO REPORT   Patient Name:   Henry Knight Date of Exam: 09/12/2024 Medical Rec #:  [id number]            Height:       72.0 in Accession #:    [id number]           Weight:       232.2 lb Date of Birth:  [birth year]            BSA:          2.270 m Patient Age:    73 years             BP:           113/80 mmHg Patient Gender: M                    HR:           112 bpm. Exam Location:  Inpatient Procedure: Transesophageal Echo, Cardiac Doppler, Color Doppler and Saline             Contrast Bubble Study (Both Spectral and Color Flow Doppler were            utilized during procedure).  Indications:     Atrial Fibrillation  History:         Patient has no prior history of Echocardiogram examinations.                  CHF, Arrythmias:Atrial Fibrillation; Risk Factors:Hypertension.  Sonographer:     Merlynn Argyle Referring Phys:  [id number] LEONTINE SAILOR LOCKWOOD Diagnosing Phys: Vinie Maxcy MD PROCEDURE: After discussion of the risks and benefits of a TEE, an informed consent was obtained from the patient. The transesophogeal probe was passed without difficulty through the esophogus of the patient. Imaged were obtained with the patient in a supine position. Sedation performed by different physician. The patient was monitored while under deep sedation. Anesthestetic sedation was provided intravenously by Anesthesiology: 60mg  of Propofol , 277.42mg  of Lidocaine. Supplementary images were obtained from transthoracic windows as indicated to answer the clinical question. The patient developed no complications during the procedure. An unsuccessful direct current cardioversion was performed. Unable to cardiovert the patient.  IMPRESSIONS  1. Left ventricular ejection fraction, by estimation, is 25 to 30%. Left ventricular ejection fraction by 2D MOD biplane is 30.2 %. The left ventricle has severely decreased function. The left ventricle demonstrates global hypokinesis. There is moderate  concentric left ventricular hypertrophy.  2. Right ventricular systolic function is mildly reduced. The right ventricular size is normal.  3. Left atrial size was moderately dilated. No left atrial/left atrial appendage thrombus was detected.  4. Right atrial size was moderately dilated.  5. The mitral valve is grossly normal. Mild mitral valve regurgitation.  6. The aortic valve is tricuspid. Aortic valve regurgitation is mild.  7. The inferior vena cava is dilated in size with <50% respiratory variability, suggesting right  atrial pressure of 15 mmHg.  8. Evidence of atrial level shunting detected by color flow Doppler. Agitated saline contrast bubble study was negative, with no evidence of any interatrial shunt. There is a small patent foramen ovale with predominantly left to right shunting across the atrial septum.  9. Unable to cardiovert the patient. 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 D/w HF and primary cardiology. FINDINGS  Left Ventricle: Left ventricular ejection fraction, by estimation, is 25 to 30%. Left ventricular ejection fraction by 2D MOD biplane is 30.2 %. The left ventricle has severely decreased function. The left ventricle demonstrates global hypokinesis. The left ventricular internal cavity size was normal in size. There is moderate concentric left ventricular hypertrophy. Right Ventricle: The right ventricular size is normal. No increase in right ventricular wall thickness. Right ventricular systolic function is mildly reduced. Left Atrium: Left atrial size was moderately dilated. No left atrial/left atrial appendage thrombus was detected. Right Atrium: Right atrial size was moderately dilated. Pericardium: Trivial pericardial effusion is present. Mitral Valve: The mitral valve is grossly normal. Mild mitral valve regurgitation. Tricuspid Valve: The tricuspid valve is grossly normal. Tricuspid valve regurgitation is trivial. Aortic Valve: The aortic valve is tricuspid. Aortic valve regurgitation is mild. Pulmonic Valve: The pulmonic valve was grossly normal. Pulmonic valve regurgitation is trivial. Aorta: The aortic root and ascending aorta are structurally normal, with no evidence of dilitation. Venous: The inferior vena cava is dilated in size with less than 50% respiratory variability, suggesting right atrial pressure of 15 mmHg. IAS/Shunts: Evidence of atrial level shunting detected by color  flow Doppler. Agitated saline contrast was given intravenously to evaluate for intracardiac shunting. Agitated saline contrast bubble study was negative, with no evidence of any interatrial  shunt. A small patent foramen ovale is detected with predominantly left to right shunting across the atrial septum. Additional Comments: Spectral Doppler performed. LEFT VENTRICLE PLAX 2D                        Biplane EF (MOD) LVIDd:         5.40 cm         LV Biplane EF:   Left LVIDs:         4.30 cm                          ventricular LV PW:         1.30 cm                          ejection LV IVS:        1.40 cm                          fraction by                                                 2D MOD                                                 biplane is LV Volumes (MOD)                                30.2 %. LV vol d, MOD    69.1 ml A2C: LV vol d, MOD    100.1 ml A4C: LV vol s, MOD    39.2 ml A2C: LV vol s, MOD    75.2 ml A4C: LV SV MOD A2C:   29.9 ml LV SV MOD A4C:   100.1 ml LV SV MOD BP:    25.9 ml IVC IVC diam: 2.50 cm RIGHT ATRIUM RA Area:     26.87 cm RA Volume:   90.75 ml Vinie Maxcy MD Electronically signed by Vinie Maxcy MD Signature Date/Time: 09/12/2024/1:46:57 PM    Final    EP STUDY Result Date: 09/12/2024 See surgical note for result.  CT Angio Chest Pulmonary Embolism (PE) W or WO Contrast Addendum Date: 09/11/2024 ADDENDUM REPORT: 09/11/2024 09:41 ADDENDUM: As noted in the body of the report, the ascending thoracic aorta measures 4.5 cm diameter. Ascending thoracic aortic aneurysm. Recommend semi-annual imaging followup by CTA or MRA and referral to cardiothoracic surgery if not already obtained. This recommendation follows 2010 ACCF/AHA/AATS/ACR/ASA/SCA/SCAI/SIR/STS/SVM Guidelines for the Diagnosis and Management of Patients With Thoracic Aortic Disease. Circulation. 2010; 121: Z733-z630. Aortic aneurysm NOS (ICD10-I71.9) Electronically Signed   By: Camellia Candle M.D.   On: 09/11/2024  09:41   Result Date: 09/11/2024 CLINICAL DATA:  Worsening shortness of breath. EXAM: CT ANGIOGRAPHY CHEST WITH CONTRAST TECHNIQUE: Multidetector CT imaging of the chest was performed using the standard protocol during bolus administration of intravenous contrast. Multiplanar CT image reconstructions and MIPs were obtained to evaluate the vascular anatomy. RADIATION DOSE REDUCTION: This exam was performed according to the departmental  dose-optimization program which includes automated exposure control, adjustment of the mA and/or kV according to patient size and/or use of iterative reconstruction technique. CONTRAST:  OMNIPAQUE  IOHEXOL  350 MG/ML SOLN COMPARISON:  None Available. FINDINGS: Cardiovascular: The heart is enlarged. No substantial pericardial effusion. Coronary artery calcification is evident. Ascending thoracic aorta measures 4.5 cm diameter. Enlargement of the pulmonary outflow tract/main pulmonary arteries suggests pulmonary arterial hypertension. There is no filling defect within the opacified pulmonary arteries to suggest the presence of an acute pulmonary embolus. Mediastinum/Nodes: No mediastinal lymphadenopathy. Calcified nodal tissue in the hilar regions bilaterally suggest old granulomatous disease. The esophagus has normal imaging features. There is no axillary lymphadenopathy. Lungs/Pleura: Small to moderate right and small left pleural effusions. Diffuse interlobular septal thickening is noted in both lungs suggesting edema. Vascular congestion evident with basilar central bronchial wall thickening and collapse/consolidation in the lung bases bilaterally. Upper Abdomen: Multiple cysts are noted in both kidneys, right greater than left. Most of the cystic lesions have simple features. 2.1 cm exophytic lesion upper pole right kidney on image 163 has attenuation higher than would be expected for a simple cyst likely a cyst complicated by proteinaceous debris or hemorrhage. Tiny  nonobstructing stone identified interpolar left kidney. Musculoskeletal: No worrisome lytic or sclerotic osseous abnormality. Review of the MIP images confirms the above findings. IMPRESSION: 1. No CT evidence for acute pulmonary embolus. 2. Cardiomegaly with vascular congestion and diffuse interlobular septal thickening suggesting edema. 3. Small to moderate right and small left pleural effusions with collapse/consolidation in the lung bases bilaterally. 4. Enlargement of the pulmonary outflow tract/main pulmonary arteries suggests pulmonary arterial hypertension. 5. Tiny nonobstructing stone interpolar left kidney. Electronically Signed: By: Camellia Candle M.D. On: 09/11/2024 09:35   DG Chest 2 View Result Date: 09/11/2024 EXAM: 2 VIEW(S) XRAY OF THE CHEST 09/11/2024 08:15:13 AM COMPARISON: Right rib series dated 02/10/2016. CLINICAL HISTORY: cough, chest pain FINDINGS: LUNGS AND PLEURA: There are hazy and reticular opacities present bilaterally, which may reflect edema or diffuse pneumonitis. There is mild peribronchial cuffing. No pleural effusion. No pneumothorax. HEART AND MEDIASTINUM: No acute abnormality of the cardiac and mediastinal silhouettes. BONES AND SOFT TISSUES: No acute osseous abnormality. IMPRESSION: 1. Hazy and reticular opacities bilaterally, possibly representing edema or diffuse pneumonitis. 2. Mild peribronchial cuffing. Electronically signed by: Evalene Coho MD 09/11/2024 08:36 AM EST RP Workstation: [id number]    Microbiology: Results for orders placed or performed during the hospital encounter of 09/11/24  Resp panel by RT-PCR (RSV, Flu A&B, Covid) Anterior Nasal Swab     Status: None   Collection Time: 09/11/24  7:56 AM   Specimen: Anterior Nasal Swab  Result Value Ref Range Status   SARS Coronavirus 2 by RT PCR NEGATIVE NEGATIVE Final    Comment: (NOTE) SARS-CoV-2 target nucleic acids are NOT DETECTED.  The SARS-CoV-2 RNA is generally detectable in upper  respiratory specimens during the acute phase of infection. The lowest concentration of SARS-CoV-2 viral copies this assay can detect is 138 copies/mL. A negative result does not preclude SARS-Cov-2 infection and should not be used as the sole basis for treatment or other patient management decisions. A negative result may occur with  improper specimen collection/handling, submission of specimen other than nasopharyngeal swab, presence of viral mutation(s) within the areas targeted by this assay, and inadequate number of viral copies(<138 copies/mL). A negative result must be combined with clinical observations, patient history, and epidemiological information. The expected result is Negative.  Fact Sheet for Patients:  bloggercourse.com  Fact Sheet for Healthcare Providers:  seriousbroker.it  This test is no t yet approved or cleared by the [place]  FDA and  has been authorized for detection and/or diagnosis of SARS-CoV-2 by FDA under an Emergency Use Authorization (EUA). This EUA will remain  in effect (meaning this test can be used) for the duration of the COVID-19 declaration under Section 564(b)(1) of the Act, 21 U.S.C.section 360bbb-3(b)(1), unless the authorization is terminated  or revoked sooner.       Influenza A by PCR NEGATIVE NEGATIVE Final   Influenza B by PCR NEGATIVE NEGATIVE Final    Comment: (NOTE) The Xpert Xpress SARS-CoV-2/FLU/RSV plus assay is intended as an aid in the diagnosis of influenza from Nasopharyngeal swab specimens and should not be used as a sole basis for treatment. Nasal washings and aspirates are unacceptable for Xpert Xpress SARS-CoV-2/FLU/RSV testing.  Fact Sheet for Patients: bloggercourse.com  Fact Sheet for Healthcare Providers: seriousbroker.it  This test is not yet approved or cleared by the [place]  FDA and has been  authorized for detection and/or diagnosis of SARS-CoV-2 by FDA under an Emergency Use Authorization (EUA). This EUA will remain in effect (meaning this test can be used) for the duration of the COVID-19 declaration under Section 564(b)(1) of the Act, 21 U.S.C. section 360bbb-3(b)(1), unless the authorization is terminated or revoked.     Resp Syncytial Virus by PCR NEGATIVE NEGATIVE Final    Comment: (NOTE) Fact Sheet for Patients: bloggercourse.com  Fact Sheet for Healthcare Providers: seriousbroker.it  This test is not yet approved or cleared by the [place]  FDA and has been authorized for detection and/or diagnosis of SARS-CoV-2 by FDA under an Emergency Use Authorization (EUA). This EUA will remain in effect (meaning this test can be used) for the duration of the COVID-19 declaration under Section 564(b)(1) of the Act, 21 U.S.C. section 360bbb-3(b)(1), unless the authorization is terminated or revoked.  Performed at [facility], [address] [place], [place] [address]     Labs: CBC: Recent Labs  Lab 09/11/24 0757 09/12/24 0433  WBC 7.2 7.5  NEUTROABS  --  4.5  HGB 15.9 15.7  HCT 44.9 45.1  MCV 93.3 96.2  PLT 238 225   Basic Metabolic Panel: Recent Labs  Lab 09/11/24 0757 09/11/24 1741 09/12/24 0433 09/13/24 0451 09/14/24 0312  NA 138  --  139 137 138  K 4.5  --  4.4 3.6 3.8  CL 106  --  109 104 105  CO2 20*  --  22 23 21*  GLUCOSE 121*  --  105* 107* 113*  BUN 23  --  20 23 28*  CREATININE 0.93  --  1.18 1.13 1.34*  CALCIUM  9.2  --  8.9 8.6* 8.7*  MG  --  2.0 2.1 2.1 2.1   Liver Function Tests: Recent Labs  Lab 09/11/24 0757 09/12/24 0433  AST 33 29  ALT 51* 38  ALKPHOS 81 59  BILITOT 0.8 0.9  PROT 7.2 6.4*  ALBUMIN 4.5 3.6   CBG: Recent Labs  Lab 09/11/24 0754  GLUCAP 121*    Discharge time spent: greater than 30 minutes.  Signed: Elidia Toribio Furnace,  MD [facility] 09/14/2024

## 2024-09-14 NOTE — Anesthesia Postprocedure Evaluation (Signed)
 Anesthesia Post Note  Patient: Henry Knight  Procedure(s) Performed: CARDIOVERSION     Patient location during evaluation: PACU Anesthesia Type: General Level of consciousness: awake and alert Pain management: pain level controlled Vital Signs Assessment: post-procedure vital signs reviewed and stable Respiratory status: spontaneous breathing, nonlabored ventilation, respiratory function stable and patient connected to nasal cannula oxygen Cardiovascular status: blood pressure returned to baseline and stable Postop Assessment: no apparent nausea or vomiting Anesthetic complications: no   No notable events documented.  Last Vitals:  Vitals:   09/14/24 0049 09/14/24 0634  BP: 112/71 116/86  Pulse: 82 66  Resp: 18 16  Temp: 36.9 C 36.9 C  SpO2: 93% 94%    Last Pain:  Vitals:   09/14/24 0634  TempSrc: Oral  PainSc:                  Henry Knight Henry Knight

## 2024-09-14 NOTE — Assessment & Plan Note (Signed)
 HDL 28, LDL 97 and triglycerides 96 Patient placed on statin, follow up as outpatient

## 2024-09-14 NOTE — Plan of Care (Signed)
  Problem: Clinical Measurements: Goal: Ability to maintain clinical measurements within normal limits will improve Outcome: Progressing   Problem: Health Behavior/Discharge Planning: Goal: Ability to manage health-related needs will improve Outcome: Progressing   

## 2024-09-14 NOTE — TOC Transition Note (Addendum)
 Transition of Care [facility]) - Discharge Note   Patient Details  Name: Henry Knight MRN: [id number] Date of Birth: 10-02-1951  Transition of Care [facility]) CM/SW Contact:  Justina Delcia Czar, RN Phone Number: 09/14/2024, 1:04 PM   Clinical Narrative:     Potential Code 44, message sent to UR, Terri to review.  No Code 44 needed. Message sent to dc team.   Discharge Plan and Services Additional resources added to the After Visit Summary for     Social Drivers of Health (SDOH) Interventions SDOH Screenings   Food Insecurity: No Food Insecurity (09/11/2024)  Housing: Low Risk (09/11/2024)  Transportation Needs: No Transportation Needs (09/11/2024)  Utilities: Not At Risk (09/11/2024)  Alcohol Screen: Low Risk (08/01/2024)  Depression (PHQ2-9): Low Risk (09/05/2024)  Financial Resource Strain: Low Risk (08/01/2024)  Physical Activity: Sufficiently Active (08/01/2024)  Social Connections: Moderately Integrated (09/11/2024)  Stress: No Stress Concern Present (08/01/2024)  Tobacco Use: Low Risk (09/12/2024)  Health Literacy: Adequate Health Literacy (08/01/2024)     Readmission Risk Interventions    09/14/2024   12:53 PM  Readmission Risk Prevention Plan  Post Dischage Appt Complete  Medication Screening Complete  Transportation Screening Complete

## 2024-09-14 NOTE — Assessment & Plan Note (Signed)
 Patient tolerated diuresis well, at the time of discharge his serum cr is 1,34 with K at 3,8 and serum bicarbonate at 21 Na 138   Plan to continue spironolactone  and furosemide  Follow up renal function and electrolytes as outpatient.

## 2024-09-14 NOTE — Discharge Instructions (Signed)
Information on my medicine - ELIQUIS (apixaban)  This medication education was reviewed with me or my healthcare representative as part of my discharge preparation.  The pharmacist that spoke with me during my hospital stay was:  Einar Grad, [facility]  Why was Eliquis prescribed for you? Eliquis was prescribed for you to reduce the risk of a blood clot forming that can cause a stroke if you have a medical condition called atrial fibrillation (a type of irregular heartbeat).  What do You need to know about Eliquis ? Take your Eliquis TWICE DAILY - one tablet in the morning and one tablet in the evening with or without food. If you have difficulty swallowing the tablet whole please discuss with your pharmacist how to take the medication safely.  Take Eliquis exactly as prescribed by your doctor and DO NOT stop taking Eliquis without talking to the doctor who prescribed the medication.  Stopping may increase your risk of developing a stroke.  Refill your prescription before you run out.  After discharge, you should have regular check-up appointments with your healthcare provider that is prescribing your Eliquis.  In the future your dose may need to be changed if your kidney function or weight changes by a significant amount or as you get older.  What do you do if you miss a dose? If you miss a dose, take it as soon as you remember on the same day and resume taking twice daily.  Do not take more than one dose of ELIQUIS at the same time to make up a missed dose.  Important Safety Information A possible side effect of Eliquis is bleeding. You should call your healthcare provider right away if you experience any of the following: Bleeding from an injury or your nose that does not stop. Unusual colored urine (red or dark brown) or unusual colored stools (red or black). Unusual bruising for unknown reasons. A serious fall or if you hit your head (even if there is no bleeding).  Some  medicines may interact with Eliquis and might increase your risk of bleeding or clotting while on Eliquis. To help avoid this, consult your healthcare provider or pharmacist prior to using any new prescription or non-prescription medications, including herbals, vitamins, non-steroidal anti-inflammatory drugs (NSAIDs) and supplements.  This website has more information on Eliquis (apixaban): http://www.eliquis.com/eliquis/home

## 2024-09-14 NOTE — Progress Notes (Signed)
 Advanced Heart Failure Rounding Note  Cardiologist: Lonni LITTIE Nanas, MD  HF Cardiologist: Dr. Rolan  Chief Complaint: Acute BiV Heart Failure Subjective:    S/p DCCV to SR on 12/11.  Continues on IV lasix . Is/Os not recorded.  Weight down another 5 lb.   Feels great. Wondering when he can go home.   Objective:    Weight Range: 101.2 kg Body mass index is 30.27 kg/m.   Vital Signs:   Temp:  [97.7 F (36.5 C)-98.4 F (36.9 C)] 98.4 F (36.9 C) (12/12 0634) Pulse Rate:  [62-96] 66 (12/12 0634) Resp:  [10-18] 16 (12/12 0634) BP: (104-135)/(71-96) 116/86 (12/12 0634) SpO2:  [93 %-98 %] 94 % (12/12 0634) Weight:  [101.2 kg] 101.2 kg (12/12 0500) Last BM Date : 09/10/24  Weight change: Filed Weights   09/12/24 0427 09/13/24 0416 09/14/24 0500  Weight: 105.3 kg 103.5 kg 101.2 kg   Intake/Output:  Intake/Output Summary (Last 24 hours) at 09/14/2024 0733 Last data filed at 09/14/2024 0326 Gross per 24 hour  Intake 231.36 ml  Output --  Net 231.36 ml    Physical Exam    General:  Sitting on side of bed.  Cor: No JVD. Regular rate & rhythm. No murmurs. Lungs: clear Abdomen: soft, nontender, nondistended. Extremities: no edema Neuro: alert & orientedx3. Affect pleasant   Telemetry    SR 60s-70s, occasional PVCs  Labs    CBC Recent Labs    09/11/24 0757 09/12/24 0433  WBC 7.2 7.5  NEUTROABS  --  4.5  HGB 15.9 15.7  HCT 44.9 45.1  MCV 93.3 96.2  PLT 238 225   Basic Metabolic Panel Recent Labs    87/88/74 0451 09/14/24 0312  NA 137 138  K 3.6 3.8  CL 104 105  CO2 23 21*  GLUCOSE 107* 113*  BUN 23 28*  CREATININE 1.13 1.34*  CALCIUM  8.6* 8.7*  MG 2.1 2.1   Liver Function Tests Recent Labs    09/11/24 0757 09/12/24 0433  AST 33 29  ALT 51* 38  ALKPHOS 81 59  BILITOT 0.8 0.9  PROT 7.2 6.4*  ALBUMIN 4.5 3.6   ProBNP (last 3 results) Recent Labs    09/11/24 0757  PROBNP 1,971.0*   D-Dimer Recent Labs     09/11/24 0757  DDIMER 1.09*   Fasting Lipid Panel Recent Labs    09/12/24 0433  CHOL 144  HDL 28*  LDLCALC 97  TRIG 96  CHOLHDL 5.1   Thyroid  Function Tests Recent Labs    09/12/24 0433  TSH 7.227*   Medications:    Scheduled Medications:  apixaban   5 mg Oral BID   atorvastatin   40 mg Oral Daily   dapagliflozin  propanediol  10 mg Oral Daily   feeding supplement  237 mL Oral BID BM   furosemide   80 mg Intravenous BID   sacubitril-valsartan  1 tablet Oral BID   spironolactone   25 mg Oral Daily    Infusions:  amiodarone  30 mg/hr (09/14/24 0326)    PRN Medications: acetaminophen  **OR** acetaminophen , LORazepam , ondansetron  **OR** ondansetron  (ZOFRAN ) IV, senna-docusate  Assessment/Plan   1. Acute Systolic Heart Failure: TEE 12/10 showed EF 25-30% mod LVH, mildly RV dysfunction, mild AI, IVC dilated. No prior cardiac history. Father died of cardiac disease. 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 Although can not rule out ischemic causes, no RWMA on echo. Consider ischemic eval in the future.  - Volume looks good. Stop IV lasix . PRN lasix  at discharge -  continue spiro 25 mg daily - switch farxiga  to jardiance so he can get 30 day free card - continue entresto 24/26 mg bid - Start 25 mg toprol  xl daily - May need to look at assistance for medications after the new year. - TED hose - may need R/LHC down the road if EF does not recover - Sleep study as outpatient  2. Atrial Flutter: New diagnosis. 2:1 with VR 100-120s.  Had TEE 12/10 but DCCV deferred until he has had adequate diuresis. S/p DCCV to SR on 12/11.  - Switch IV amiodarone  to po 400 BID X 1 week then 200 mg daily - on eliquis  5 mg bid - Refer to EP at f/u to consider ablation  3. ?Pulmonary Hypertension: CTA with dilated main pulmonary arteries, suggestive of PAH, no ILD. TEE with mild RV dysfunction. No history of pulmonary disease. Never smoked. Has never had a sleep study. May need cath  in the future.  4. PFO: Small, noted on TEE with mainly L>R shunting.   5. HLD: LDL 97. Continue atorvastatin  40 mg daily  6. Vericose Veins: compression hose  Okay for discharge from HF standpoint.  Follow-up in clinic scheduled.  HF meds at discharge: Amiodarone  400 mg BID X 1 week then reduce to 200 mg daily Eliquis  5 mg BID, 30 day free card Atorvastatin  40 mg daily Jardiance 10 mg daily, 30 day free card Metoprolol  xl 25 mg daily Entresto 24/26 mg BID Spironolactone  25 mg daily Lasix  40 mg PRN Potasisum chloride 40 mEq PRN when taking lasix    Length of Stay: 2  Henry Knight N, PA-C  09/14/2024, 7:33 AM  Advanced Heart Failure Team Pager [phone] (M-F; 7a - 5p)  Please contact [facility] for night-coverage after hours (5p -7a ) and weekends on [email or web address]

## 2024-09-14 NOTE — TOC Transition Note (Signed)
 Transition of Care [facility]) - Discharge Note   Patient Details  Name: Henry Knight MRN: [id number] Date of Birth: 1950-12-24  Transition of Care [facility]) CM/SW Contact:  Arlana JINNY Nicholaus ISRAEL Phone Number: [phone] 09/14/2024, 12:09 PM   Clinical Narrative:   HF CSW called to schedule patients hospital follow up appointment for Monday, September 17, 2024 at 11:40 AM.  Wife will provide transportation at dc.           Patient Goals and CMS Choice            Discharge Placement                       Discharge Plan and Services Additional resources added to the After Visit Summary for                                       Social Drivers of Health (SDOH) Interventions SDOH Screenings   Food Insecurity: No Food Insecurity (09/11/2024)  Housing: Low Risk (09/11/2024)  Transportation Needs: No Transportation Needs (09/11/2024)  Utilities: Not At Risk (09/11/2024)  Alcohol Screen: Low Risk (08/01/2024)  Depression (PHQ2-9): Low Risk (09/05/2024)  Financial Resource Strain: Low Risk (08/01/2024)  Physical Activity: Sufficiently Active (08/01/2024)  Social Connections: Moderately Integrated (09/11/2024)  Stress: No Stress Concern Present (08/01/2024)  Tobacco Use: Low Risk (09/12/2024)  Health Literacy: Adequate Health Literacy (08/01/2024)     Readmission Risk Interventions     No data to display

## 2024-09-14 NOTE — Progress Notes (Signed)
 DISCHARGE NOTE HOME RIPKEN REKOWSKI to be discharged Home per MD order. Discussed prescriptions and follow up appointments with the patient. Prescriptions given to patient; medication list explained in detail. Patient verbalized understanding.  Skin clean, dry and intact without evidence of skin break down, no evidence of skin tears noted. IV catheter discontinued intact. Site without signs and symptoms of complications. Dressing and pressure applied. Pt denies pain at the site currently. No complaints noted.  Patient free of lines, drains, and wounds.   An After Visit Summary (AVS) was printed and given to the patient. Patient escorted via wheelchair, and discharged home via private auto.  Wilmoth Rasnic K Luisdavid Hamblin, RN

## 2024-09-17 ENCOUNTER — Telehealth: Payer: Self-pay

## 2024-09-17 ENCOUNTER — Ambulatory Visit: Payer: Self-pay | Admitting: Family Medicine

## 2024-09-17 ENCOUNTER — Encounter: Payer: Self-pay | Admitting: Family Medicine

## 2024-09-17 ENCOUNTER — Ambulatory Visit: Admitting: Family Medicine

## 2024-09-17 VITALS — BP 124/82 | HR 57 | Temp 97.9°F | Wt 228.6 lb

## 2024-09-17 DIAGNOSIS — I483 Typical atrial flutter: Secondary | ICD-10-CM

## 2024-09-17 DIAGNOSIS — E785 Hyperlipidemia, unspecified: Secondary | ICD-10-CM

## 2024-09-17 DIAGNOSIS — R7989 Other specified abnormal findings of blood chemistry: Secondary | ICD-10-CM

## 2024-09-17 DIAGNOSIS — I4891 Unspecified atrial fibrillation: Secondary | ICD-10-CM

## 2024-09-17 DIAGNOSIS — N179 Acute kidney failure, unspecified: Secondary | ICD-10-CM

## 2024-09-17 LAB — BASIC METABOLIC PANEL WITH GFR
BUN: 22 mg/dL (ref 6–23)
CO2: 27 meq/L (ref 19–32)
Calcium: 9.4 mg/dL (ref 8.4–10.5)
Chloride: 106 meq/L (ref 96–112)
Creatinine, Ser: 1.2 mg/dL (ref 0.40–1.50)
GFR: 59.47 mL/min — ABNORMAL LOW (ref >60.00)
Glucose, Bld: 87 mg/dL (ref 70–99)
Potassium: 5 meq/L (ref 3.5–5.1)
Sodium: 138 meq/L (ref 135–145)

## 2024-09-17 MED ORDER — LORAZEPAM 1 MG PO TABS: 1.0000 mg | ORAL_TABLET | Freq: Two times a day (BID) | ORAL | 1 refills | Status: AC | PRN

## 2024-09-17 MED ORDER — ATORVASTATIN CALCIUM 40 MG PO TABS: 40.0000 mg | ORAL_TABLET | Freq: Every day | ORAL | 3 refills | Status: DC

## 2024-09-17 MED ORDER — TRAMADOL HCL 50 MG PO TABS: 50.0000 mg | ORAL_TABLET | Freq: Two times a day (BID) | ORAL | 1 refills | Status: AC | PRN

## 2024-09-17 MED ORDER — OMEGA-3-ACID ETHYL ESTERS 1 G PO CAPS: 2.0000 g | ORAL_CAPSULE | Freq: Two times a day (BID) | ORAL | 3 refills | Status: AC

## 2024-09-17 NOTE — Transitions of Care (Post Inpatient/ED Visit) (Signed)
° °  09/17/2024  Name: Henry Knight MRN: [id number] DOB: Nov 02, 1950  Today's TOC FU Call Status: Today's TOC FU Call Status:: Successful TOC FU Call Completed TOC FU Call Complete Date: 09/17/24  Attempted to reach the patient regarding the most recent Inpatient/ED visit. Spoje with patient to is headed to MD appointment.  Follow Up Plan: Additional outreach attempts will be made to reach the patient to complete the Transitions of Care (Post Inpatient/ED visit) call.   Alan Ee, RN, BSN, CEN [employer]- Transition of Care Team.  [facility] [phone]

## 2024-09-17 NOTE — Transitions of Care (Post Inpatient/ED Visit) (Signed)
° °  09/17/2024  Name: CORTLAND CREHAN MRN: [id number] DOB: 12-08-1950  Today's TOC FU Call Status: Today's TOC FU Call Status:: Unsuccessful Call (2nd Attempt) TOC FU Call Complete Date: 09/17/24  Attempted to reach the patient regarding the most recent Inpatient/ED visit.  Follow Up Plan: Additional outreach attempts will be made to reach the patient to complete the Transitions of Care (Post Inpatient/ED visit) call.   Alan Ee, RN, BSN, CEN [employer]- Transition of Care Team.  [facility] [phone]

## 2024-09-17 NOTE — Progress Notes (Unsigned)
 Henry Knight , [birth year], 73 y.o., male MRN: [id number] Patient Care Team    Relationship Specialty Notifications Start End  Catherine Charlies LABOR, DO PCP - General Family Medicine  07/11/15   Kate Lonni LITTIE, MD PCP - Cardiology Cardiology  09/12/24   Rollin Dover, MD Consulting Physician Gastroenterology  08/11/16   Melodi Lerner, MD Consulting Physician Orthopedic Surgery  08/11/16   Carlie Clark, MD Consulting Physician Otolaryngology  08/11/16   Sciences, [facility] (Inactive)    10/13/16    Comment: plastic and reconstructiion [facility] (on [facility] in [place])  Livingston Rigg, MD Consulting Physician Dermatology  06/30/17     Chief Complaint  Patient presents with   Hospitalization Follow-up     Subjective:  Henry Knight  is a 73 y.o. male presents for hospital follow up after recent admission on 09/11/2024 for primary diagnosis acute on chronic systolic heart failure.  Patient was discharged on 09/14/2024 to home. Patients discharge summary has been reviewed, as well as all labs/image studies obtained during hospitalization.  Medication reconciliation completed today.  Patients hospital course: Patient reported to the emergency room with shortness of breath.  He was found to be in atrial fibrillation.  He has a history of pulmonary hypertension.  Chest x-ray positive for cardiomegaly and bilateral hilar vascular congestion with bilateral central interstitial infiltrates.  Small pleural effusion. Patient was found to be on acute on chronic CHF with reduced ejection fraction.  Patient was placed on amiodarone  and furosemide . Systolic ejection motion 25-30% with global hypokinesis and moderate concentric LVH, RV systolic function with mild reduction, RA with moderate to dilatation, no significant fibrillar disease.  Ascending thoracic abdominal aneurysm at 4.5 cm is noted.  Patent foramen ovale noted.  Patient was diuresed with 5 kg during  hospitalization. He was discharged on spironolactone , Entresto , SGL 2 inhibitor, metoprolol  and loop diuretic and apixaban . He did eventually achieve a successful cardioversion.  Discharged home on sinus rhythm. Since hospital discharge patient reports overall he is doing well.  Adjusting to taking all the medications.  He reports a few of the medications are very expensive if he is going to need it long-term.  He is planning on discussing medications with the pharmacy assistance program with his cardiology team.  No results for input(s): HGB, HCT, WBC, PLT in the last 168 hours.     Latest Ref Rng & Units 09/17/2024   12:06 PM 09/14/2024    3:12 AM 09/13/2024    4:51 AM  CMP  Glucose 70 - 99 mg/dL 87  886  892   BUN 6 - 23 mg/dL 22  28  23    Creatinine 0.40 - 1.50 mg/dL 8.79  8.65  8.86   Sodium 135 - 145 mEq/L 138  138  137   Potassium 3.5 - 5.1 mEq/L 5.0  3.8  3.6   Chloride 96 - 112 mEq/L 106  105  104   CO2 19 - 32 mEq/L 27  21  23    Calcium  8.4 - 10.5 mg/dL 9.4  8.7  8.6      VAS US  LOWER EXTREMITY VENOUS (DVT) Result Date: 09/12/2024  Summary: RIGHT: - There is no evidence of deep vein thrombosis in the lower extremity.  - No cystic structure found in the popliteal fossa.  LEFT: - There is no evidence of deep vein thrombosis in the lower extremity.  - No cystic structure found in the popliteal fossa.  *See table(s) above  for measurements and observations. Electronically signed by Debby Robertson on 09/12/2024 at 10:14:07 PM.    Final    ECHO TEE Result Date: 09/12/2024  CHF, Arrythmias:Atrial Fibrillation; Risk Factors:Hypertension.    IMPRESSIONS  1. Left ventricular ejection fraction, by estimation, is 25 to 30%. Left ventricular ejection fraction by 2D MOD biplane is 30.2 %. The left ventricle has severely decreased function. The left ventricle demonstrates global hypokinesis. There is moderate  concentric left ventricular hypertrophy.  2. Right ventricular systolic  function is mildly reduced. The right ventricular size is normal.  3. Left atrial size was moderately dilated. No left atrial/left atrial appendage thrombus was detected.  4. Right atrial size was moderately dilated.  5. The mitral valve is grossly normal. Mild mitral valve regurgitation.  6. The aortic valve is tricuspid. Aortic valve regurgitation is mild.  7. The inferior vena cava is dilated in size with <50% respiratory variability, suggesting right atrial pressure of 15 mmHg.  8. Evidence of atrial level shunting detected by color flow Doppler. Agitated saline contrast bubble study was negative, with no evidence of any interatrial shunt. There is a small patent foramen ovale with predominantly left to right shunting across the atrial septum.  9. Unable to cardiovert the patient. 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 D/w HF and primary cardiology.    CT Angio Chest Pulmonary Embolism (PE) W or WO Contrast Addendum Date: 09/11/2024 ADDENDUM REPORT: 09/11/2024 09:41 ADDENDUM: As noted in the body of the report, the ascending thoracic aorta measures 4.5 cm diameter. Ascending thoracic aortic aneurysm. Recommend semi-annual imaging followup by CTA or MRA and referral to cardiothoracic surgery if not already obtained. This recommendation follows 2010 ACCF/AHA/AATS/ACR/ASA/SCA/SCAI/SIR/STS/SVM Guidelines for the Diagnosis and Management of Patients With Thoracic Aortic Disease. Circulation. 2010; 121: Z733-z630. Aortic aneurysm NOS (ICD10-I71.   Result Date: 09/11/2024 IMPRESSION: 1. No CT evidence for acute pulmonary embolus. 2. Cardiomegaly with vascular congestion and diffuse interlobular septal thickening suggesting edema. 3. Small to moderate right and small left pleural effusions with collapse/consolidation in the lung bases bilaterally. 4. Enlargement of the pulmonary outflow tract/main  pulmonary arteries suggests pulmonary arterial hypertension. 5. Tiny nonobstructing stone interpolar left kidney. Electronically Signed: By: Camellia Candle M.D. On: 09/11/2024 09:35   DG Chest 2 View Result Date: 09/11/2024 IMPRESSION: 1. Hazy and reticular opacities bilaterally, possibly representing edema or diffuse pneumonitis. 2. Mild peribronchial cuffing.      09/05/2024    9:20 AM 08/01/2024    2:37 PM 08/01/2024    2:23 PM 10/21/2023    9:01 AM 02/02/2023    8:08 AM  Depression screen PHQ 2/9  Decreased Interest 0 0 0 0 0  Down, Depressed, Hopeless 0 0 0 0 0  PHQ - 2 Score 0 0 0 0 0  Altered sleeping 1   0   Tired, decreased energy 1   0   Change in appetite 0   0   Feeling bad or failure about yourself  0   0   Trouble concentrating 0   0   Moving slowly or fidgety/restless 0   0   Suicidal thoughts 0   0   PHQ-9 Score 2   0    Difficult doing work/chores Not difficult at all   Not difficult at all      Data saved with a previous flowsheet row definition    Allergies[1] Social History   Tobacco Use   Smoking  status: Never   Smokeless tobacco: Never  Substance Use Topics   Alcohol use: Yes    Alcohol/week: 8.0 standard drinks of alcohol    Types: 8 Glasses of wine per week    Comment: Weekend   Past Medical History:  Diagnosis Date   Arthritis    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 Rollin)   HTN (hypertension)    Melanoma (HCC) [id number]   MM 2 CHEST TX EXC   Neuromuscular disorder (HCC)    OA (osteoarthritis) of knee 02/20/2019   SCC (squamous cell carcinoma) 05/15/2007   SCCA RIGHT FOREARM TX WITH BX   SCC (squamous cell carcinoma) 07/04/2013   WELL DIFF SCC LEFT FOREARM CX3 5FU   SCC (squamous cell carcinoma) 06/26/2014   SCC IN SITU TOP OF  SCALP TX CX3 5FU   SCC (squamous cell carcinoma) 07/27/2017   SCC IN SITU RIGHT SHOULDER TX CX3 5FU   Squamous cell carcinoma of skin 11/10/2004   SCC IN SITU RIGHT FOREARM CX3 5FU   Past Surgical History:  Procedure Laterality Date   CARDIOVERSION N/A 09/13/2024   Procedure: CARDIOVERSION;  Surgeon: Zenaida Morene PARAS, MD;  Location: [facility];  Service: Cardiovascular;  Laterality: N/A;   COLONOSCOPY W/ POLYPECTOMY  approx 2007; 11/2014   Dr. 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   Procedure: LEFT TOTAL KNEE ARTHROPLASTY;  Surgeon: Melodi Lerner, MD;  Location: [facility];  Service: Orthopedics;  Laterality: Left;  50 mins   TRANSESOPHAGEAL ECHOCARDIOGRAM (CATH LAB) N/A 09/12/2024   Procedure: TRANSESOPHAGEAL ECHOCARDIOGRAM;  Surgeon: Mona Vinie BROCKS, MD;  Location: [facility];  Service: Cardiovascular;  Laterality: N/A;   Family History  Problem Relation Age of Onset   Heart disease Father    Alcohol abuse Father    Obesity Brother    Asperger's syndrome Brother    Obesity Mother    Tremor Mother    Anxiety disorder Mother    Allergies as of 09/17/2024       Reactions   Penicillins Rash, Other (See Comments)   Has patient had a PCN reaction causing immediate rash, facial/tongue/throat swelling, SOB or lightheadedness with hypotension: Unknown Has patient had a PCN reaction causing severe rash involving mucus membranes or skin necrosis: No Has patient had a PCN reaction that required hospitalization: No Has patient had a PCN reaction occurring within the last 10 years: No If all of the above answers are NO, then may proceed with Cephalosporin use.        Medication List        Accurate as of September 17, 2024 11:59 PM. If you have any questions,  ask your nurse or doctor.          amiodarone  200 MG tablet Commonly known as: PACERONE  Take 2 tablets daily for one week, and then on 09/22/24 start taking one tablet daily.   atorvastatin  40 MG tablet Commonly known as: LIPITOR Take 1 tablet (40 mg total) by mouth daily.   Eliquis   5 MG Tabs tablet Generic drug: apixaban  Take 1 tablet (5 mg total) by mouth 2 (two) times daily.   furosemide  40 MG tablet Commonly known as: LASIX  Take 1 tablet (40 mg total) by mouth daily as needed for edema or fluid (in case of weight gain 2 to 3 lbs in 24 hrs or 5 lbs in 7 days).   Jardiance  10 MG Tabs tablet Generic drug: empagliflozin  Take 1 tablet (10 mg total) by mouth daily.   LORazepam  1 MG tablet Commonly known as: ATIVAN  Take 1 tablet (1 mg total) by mouth 2 (two) times daily as needed for anxiety.   metoprolol  succinate 25 MG 24 hr tablet Commonly known as: TOPROL -XL Take 1 tablet (25 mg total) by mouth daily.   omega-3 acid ethyl esters 1 g capsule Commonly known as: LOVAZA  Take 2 capsules (2 g total) by mouth 2 (two) times daily.   potassium chloride  SA 20 MEQ tablet Commonly known as: KLOR-CON  M Take 2 tablets (40 mEq total) by mouth daily as needed (take only when taking furosemide ).   sacubitril -valsartan  24-26 MG Commonly known as: ENTRESTO  Take 1 tablet by mouth 2 (two) times daily.   spironolactone  25 MG tablet Commonly known as: ALDACTONE  Take 1 tablet (25 mg total) by mouth daily.   traMADol  50 MG tablet Commonly known as: ULTRAM  Take 1-2 tablets (50-100 mg total) by mouth every 12 (twelve) hours as needed (mild pain).   TYLENOL  PO Take 2-4 tablets by mouth as needed.        All past medical history, surgical history, allergies, family history, immunizations and medications were updated in the EMR today and reviewed under the history and medication portions of their EMR.      ROS: Negative, with the exception of above mentioned in HPI   Objective:   BP 124/82   Pulse (!) 57   Temp 97.9 F (36.6 C)   Wt 228 lb 9.6 oz (103.7 kg)   SpO2 96%   BMI 31.00 kg/m  Body mass index is 31 kg/m. Physical Exam Vitals and nursing note reviewed. Exam conducted with a chaperone present.  Constitutional:      General: He is not in acute distress.    Appearance: Normal appearance. He is not ill-appearing, toxic-appearing or diaphoretic.  HENT:     Head: Normocephalic and atraumatic.  Eyes:     General: No scleral icterus.       Right eye: No discharge.        Left eye: No discharge.     Extraocular Movements: Extraocular movements intact.     Pupils: Pupils are equal, round, and reactive to light.  Cardiovascular:     Rate and Rhythm: Normal rate and regular rhythm.     Heart sounds: No murmur heard. Pulmonary:     Effort: Pulmonary effort is normal. No respiratory distress.     Breath sounds: Normal breath sounds. No wheezing, rhonchi or rales.  Musculoskeletal:     Right lower leg: No edema.     Left lower leg: No edema.  Skin:    General: Skin is warm.     Findings: No rash.  Neurological:     Mental Status: He is alert and oriented to person, place, and time. Mental status is at baseline.  Psychiatric:        Mood and Affect: Mood normal.        Behavior: Behavior normal.        Thought Content: Thought content normal.  Judgment: Judgment normal.    Assessment/Plan: Henry Knight is a 73 y.o. male present for OV for Hospital discharge follow up HFrEF/atrial fibrillation with RVR (HCC) (Primary)/Patent foramen ovale/atrial flutter/dyslipidemia Patient is appropriate following up with cardiology Medication reconciliation completed today and patient has successfully started medications by cardiology. Entresto  24-26 mg twice daily Spironolactone  25 mg daily Metoprolol  succinate 25 mg daily Jardiance  10 mg daily Furosemide  40 mg as needed in the event of volume overload-patient is checking his dry weights daily  and understands to take furosemide  if weight increases 2 to 3 pounds in 24 hours or 5 pounds in 7 days. Amiodarone  400 mg daily until 12/20 he will start taking 200 mg daily.  Patient understands these instructions and was able to repeat back instructions. Apixaban  5 mg twice daily Continue Lipitor 40 mg daily Continue Lovaza  2 g twice daily - Basic Metabolic Panel (BMET) - TSH; Future  AKI (acute kidney injury) - Basic Metabolic Panel (BMET)  Abnormal TSH - TSH; Future> will need to collect TSH lab appointment in about 4 weeks  Aneurysm of ascending aorta without rupture 4.5 cm Semiannual CTA or MRA recommended.  Patient is establishing with cardiology. May need referral to cardiothoracic surgery.  Will defer to cardiology team.      Reviewed expectations re: course of current medical issues. Discussed self-management of symptoms. Outlined signs and symptoms indicating need for more acute intervention. Patient verbalized understanding and all questions were answered. Patient received an After-Visit Summary. Any changes in medications were reviewed and patient was provided with updated med list with their AVS.     Orders Placed This Encounter  Procedures   Basic Metabolic Panel (BMET)   TSH     Note is dictated utilizing voice recognition software. Although note has been proof read prior to signing, occasional typographical errors still can be missed. If any questions arise, please do not hesitate to call for verification.   electronically signed by:  Charlies Bellini, DO  [facility] - [place]       [1]  Allergies Allergen Reactions   Penicillins Rash and Other (See Comments)    Has patient had a PCN reaction causing immediate rash, facial/tongue/throat swelling, SOB or lightheadedness with hypotension: Unknown Has patient had a PCN reaction causing severe rash involving mucus membranes or skin necrosis: No Has patient had a PCN reaction that required  hospitalization: No Has patient had a PCN reaction occurring within the last 10 years: No If all of the above answers are NO, then may proceed with Cephalosporin use.

## 2024-09-17 NOTE — Patient Instructions (Addendum)
 Return in about 28 weeks (around 04/01/2025) for cpe (20 min), Routine chronic condition follow-up.   Lab appt in 4 weeks to recheck thyroid  levels.      Great to see you today.  I have refilled the medication(s) we provide.   If labs were collected or images ordered, we will inform you of  results once we have received them and reviewed. We will contact you either by echart message, or telephone call.  Please give ample time to the testing facility, and our office to run,  receive and review results. Please do not call inquiring of results, even if you can see them in your chart. We will contact you as soon as we are able. If it has been over 1 week since the test was completed, and you have not yet heard from us , then please call us .    - echart message- for normal results that have been seen by the patient already.   - telephone call: abnormal results or if patient has not viewed results in their echart.  If a referral to a specialist was entered for you, please call us  in 2 weeks if you have not heard from the specialist office to schedule.

## 2024-09-18 ENCOUNTER — Telehealth: Payer: Self-pay

## 2024-09-18 NOTE — Transitions of Care (Post Inpatient/ED Visit) (Signed)
° °  09/18/2024  Name: Henry Knight MRN: [id number] DOB: 1951/09/21  Today's TOC FU Call Status: Today's TOC FU Call Status:: Unsuccessful Call (3rd Attempt) Unsuccessful Call (3rd Attempt) Date: 09/18/24  Attempted to reach the patient regarding the most recent Inpatient/ED visit.  Follow Up Plan: No further outreach attempts will be made at this time. We have been unable to contact the patient.  Alan Ee, RN, BSN, CEN [employer]- Transition of Care Team.  [facility] [phone]

## 2024-09-19 DIAGNOSIS — I5020 Unspecified systolic (congestive) heart failure: Secondary | ICD-10-CM | POA: Insufficient documentation

## 2024-09-19 DIAGNOSIS — R7989 Other specified abnormal findings of blood chemistry: Secondary | ICD-10-CM | POA: Insufficient documentation

## 2024-09-19 DIAGNOSIS — I7121 Aneurysm of the ascending aorta, without rupture: Secondary | ICD-10-CM | POA: Insufficient documentation

## 2024-09-19 DIAGNOSIS — Q2112 Patent foramen ovale: Secondary | ICD-10-CM | POA: Insufficient documentation

## 2024-09-20 ENCOUNTER — Other Ambulatory Visit (HOSPITAL_COMMUNITY): Payer: Self-pay

## 2024-09-28 ENCOUNTER — Ambulatory Visit (HOSPITAL_COMMUNITY): Payer: Self-pay | Admitting: Adult Health

## 2024-09-28 ENCOUNTER — Ambulatory Visit (HOSPITAL_COMMUNITY)
Admit: 2024-09-28 | Discharge: 2024-09-28 | Disposition: A | Discharge status: Home / Self Care | Attending: Adult Health | Admitting: Adult Health

## 2024-09-28 VITALS — BP 118/70 | HR 48 | Ht 72.0 in | Wt 225.0 lb

## 2024-09-28 DIAGNOSIS — G250 Essential tremor: Secondary | ICD-10-CM | POA: Diagnosis not present

## 2024-09-28 DIAGNOSIS — I110 Hypertensive heart disease with heart failure: Secondary | ICD-10-CM | POA: Diagnosis not present

## 2024-09-28 DIAGNOSIS — I453 Trifascicular block: Secondary | ICD-10-CM | POA: Diagnosis not present

## 2024-09-28 DIAGNOSIS — Q2112 Patent foramen ovale: Secondary | ICD-10-CM | POA: Insufficient documentation

## 2024-09-28 DIAGNOSIS — I5020 Unspecified systolic (congestive) heart failure: Secondary | ICD-10-CM | POA: Diagnosis not present

## 2024-09-28 DIAGNOSIS — I4892 Unspecified atrial flutter: Secondary | ICD-10-CM | POA: Insufficient documentation

## 2024-09-28 DIAGNOSIS — Z79899 Other long term (current) drug therapy: Secondary | ICD-10-CM | POA: Diagnosis not present

## 2024-09-28 DIAGNOSIS — I440 Atrioventricular block, first degree: Secondary | ICD-10-CM | POA: Insufficient documentation

## 2024-09-28 DIAGNOSIS — R001 Bradycardia, unspecified: Secondary | ICD-10-CM | POA: Insufficient documentation

## 2024-09-28 DIAGNOSIS — E785 Hyperlipidemia, unspecified: Secondary | ICD-10-CM | POA: Diagnosis not present

## 2024-09-28 DIAGNOSIS — I5022 Chronic systolic (congestive) heart failure: Secondary | ICD-10-CM | POA: Diagnosis present

## 2024-09-28 DIAGNOSIS — Z8249 Family history of ischemic heart disease and other diseases of the circulatory system: Secondary | ICD-10-CM | POA: Diagnosis not present

## 2024-09-28 DIAGNOSIS — Z7901 Long term (current) use of anticoagulants: Secondary | ICD-10-CM | POA: Diagnosis not present

## 2024-09-28 DIAGNOSIS — I480 Paroxysmal atrial fibrillation: Secondary | ICD-10-CM | POA: Diagnosis not present

## 2024-09-28 LAB — BASIC METABOLIC PANEL WITH GFR
Anion gap: 10 (ref 5–15)
BUN: 22 mg/dL (ref 8–23)
CO2: 21 mmol/L — ABNORMAL LOW (ref 22–32)
Calcium: 9.3 mg/dL (ref 8.9–10.3)
Chloride: 104 mmol/L (ref 98–111)
Creatinine, Ser: 1.21 mg/dL (ref 0.61–1.24)
GFR, Estimated: >60 mL/min
Glucose, Bld: 105 mg/dL — ABNORMAL HIGH (ref 70–99)
Potassium: 4.6 mmol/L (ref 3.5–5.1)
Sodium: 135 mmol/L (ref 135–145)

## 2024-09-28 MED ORDER — METOPROLOL SUCCINATE ER 25 MG PO TB24: 25.0000 mg | ORAL_TABLET | Freq: Every day | ORAL | Status: DC

## 2024-09-28 NOTE — Patient Instructions (Signed)
 Medication Changes:  STOP Amiodarone   HOLD Metoprolol  Saturday 12/27, RESTART on Sunday 12/28 at 25 mg Daily at BEDTIME  Lab Work:  Labs done today, your results will be available in MyChart, we will contact you for abnormal readings.   Special Instructions // Education:  Do the following things EVERYDAY: Weigh yourself in the morning before breakfast. Write it down and keep it in a log. Take your medicines as prescribed Eat low salt foods--Limit salt (sodium) to 2000 mg per day.  Stay as active as you can everyday Limit all fluids for the day to less than 2 liters   Follow-Up in:   1 week for EKG only 2-3 weeks with APP (Advanced Practice Provider) 6-8 weeks with Dr Rolan   At the [facility], you and your health needs are our priority. We have a designated team specialized in the treatment of Heart Failure. This Care Team includes your primary Heart Failure Specialized Cardiologist (physician), Advanced Practice Providers (APPs- Physician Assistants and Nurse Practitioners), and Pharmacist who all work together to provide you with the care you need, when you need it.   You may see any of the following providers on your designated Care Team at your next follow up:  Dr. Toribio Fuel Dr. Ezra Rolan Dr. Odis Brownie Greig Mosses, NP Caffie Shed, [place], [place] Beckey Coe, NP Jordan Lee, NP Tinnie Redman, PharmD   Please be sure to bring in all your medications bottles to every appointment.   Need to Contact Us :  If you have any questions or concerns before your next appointment please send us  a message through [facility] or call our office at [phone].    TO LEAVE A MESSAGE FOR THE NURSE SELECT OPTION 2, PLEASE LEAVE A MESSAGE INCLUDING: YOUR NAME DATE OF BIRTH CALL BACK NUMBER REASON FOR CALL**this is important as we prioritize the call backs  YOU WILL RECEIVE A CALL BACK THE SAME DAY AS LONG AS YOU CALL BEFORE  4:00 PM

## 2024-09-28 NOTE — Progress Notes (Signed)
 "  ADVANCED HF CLINIC NOTE  Referring Physician: Catherine Charlies LABOR, DO Primary Care: Catherine Charlies LABOR, DO Primary Cardiologist: Lonni LITTIE Nanas, MD HF MD: Dr Rolan   Chief Complaint: Heart Failure   HPI: 73 y.o. male with PAF, PFO,  HLD, essential tremor, and chronic HFrEF.    Diagnosed  with bronchitis 09/05/24  by PCP and treated with doxycycline .    He presented to ED 09/11/24 with 2 weeks of progressive shortness of breath. In ED, BP 179/135, T 97.6F, HR 118. ECG showed AFL 123 bpm. CXR showed cardiomegaly and pulmonary edema. CTA negative for PE, but showed vascular congestion, small to moderate bilateral pleural effusion, enlarged main PE, incidental nonobstructive L renal stone. He was started on amio gtt and heparin  gtt and given a dose of IV Lasix . He underwent TEE only 09/12/24. TEE performed by Dr. Mona showed EF ~20%, smoke in the aorta suggestive of low output, No LAA thrombus.  Due to evidence for significant volume overload, DCCV deferred until he is effectively diuresed. DCCV done prior to discharge with restoration of SR. At d/c started on 4 pillars of HF. Discharged 09/14/24.   Today he returns for HF follow up. Feels the best he has felt in a while. Does says his energy is a little low.  Denies SOB/PND/Orthopnea. Able to walk 2-3 miles without difficulty. No bleeding issues. Appetite ok. No fever or chills. Taking all medications. He has not needed any lasix . Drinks 1-2 bottles of wine on the weekend.     Past Medical History:  Diagnosis Date   Arthritis    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 Rollin)   HTN (hypertension)    Melanoma (HCC) [id number]   MM 2 CHEST TX EXC   Neuromuscular disorder (HCC)    OA (osteoarthritis) of knee 02/20/2019   SCC  (squamous cell carcinoma) 05/15/2007   SCCA RIGHT FOREARM TX WITH BX   SCC (squamous cell carcinoma) 07/04/2013   WELL DIFF SCC LEFT FOREARM CX3 5FU   SCC (squamous cell carcinoma) 06/26/2014   SCC IN SITU TOP OF SCALP TX CX3 5FU   SCC (squamous cell carcinoma) 07/27/2017   SCC IN SITU RIGHT SHOULDER TX CX3 5FU   Squamous cell carcinoma of skin 11/10/2004   SCC IN SITU RIGHT FOREARM CX3 5FU    Current Outpatient Medications  Medication Sig Dispense Refill   Acetaminophen  (TYLENOL  PO) Take 2-4 tablets by mouth as needed.     amiodarone  (PACERONE ) 200 MG tablet Take 2 tablets daily for one week, and then on 09/22/24 start taking one tablet daily. 37 tablet 0   apixaban  (ELIQUIS ) 5 MG TABS tablet Take 1 tablet (5 mg total) by mouth 2 (two) times daily. 60 tablet 0   atorvastatin  (LIPITOR) 40 MG tablet Take 1 tablet (40 mg total) by mouth daily. 90 tablet 3   empagliflozin  (JARDIANCE ) 10 MG TABS tablet Take 1 tablet (10 mg total) by mouth daily. 30 tablet 0   furosemide  (LASIX ) 40 MG tablet Take 1 tablet (40 mg total) by mouth daily as needed for edema or fluid (in case of weight gain 2 to 3 lbs in 24 hrs or 5 lbs in 7 days). 30 tablet 0   LORazepam  (ATIVAN ) 1  MG tablet Take 1 tablet (1 mg total) by mouth 2 (two) times daily as needed for anxiety. 180 tablet 1   metoprolol  succinate (TOPROL -XL) 25 MG 24 hr tablet Take 1 tablet (25 mg total) by mouth daily. 30 tablet 0   omega-3 acid ethyl esters (LOVAZA ) 1 g capsule Take 2 capsules (2 g total) by mouth 2 (two) times daily. 360 capsule 3   potassium chloride  SA (KLOR-CON  M) 20 MEQ tablet Take 2 tablets (40 mEq total) by mouth daily as needed (take only when taking furosemide ). 30 tablet 0   sacubitril -valsartan  (ENTRESTO ) 24-26 MG Take 1 tablet by mouth 2 (two) times daily. 60 tablet 0   spironolactone  (ALDACTONE ) 25 MG tablet Take 1 tablet (25 mg total) by mouth daily. 30 tablet 0   traMADol  (ULTRAM ) 50 MG tablet Take 1-2 tablets (50-100 mg  total) by mouth every 12 (twelve) hours as needed (mild pain). 180 tablet 1   No current facility-administered medications for this encounter.    Allergies[1]    Social History   Socioeconomic History   Marital status: Married    Spouse name: Not on file   Number of children: 1   Years of education: Not on file   Highest education level: Not on file  Occupational History   Not on file  Tobacco Use   Smoking status: Never   Smokeless tobacco: Never  Vaping Use   Vaping status: Never Used  Substance and Sexual Activity   Alcohol use: Yes    Alcohol/week: 8.0 standard drinks of alcohol    Types: 8 Glasses of wine per week    Comment: Weekend   Drug use: No   Sexual activity: Yes    Partners: Female  Other Topics Concern   Not on file  Social History Narrative   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 He lives with his wife & daughter.   Social Drivers of Health   Tobacco Use: Low Risk (09/17/2024)   Patient History    Smoking Tobacco Use: Never    Smokeless Tobacco Use: Never    Passive Exposure: Not on file  Financial Resource Strain: Low Risk (08/01/2024)   Overall Financial Resource Strain (CARDIA)    Difficulty of Paying Living Expenses: Not hard at all  Food Insecurity: No Food Insecurity (09/11/2024)   Epic    Worried About Programme Researcher, Broadcasting/film/video in the Last Year: Never true    Ran Out of Food in the Last Year: Never true  Transportation Needs: No Transportation Needs (09/11/2024)   Epic    Lack of Transportation (Medical): No    Lack of Transportation (Non-Medical): No  Physical Activity: Sufficiently Active (08/01/2024)   Exercise Vital Sign    Days of Exercise per Week: 5 days    Minutes of Exercise per Session: 90 min  Stress: No Stress Concern Present (08/01/2024)   [employer] of Occupational Health - Occupational Stress Questionnaire    Feeling of Stress: Not at all  Social Connections: Moderately Integrated (09/11/2024)   Social Connection and  Isolation Panel    Frequency of Communication with Friends and Family: Twice a week    Frequency of Social Gatherings with Friends and Family: More than three times a week    Attends Religious Services: More than 4 times per year    Active Member of [employer] or Organizations: No    Attends Banker Meetings: Never    Marital Status: Married  Catering Manager Violence: Not At  Risk (09/11/2024)   Epic    Fear of Current or Ex-Partner: No    Emotionally Abused: No    Physically Abused: No    Sexually Abused: No  Depression (PHQ2-9): Low Risk (09/05/2024)   Depression (PHQ2-9)    PHQ-2 Score: 2  Alcohol Screen: Low Risk (08/01/2024)   Alcohol Screen    Last Alcohol Screening Score (AUDIT): 1  Housing: Low Risk (09/11/2024)   Epic    Unable to Pay for Housing in the Last Year: No    Number of Times Moved in the Last Year: 0    Homeless in the Last Year: No  Utilities: Not At Risk (09/11/2024)   Epic    Threatened with loss of utilities: No  Health Literacy: Adequate Health Literacy (08/01/2024)   B1300 Health Literacy    Frequency of need for help with medical instructions: Never      Family History  Problem Relation Age of Onset   Heart disease Father    Alcohol abuse Father    Obesity Brother    Asperger's syndrome Brother    Obesity Mother    Tremor Mother    Anxiety disorder Mother     Vitals:   09/28/24 0903  BP: 118/70  Pulse: (!) 48  Weight: 102.1 kg (225 lb)  Height: 6' (1.829 m)   Wt Readings from Last 3 Encounters:  09/28/24 102.1 kg (225 lb)  09/17/24 103.7 kg (228 lb 9.6 oz)  09/14/24 101.2 kg (223 lb 3.2 oz)    PHYSICAL EXAM: General:   No resp difficulty. Slight tremor in his hands.  Neck: no JVD.  Cor: Regular rate & rhythm.  Lungs: clear Abdomen: soft, nontender, nondistended.  Extremities: no  edema Neuro: alert & oriented x3  ECG: SInus Brady 44 bpm personally reviewed.    ASSESSMENT & PLAN: 1. Chronic HFrEF TEE 12/10 showed EF  25-30% mod LVH, mildly RV dysfunction, mild AI, IVC dilated. No prior cardiac history. Father died of cardiac disease. 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  Plan to repeat Echo in 3 months after HF meds optimized. We discussed plan over the next 3 months,.  NYHA I-II Diuretics - Appears euvolemic. Continue lasix  as needed.  GDMT  BB- Holding Toprol  XL with bradycardia today then start back Toprol  XL 25 mg at bedtime tomorrow.     Ace/ARB/ARNI- continue entresto  24-26 mg twice a day  MRA- Continue spiro 25 mg daily  SGLT2i- Continue jardiance  10 mg daily  Check BMET  Discussed limiting fluids to < 2 liters per day. Discussed limiting alcohol intake.   2. PAF 09/2024 S/P TEE/DC-CV with restoration of SR  EKG today - sinus brady 44  Stop amiodarone  .  Hold Toprol  Xl tomorrow. On Sunday he will start switch Toprol  XL to bedtime.  Continue eliquis  5 mg twice a day  3. Bradycardia Asymptomatic. EKG as above Sinus Nola. Stop amio as above.   Follow up next Friday with an EKG then back to the clinic in 2 weeks for HF med titrations. 6 weeks with Dr Rolan.     [facility]/N[facility]      [1]  Allergies Allergen Reactions   Penicillins Rash and Other (See Comments)    Has patient had a PCN reaction causing immediate rash, facial/tongue/throat swelling, SOB or lightheadedness with hypotension: Unknown Has patient had a PCN reaction causing severe rash involving mucus membranes or skin necrosis: No Has patient had a PCN reaction that required hospitalization: No Has patient  had a PCN reaction occurring within the last 10 years: No If all of the above answers are NO, then may proceed with Cephalosporin use.    "

## 2024-10-05 ENCOUNTER — Ambulatory Visit (HOSPITAL_COMMUNITY)
Admission: RE | Admit: 2024-10-05 | Discharge: 2024-10-05 | Disposition: A | Discharge status: Home / Self Care | Source: Ambulatory Visit | Attending: Cardiology | Admitting: Cardiology

## 2024-10-05 DIAGNOSIS — I5020 Unspecified systolic (congestive) heart failure: Secondary | ICD-10-CM | POA: Diagnosis present

## 2024-10-05 MED ORDER — METOPROLOL SUCCINATE ER 25 MG PO TB24: 12.5000 mg | ORAL_TABLET | Freq: Every day | ORAL | Status: DC

## 2024-10-05 NOTE — Patient Instructions (Addendum)
 Medication Changes:  DECREASE METOPROLOL  SUCCINATE TO 12.5MG  (1/2) TABLET ONCE DAILY AT BEDTIME   Follow-Up in: AS SCHEDULED ON 10/18/2024  At the [facility], you and your health needs are our priority. We have a designated team specialized in the treatment of Heart Failure. This Care Team includes your primary Heart Failure Specialized Cardiologist (physician), Advanced Practice Providers (APPs- Physician Assistants and Nurse Practitioners), and Pharmacist who all work together to provide you with the care you need, when you need it.   You may see any of the following providers on your designated Care Team at your next follow up:  Dr. Toribio Fuel Dr. Ezra Shuck Dr. Odis Brownie Greig Mosses, NP Caffie Shed, [place] [facility] [place], [place] Beckey Coe, NP Jordan Lee, NP Tinnie Redman, PharmD   Please be sure to bring in all your medications bottles to every appointment.   Need to Contact Us :  If you have any questions or concerns before your next appointment please send us  a message through [facility] or call our office at [phone].    TO LEAVE A MESSAGE FOR THE NURSE SELECT OPTION 2, PLEASE LEAVE A MESSAGE INCLUDING: YOUR NAME DATE OF BIRTH CALL BACK NUMBER REASON FOR CALL**this is important as we prioritize the call backs  YOU WILL RECEIVE A CALL BACK THE SAME DAY AS LONG AS YOU CALL BEFORE 4:00 PM

## 2024-10-05 NOTE — Progress Notes (Addendum)
 Patient came to office today for EKG/nurse visit. Patients EKG revealed sinus brady HR 45, reviewed by Harlene Gainer, NP.   Per Harlene, NP- decrease metoprolol  succinate to 12.5mg  daily at bedtime and follow up as scheduled on 10/18/24.   Patient is aware of all information and verbalized understanding.

## 2024-10-08 ENCOUNTER — Ambulatory Visit (HOSPITAL_COMMUNITY): Payer: Self-pay | Admitting: Family Medicine

## 2024-10-09 ENCOUNTER — Other Ambulatory Visit: Payer: Self-pay | Admitting: Family Medicine

## 2024-10-09 NOTE — Telephone Encounter (Unsigned)
 Copied from CRM [id number]. Topic: Clinical - Medication Refill >> Oct 09, 2024 11:36 AM Drema MATSU wrote: Medication: atorvastatin  (LIPITOR) 40 MG tablet sacubitril -valsartan  (ENTRESTO ) 24-26 MG [[id number]]  empagliflozin  (JARDIANCE ) 10 MG TABS tablet apixaban  (ELIQUIS ) 5 MG TABS tablet spironolactone  (ALDACTONE ) 25 MG tablet metoprolol  succinate (TOPROL -XL) 25 MG 24 hr tablet omega-3 acid ethyl esters (LOVAZA ) 1 g capsule  Has the patient contacted their pharmacy? Yes (Agent: If no, request that the patient contact the pharmacy for the refill. If patient does not wish to contact the pharmacy document the reason why and proceed with request.) call provider (Agent: If yes, when and what did the pharmacy advise?)  This is the patient's preferred pharmacy:  [id number] [address], [place] - [address] [place] [address] Phone: [phone] Fax: [phone]  Is this the correct pharmacy for this prescription? Yes If no, delete pharmacy and type the correct one.   Has the prescription been filled recently? No first time getting refill   Is the patient out of the medication? No  Has the patient been seen for an appointment in the last year OR does the patient have an upcoming appointment? Yes  Can we respond through MyChart? No  Agent: Please be advised that Rx refills may take up to 3 business days. We ask that you follow-up with your pharmacy.

## 2024-10-11 ENCOUNTER — Telehealth: Payer: Self-pay

## 2024-10-11 NOTE — Telephone Encounter (Signed)
Left pt a vm to return my call.

## 2024-10-11 NOTE — Telephone Encounter (Signed)
 Copied from CRM #[id number]. Topic: Clinical - Medication Question >> Oct 11, 2024 11:22 AM China J wrote: Reason for CRM: Patient said he had heart issues a month ago and is completley out of medication. He is wondering why it was denied as he was only given a 30-day supply and will be out of everything in about 2 days.  He was needing atorvastatin , metoprolol  succinate, spironolactone , sacubitril -valsartan , omega-3 acid ethyl esters, apixaban , and empagliflozin .  Patient would also like for someone to call him back at [phone] as he has some further questions about his medication and is needing help with figuring out possible alternatives for the medication he is taking due to insurance no longer covering his medications.  [id number] [address], [place] - [address] [place] [address] Phone: [phone] Fax: [phone]

## 2024-10-11 NOTE — Telephone Encounter (Signed)
 Pt is wanting to see if he could get a refill on Entresto , Spironolactone , jardiance  and Eliquis  which was prescibed by the ER Dr. He will be out of these medications on Saturday. He wants to know will you be refilling them in the future or will the cardiologist need to. Please advise

## 2024-10-12 ENCOUNTER — Other Ambulatory Visit (HOSPITAL_COMMUNITY): Payer: Self-pay | Admitting: Cardiology

## 2024-10-12 MED ORDER — FUROSEMIDE 40 MG PO TABS: 40.0000 mg | ORAL_TABLET | Freq: Every day | ORAL | 3 refills | Status: DC | PRN

## 2024-10-12 MED ORDER — SACUBITRIL-VALSARTAN 24-26 MG PO TABS: 1.0000 | ORAL_TABLET | Freq: Two times a day (BID) | ORAL | 3 refills | Status: DC

## 2024-10-12 MED ORDER — APIXABAN 5 MG PO TABS: 5.0000 mg | ORAL_TABLET | Freq: Two times a day (BID) | ORAL | 3 refills | Status: AC

## 2024-10-12 MED ORDER — POTASSIUM CHLORIDE CRYS ER 20 MEQ PO TBCR: 40.0000 meq | EXTENDED_RELEASE_TABLET | Freq: Every day | ORAL | 3 refills | Status: AC | PRN

## 2024-10-12 MED ORDER — METOPROLOL SUCCINATE ER 25 MG PO TB24: 12.5000 mg | ORAL_TABLET | Freq: Every day | ORAL | 3 refills | Status: DC

## 2024-10-12 MED ORDER — EMPAGLIFLOZIN 10 MG PO TABS: 10.0000 mg | ORAL_TABLET | Freq: Every day | ORAL | 3 refills | Status: DC

## 2024-10-12 MED ORDER — ATORVASTATIN CALCIUM 40 MG PO TABS: 40.0000 mg | ORAL_TABLET | Freq: Every day | ORAL | 3 refills | Status: DC

## 2024-10-12 MED ORDER — SPIRONOLACTONE 25 MG PO TABS: 25.0000 mg | ORAL_TABLET | Freq: Every day | ORAL | 3 refills | Status: DC

## 2024-10-15 NOTE — Telephone Encounter (Signed)
 LVM to discuss

## 2024-10-15 NOTE — Telephone Encounter (Signed)
 These requested medications are to be prescribed by his cardiology team.  I would recommend he call them and ask for refills and make sure he is getting into be seen with them per their protocol

## 2024-10-16 NOTE — Telephone Encounter (Signed)
 LVM to discuss, still unable to reach pt.

## 2024-10-17 ENCOUNTER — Telehealth (HOSPITAL_COMMUNITY): Payer: Self-pay

## 2024-10-17 NOTE — Telephone Encounter (Signed)
 Called to confirm/remind patient of their appointment at the [facility] on 10/18/24 11:00.   Appointment:   [] Confirmed  [x] Left mess   [] No answer/No voice mail  [] VM Full/unable to leave message  [] Phone not in service  Patient reminded to bring all medications and/or complete list.  Confirmed patient has transportation. Gave directions, instructed to utilize valet parking.

## 2024-10-17 NOTE — Progress Notes (Signed)
 "  ADVANCED HF CLINIC NOTE  Referring Physician: Catherine Charlies LABOR, DO Primary Care: Catherine Charlies LABOR, DO Primary Cardiologist: Lonni LITTIE Nanas, MD HF MD: Dr Rolan   Chief Complaint: Heart Failure   HPI: 74 y.o. male with PAF, PFO,  HLD, essential tremor, and chronic HFrEF.    Diagnosed  with bronchitis 09/05/24  by PCP and treated with doxycycline .    He presented to ED 09/11/24 with 2 weeks of progressive shortness of breath. In ED, BP 179/135, T 97.73F, HR 118. ECG showed AFL 123 bpm. CXR showed cardiomegaly and pulmonary edema. CTA negative for PE, but showed vascular congestion, small to moderate bilateral pleural effusion, enlarged main PE, incidental nonobstructive L renal stone. He was started on amio gtt and heparin  gtt and given a dose of IV Lasix . He underwent TEE only 09/12/24. TEE performed by Dr. Mona showed EF ~20%, smoke in the aorta suggestive of low output, No LAA thrombus.  Due to evidence for significant volume overload, DCCV deferred until he is effectively diuresed. DCCV done prior to discharge with restoration of SR. At d/c started on 4 pillars of HF. Discharged 09/14/24.   Today he returns for AHF follow up with his wife. Overall feeling good. Denies palpitations, CP, edema, or PND/Orthopnea. Occasional dizziness when he gets up too fast or with bending.  No SOB. Appetite ok, watches what he eats. No fever or chills. Weight at home 220 pounds. Taking all medications. Denies tobacco or drug use. Stays active by going on long walks, stationary bike and swimming. Drinks a couple glasses of wine on the weekends. Asking about traveling safety.   Past Medical History:  Diagnosis Date   Arthritis    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 Rollin)   HTN  (hypertension)    Melanoma (HCC) [id number]   MM 2 CHEST TX EXC   Neuromuscular disorder (HCC)    OA (osteoarthritis) of knee 02/20/2019   SCC (squamous cell carcinoma) 05/15/2007   SCCA RIGHT FOREARM TX WITH BX   SCC (squamous cell carcinoma) 07/04/2013   WELL DIFF SCC LEFT FOREARM CX3 5FU   SCC (squamous cell carcinoma) 06/26/2014   SCC IN SITU TOP OF SCALP TX CX3 5FU   SCC (squamous cell carcinoma) 07/27/2017   SCC IN SITU RIGHT SHOULDER TX CX3 5FU   Squamous cell carcinoma of skin 11/10/2004   SCC IN SITU RIGHT FOREARM CX3 5FU    Current Outpatient Medications  Medication Sig Dispense Refill   Acetaminophen  (TYLENOL  PO) Take 2-4 tablets by mouth as needed.     apixaban  (ELIQUIS ) 5 MG TABS tablet Take 1 tablet (5 mg total) by mouth 2 (two) times daily. 60 tablet 3   LORazepam  (ATIVAN ) 1 MG tablet Take 1 tablet (1 mg total) by mouth 2 (two) times daily as needed for anxiety. 180 tablet 1   omega-3 acid ethyl esters (LOVAZA ) 1 g capsule Take 2 capsules (2 g total) by mouth 2 (two) times daily. 360 capsule 3   potassium chloride  SA (KLOR-CON  M) 20 MEQ tablet Take 2 tablets (40 mEq total) by mouth daily as needed (take only when taking furosemide ). 60 tablet 3   traMADol  (ULTRAM ) 50 MG tablet Take 1-2 tablets (50-100 mg total) by mouth  every 12 (twelve) hours as needed (mild pain). 180 tablet 1   atorvastatin  (LIPITOR) 40 MG tablet Take 1 tablet (40 mg total) by mouth daily. 90 tablet 3   empagliflozin  (JARDIANCE ) 10 MG TABS tablet Take 1 tablet (10 mg total) by mouth daily. 90 tablet 3   furosemide  (LASIX ) 40 MG tablet Take 1 tablet (40 mg total) by mouth daily as needed for edema or fluid (in case of weight gain 2 to 3 lbs in 24 hrs or 5 lbs in 7 days). 30 tablet 3   metoprolol  succinate (TOPROL -XL) 25 MG 24 hr tablet Take 0.5 tablets (12.5 mg total) by mouth at bedtime. 45 tablet 3   sacubitril -valsartan  (ENTRESTO ) 24-26 MG Take 1 tablet by mouth 2 (two) times daily. 180 tablet 3    spironolactone  (ALDACTONE ) 25 MG tablet Take 1 tablet (25 mg total) by mouth daily. 90 tablet 3   No current facility-administered medications for this encounter.    Allergies[1]    Social History   Socioeconomic History   Marital status: Married    Spouse name: Not on file   Number of children: 1   Years of education: Not on file   Highest education level: Not on file  Occupational History   Not on file  Tobacco Use   Smoking status: Never   Smokeless tobacco: Never  Vaping Use   Vaping status: Never Used  Substance and Sexual Activity   Alcohol use: Yes    Alcohol/week: 8.0 standard drinks of alcohol    Types: 8 Glasses of wine per week    Comment: Weekend   Drug use: No   Sexual activity: Yes    Partners: Female  Other Topics Concern   Not on file  Social History Narrative   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 He lives with his wife & daughter.   Social Drivers of Health   Tobacco Use: Low Risk (10/18/2024)   Patient History    Smoking Tobacco Use: Never    Smokeless Tobacco Use: Never    Passive Exposure: Not on file  Financial Resource Strain: Low Risk (08/01/2024)   Overall Financial Resource Strain (CARDIA)    Difficulty of Paying Living Expenses: Not hard at all  Food Insecurity: No Food Insecurity (09/11/2024)   Epic    Worried About Programme Researcher, Broadcasting/film/video in the Last Year: Never true    Ran Out of Food in the Last Year: Never true  Transportation Needs: No Transportation Needs (09/11/2024)   Epic    Lack of Transportation (Medical): No    Lack of Transportation (Non-Medical): No  Physical Activity: Sufficiently Active (08/01/2024)   Exercise Vital Sign    Days of Exercise per Week: 5 days    Minutes of Exercise per Session: 90 min  Stress: No Stress Concern Present (08/01/2024)   [employer] of Occupational Health - Occupational Stress Questionnaire    Feeling of Stress: Not at all  Social Connections: Moderately Integrated (09/11/2024)    Social Connection and Isolation Panel    Frequency of Communication with Friends and Family: Twice a week    Frequency of Social Gatherings with Friends and Family: More than three times a week    Attends Religious Services: More than 4 times per year    Active Member of [employer] or Organizations: No    Attends Banker Meetings: Never    Marital Status: Married  Catering Manager Violence: Not At Risk (09/11/2024)   Epic  Fear of Current or Ex-Partner: No    Emotionally Abused: No    Physically Abused: No    Sexually Abused: No  Depression (PHQ2-9): Low Risk (09/05/2024)   Depression (PHQ2-9)    PHQ-2 Score: 2  Alcohol Screen: Low Risk (08/01/2024)   Alcohol Screen    Last Alcohol Screening Score (AUDIT): 1  Housing: Low Risk (09/11/2024)   Epic    Unable to Pay for Housing in the Last Year: No    Number of Times Moved in the Last Year: 0    Homeless in the Last Year: No  Utilities: Not At Risk (09/11/2024)   Epic    Threatened with loss of utilities: No  Health Literacy: Adequate Health Literacy (08/01/2024)   B1300 Health Literacy    Frequency of need for help with medical instructions: Never      Family History  Problem Relation Age of Onset   Heart disease Father    Alcohol abuse Father    Obesity Brother    Asperger's syndrome Brother    Obesity Mother    Tremor Mother    Anxiety disorder Mother     Vitals:   10/18/24 1100  BP: 120/62  Pulse: (!) 51  SpO2: 96%  Weight: 102 kg (224 lb 12.8 oz)  Height: 6' (1.829 m)    Wt Readings from Last 3 Encounters:  10/18/24 102 kg (224 lb 12.8 oz)  09/28/24 102.1 kg (225 lb)  09/17/24 103.7 kg (228 lb 9.6 oz)    PHYSICAL EXAM: General:  well appearing.  No respiratory difficulty Neck: JVD flat.  Cor: Regular rate & rhythm. No murmurs. Lungs: clear Extremities: no edema  Neuro: alert & oriented x 3. Affect pleasant.   ECG: Sinus Brady 49 bpm personally reviewed.    ASSESSMENT & PLAN: 1. Chronic  HFrEF TEE 12/10 showed EF 25-30% mod LVH, mildly RV dysfunction, mild AI, IVC dilated. No prior cardiac history. Father died of cardiac disease. 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  Plan to repeat Echo in 3 months after HF meds optimized.   NYHA I-II Diuretics - Appears euvolemic. Continue lasix  as needed.  GDMT  BB- Continue Toprol  XL 12.5 mg QHS Ace/ARB/ARNI- continue entresto  24-26 mg twice a day  MRA- Continue spiro 25 mg daily  SGLT2i- Continue jardiance  10 mg daily  Check BMET P-BNP Discussed limiting fluids to < 2 liters per day. Discussed limiting alcohol intake.  - If EF not improved in ~3 months would pursue further w/u with heart cath +/- cMRI.   2. PAF 09/2024 S/P TEE/DC-CV with restoration of SR  Now off amiodarone  with SB in 40s during last visit.  EKG with SB in the 50s Continue Toprol  XL 12.5 QHS Continue eliquis  5 mg twice a day. Denies abnormal bleeding.   3. Bradycardia Asymptomatic. EKG as above Sinus Nola.  - Off amio and BB cut back recently.  - Discussed with Dr. Zenaida, patient completely asymptomatic. Continue low dose Toprol  XL at night.  Ok to travel as long as he takes his medications with him. Will send in 90 day supplies for him.   Follow up as scheduled with Dr. Rolan.   Beckey LITTIE Coe AGACNP-BC  10/18/24  [facility] [place] [address] [facility] [place] [address] [phone] (office)     [1]  Allergies Allergen Reactions   Penicillins Rash and Other (See Comments)    Has patient had a PCN reaction causing immediate  rash, facial/tongue/throat swelling, SOB or lightheadedness with hypotension: Unknown Has patient had a PCN reaction causing severe rash involving mucus membranes or skin necrosis: No Has patient had a PCN reaction that required hospitalization: No Has patient had a PCN reaction occurring within the last 10 years: No If all of the above answers are NO,  then may proceed with Cephalosporin use.    "

## 2024-10-18 ENCOUNTER — Encounter (HOSPITAL_COMMUNITY): Payer: Self-pay

## 2024-10-18 ENCOUNTER — Ambulatory Visit (HOSPITAL_COMMUNITY): Payer: Self-pay | Admitting: Internal Medicine

## 2024-10-18 ENCOUNTER — Ambulatory Visit (HOSPITAL_COMMUNITY)
Admission: RE | Admit: 2024-10-18 | Discharge: 2024-10-18 | Disposition: A | Discharge status: Home / Self Care | Source: Ambulatory Visit | Attending: Internal Medicine | Admitting: Internal Medicine

## 2024-10-18 VITALS — BP 120/62 | HR 51 | Ht 72.0 in | Wt 224.8 lb

## 2024-10-18 DIAGNOSIS — E785 Hyperlipidemia, unspecified: Secondary | ICD-10-CM | POA: Diagnosis not present

## 2024-10-18 DIAGNOSIS — Z7984 Long term (current) use of oral hypoglycemic drugs: Secondary | ICD-10-CM | POA: Diagnosis not present

## 2024-10-18 DIAGNOSIS — I480 Paroxysmal atrial fibrillation: Secondary | ICD-10-CM | POA: Insufficient documentation

## 2024-10-18 DIAGNOSIS — Z8249 Family history of ischemic heart disease and other diseases of the circulatory system: Secondary | ICD-10-CM | POA: Insufficient documentation

## 2024-10-18 DIAGNOSIS — Z7901 Long term (current) use of anticoagulants: Secondary | ICD-10-CM | POA: Diagnosis not present

## 2024-10-18 DIAGNOSIS — I110 Hypertensive heart disease with heart failure: Secondary | ICD-10-CM | POA: Insufficient documentation

## 2024-10-18 DIAGNOSIS — J90 Pleural effusion, not elsewhere classified: Secondary | ICD-10-CM | POA: Insufficient documentation

## 2024-10-18 DIAGNOSIS — Q2112 Patent foramen ovale: Secondary | ICD-10-CM | POA: Insufficient documentation

## 2024-10-18 DIAGNOSIS — I5022 Chronic systolic (congestive) heart failure: Secondary | ICD-10-CM | POA: Diagnosis not present

## 2024-10-18 DIAGNOSIS — Z79899 Other long term (current) drug therapy: Secondary | ICD-10-CM | POA: Diagnosis not present

## 2024-10-18 DIAGNOSIS — R001 Bradycardia, unspecified: Secondary | ICD-10-CM | POA: Insufficient documentation

## 2024-10-18 DIAGNOSIS — N200 Calculus of kidney: Secondary | ICD-10-CM | POA: Diagnosis not present

## 2024-10-18 LAB — BASIC METABOLIC PANEL WITH GFR
Anion gap: 10 (ref 5–15)
BUN: 24 mg/dL — ABNORMAL HIGH (ref 8–23)
CO2: 24 mmol/L (ref 22–32)
Calcium: 9.5 mg/dL (ref 8.9–10.3)
Chloride: 105 mmol/L (ref 98–111)
Creatinine, Ser: 1.11 mg/dL (ref 0.61–1.24)
GFR, Estimated: >60 mL/min
Glucose, Bld: 99 mg/dL (ref 70–99)
Potassium: 4.4 mmol/L (ref 3.5–5.1)
Sodium: 139 mmol/L (ref 135–145)

## 2024-10-18 LAB — PRO BRAIN NATRIURETIC PEPTIDE: Pro Brain Natriuretic Peptide: 106 pg/mL

## 2024-10-18 MED ORDER — SPIRONOLACTONE 25 MG PO TABS: 25.0000 mg | ORAL_TABLET | Freq: Every day | ORAL | 3 refills | Status: AC

## 2024-10-18 MED ORDER — FUROSEMIDE 40 MG PO TABS: 40.0000 mg | ORAL_TABLET | Freq: Every day | ORAL | 3 refills | Status: AC | PRN

## 2024-10-18 MED ORDER — EMPAGLIFLOZIN 10 MG PO TABS: 10.0000 mg | ORAL_TABLET | Freq: Every day | ORAL | 3 refills | Status: AC

## 2024-10-18 MED ORDER — METOPROLOL SUCCINATE ER 25 MG PO TB24: 12.5000 mg | ORAL_TABLET | Freq: Every day | ORAL | 3 refills | Status: AC

## 2024-10-18 MED ORDER — ATORVASTATIN CALCIUM 40 MG PO TABS: 40.0000 mg | ORAL_TABLET | Freq: Every day | ORAL | 3 refills | Status: AC

## 2024-10-18 MED ORDER — SACUBITRIL-VALSARTAN 24-26 MG PO TABS: 1.0000 | ORAL_TABLET | Freq: Two times a day (BID) | ORAL | 3 refills | Status: DC

## 2024-10-18 NOTE — Patient Instructions (Signed)
 Medication Changes:  No Changes In Medications at this time.   Lab Work:  Labs done today, your results will be available in MyChart, we will contact you for abnormal readings.  Follow-Up in: as scheduled   At the [facility], you and your health needs are our priority. We have a designated team specialized in the treatment of Heart Failure. This Care Team includes your primary Heart Failure Specialized Cardiologist (physician), Advanced Practice Providers (APPs- Physician Assistants and Nurse Practitioners), and Pharmacist who all work together to provide you with the care you need, when you need it.   You may see any of the following providers on your designated Care Team at your next follow up:  Dr. Toribio Fuel Dr. Ezra Shuck Dr. Odis Brownie Greig Mosses, NP Caffie Shed, [place] [facility] [place], [place] Beckey Coe, NP Jordan Lee, NP Tinnie Redman, PharmD   Please be sure to bring in all your medications bottles to every appointment.   Need to Contact Us :  If you have any questions or concerns before your next appointment please send us  a message through [place] or call our office at [phone].    TO LEAVE A MESSAGE FOR THE NURSE SELECT OPTION 2, PLEASE LEAVE A MESSAGE INCLUDING: YOUR NAME DATE OF BIRTH CALL BACK NUMBER REASON FOR CALL**this is important as we prioritize the call backs  YOU WILL RECEIVE A CALL BACK THE SAME DAY AS LONG AS YOU CALL BEFORE 4:00 PM

## 2024-11-05 ENCOUNTER — Ambulatory Visit (HOSPITAL_COMMUNITY)
Admission: RE | Admit: 2024-11-05 | Discharge: 2024-11-05 | Disposition: A | Discharge status: Home / Self Care | Source: Ambulatory Visit | Attending: Cardiology | Admitting: Cardiology

## 2024-11-05 ENCOUNTER — Encounter (HOSPITAL_COMMUNITY): Payer: Self-pay | Admitting: Cardiology

## 2024-11-05 ENCOUNTER — Ambulatory Visit (HOSPITAL_COMMUNITY): Payer: Self-pay | Admitting: Cardiology

## 2024-11-05 VITALS — BP 124/70 | HR 61 | Wt 224.8 lb

## 2024-11-05 DIAGNOSIS — Z7984 Long term (current) use of oral hypoglycemic drugs: Secondary | ICD-10-CM | POA: Insufficient documentation

## 2024-11-05 DIAGNOSIS — Z79899 Other long term (current) drug therapy: Secondary | ICD-10-CM | POA: Insufficient documentation

## 2024-11-05 DIAGNOSIS — I5022 Chronic systolic (congestive) heart failure: Secondary | ICD-10-CM | POA: Diagnosis not present

## 2024-11-05 DIAGNOSIS — G250 Essential tremor: Secondary | ICD-10-CM | POA: Insufficient documentation

## 2024-11-05 DIAGNOSIS — I480 Paroxysmal atrial fibrillation: Secondary | ICD-10-CM | POA: Insufficient documentation

## 2024-11-05 DIAGNOSIS — I4892 Unspecified atrial flutter: Secondary | ICD-10-CM | POA: Insufficient documentation

## 2024-11-05 DIAGNOSIS — Z7901 Long term (current) use of anticoagulants: Secondary | ICD-10-CM | POA: Insufficient documentation

## 2024-11-05 DIAGNOSIS — E785 Hyperlipidemia, unspecified: Secondary | ICD-10-CM | POA: Insufficient documentation

## 2024-11-05 LAB — BASIC METABOLIC PANEL WITH GFR
Anion gap: 12 (ref 5–15)
BUN: 24 mg/dL — ABNORMAL HIGH (ref 8–23)
CO2: 21 mmol/L — ABNORMAL LOW (ref 22–32)
Calcium: 9.6 mg/dL (ref 8.9–10.3)
Chloride: 105 mmol/L (ref 98–111)
Creatinine, Ser: 1 mg/dL (ref 0.61–1.24)
GFR, Estimated: >60 mL/min
Glucose, Bld: 101 mg/dL — ABNORMAL HIGH (ref 70–99)
Potassium: 4.7 mmol/L (ref 3.5–5.1)
Sodium: 138 mmol/L (ref 135–145)

## 2024-11-05 LAB — LIPID PANEL
Cholesterol: 142 mg/dL (ref 0–200)
HDL: 27 mg/dL — ABNORMAL LOW
LDL Cholesterol: 55 mg/dL (ref 0–99)
Total CHOL/HDL Ratio: 5.3 ratio
Triglycerides: 299 mg/dL — ABNORMAL HIGH
VLDL: 60 mg/dL — ABNORMAL HIGH (ref 0–40)

## 2024-11-05 LAB — PRO BRAIN NATRIURETIC PEPTIDE: Pro Brain Natriuretic Peptide: 129 pg/mL

## 2024-11-05 MED ORDER — SACUBITRIL-VALSARTAN 49-51 MG PO TABS: 1.0000 | ORAL_TABLET | Freq: Two times a day (BID) | ORAL | 11 refills | Status: AC

## 2024-11-05 NOTE — Patient Instructions (Signed)
 CHANGE Entresto  to 49/51 mg Twice daily  Labs done today, your results will be available in MyChart, we will contact you for abnormal readings.  REPEAT BLOOD WORK IN 10 DAYS.  Your physician has requested that you have an echocardiogram. Echocardiography is a painless test that uses sound waves to create images of your heart. It provides your doctor with information about the size and shape of your heart and how well your hearts chambers and valves are working. This procedure takes approximately one hour. There are no restrictions for this procedure. Please do NOT wear cologne, perfume, aftershave, or lotions (deodorant is allowed). Please arrive 15 minutes prior to your appointment time.  Please note: We ask at that you not bring children with you during ultrasound (echo/ vascular) testing. Due to room size and safety concerns, children are not allowed in the ultrasound rooms during exams. Our front office staff cannot provide observation of children in our lobby area while testing is being conducted. An adult accompanying a patient to their appointment will only be allowed in the ultrasound room at the discretion of the ultrasound technician under special circumstances. We apologize for any inconvenience.  You have been referred to the Electrophysiologist. They will call you to arrange your appointment.  Your physician recommends that you schedule a follow-up appointment in: 2 months.  If you have any questions or concerns before your next appointment please send us  a message through [place] or call our office at [phone].    TO LEAVE A MESSAGE FOR THE NURSE SELECT OPTION 2, PLEASE LEAVE A MESSAGE INCLUDING: YOUR NAME DATE OF BIRTH CALL BACK NUMBER REASON FOR CALL**this is important as we prioritize the call backs  YOU WILL RECEIVE A CALL BACK THE SAME DAY AS LONG AS YOU CALL BEFORE 4:00 PM  At the [facility], you and your health needs are our priority. As part of  our continuing mission to provide you with exceptional heart care, we have created designated Provider Care Teams. These Care Teams include your primary Cardiologist (physician) and Advanced Practice Providers (APPs- Physician Assistants and Nurse Practitioners) who all work together to provide you with the care you need, when you need it.   You may see any of the following providers on your designated Care Team at your next follow up: Dr Toribio Fuel Dr Ezra Shuck Dr. Morene Brownie Greig Mosses, NP Caffie Shed, [place] [facility] [place], [place] Beckey Coe, NP Jordan Lee, NP Ellouise Class, NP Tinnie Redman, PharmD Jaun Bash, PharmD   Please be sure to bring in all your medications bottles to every appointment.    Thank you for choosing [facility]

## 2024-11-05 NOTE — Progress Notes (Signed)
 "  ADVANCED HF CLINIC NOTE  Primary Care: Catherine Charlies LABOR, DO HF MD: Dr Rolan   Chief Complaint: Heart Failure   HPI: 74 y.o. male with PAF,  HLD, essential tremor, and chronic HFrEF.    Diagnosed  with bronchitis 09/05/24  by PCP and treated with doxycycline .  He presented to ED 09/11/24 with 2 weeks of progressive shortness of breath. In ED, BP 179/135, T 97.42F, HR 118. ECG showed atrial flutter rate 123 bpm. CXR showed cardiomegaly and pulmonary edema. CTA negative for PE, but showed vascular congestion, small to moderate bilateral pleural effusion, enlarged main PE, incidental nonobstructive L renal stone. He was started on amiodarone  gtt and heparin  gtt and given IV Lasix . He underwent TEE on 09/12/24 showing EF 25-30%, mild RV dysfunction, small PFO.  Due to evidence for significant volume overload, DCCV deferred until he was effectively diuresed. DCCV done prior to discharge with restoration of NSR.   He returns for followup of CHF.  He remains in NSR.  No palpitations.  He has no dyspnea with normal activities, swims at the [facility] for exercise.  He is retired but keeps busy.  He gets occasional atypical chest pain which he suspects is GERD (occurs after eating generally).  He was able to shovel snow with no problems.  No lightheadedness.  No orthopnea/PND.  Weight is down 1 lb.  He has cut back considerably on ETOH intake, now only occasional glass of wine on weekends.   ECV (personally reviewed): NSR, 1st degree AVB, RBBB, LAFB  Labs (6/25): LDL 108 Labs (12/25): TSH 7.3, free T4 normal Labs (1/26): pro-BNP 106, K 4.4, creatinine 1.11  PMH: 1. Essential tremor 2. Hyperlipidemia 3. HTN 4. H/o melanoma 5. H/o squamous cell CA of skin 6. Atrial flutter: 12/25, treated with DCCV.   7. Chronic systolic CHF: ?Tachycardia-mediated cardiomyopathy with AFL with RVR.  - TEE (12/25): EF 25-30%, mild RV dysfunction, small PFO.  8. PFO  SH: Married, retired. Lives in [place]. No smoking.  Prior heavy ETOH, has cut back.   FH: Father with MI.   Current Outpatient Medications  Medication Sig Dispense Refill   Acetaminophen  (TYLENOL  PO) Take 2-4 tablets by mouth as needed.     apixaban  (ELIQUIS ) 5 MG TABS tablet Take 1 tablet (5 mg total) by mouth 2 (two) times daily. 60 tablet 3   atorvastatin  (LIPITOR) 40 MG tablet Take 1 tablet (40 mg total) by mouth daily. 90 tablet 3   empagliflozin  (JARDIANCE ) 10 MG TABS tablet Take 1 tablet (10 mg total) by mouth daily. 90 tablet 3   furosemide  (LASIX ) 40 MG tablet Take 1 tablet (40 mg total) by mouth daily as needed for edema or fluid (in case of weight gain 2 to 3 lbs in 24 hrs or 5 lbs in 7 days). 30 tablet 3   LORazepam  (ATIVAN ) 1 MG tablet Take 1 tablet (1 mg total) by mouth 2 (two) times daily as needed for anxiety. 180 tablet 1   metoprolol  succinate (TOPROL -XL) 25 MG 24 hr tablet Take 0.5 tablets (12.5 mg total) by mouth at bedtime. 45 tablet 3   omega-3 acid ethyl esters (LOVAZA ) 1 g capsule Take 2 capsules (2 g total) by mouth 2 (two) times daily. 360 capsule 3   potassium chloride  SA (KLOR-CON  M) 20 MEQ tablet Take 2 tablets (40 mEq total) by mouth daily as needed (take only when taking furosemide ). 60 tablet 3   sacubitril -valsartan  (ENTRESTO ) 49-51 MG Take 1 tablet by mouth  2 (two) times daily. 60 tablet 11   spironolactone  (ALDACTONE ) 25 MG tablet Take 1 tablet (25 mg total) by mouth daily. 90 tablet 3   traMADol  (ULTRAM ) 50 MG tablet Take 1-2 tablets (50-100 mg total) by mouth every 12 (twelve) hours as needed (mild pain). 180 tablet 1   No current facility-administered medications for this encounter.     Vitals:   11/05/24 1449  BP: 124/70  Pulse: 61  SpO2: 96%  Weight: 102 kg (224 lb 12.8 oz)    Wt Readings from Last 3 Encounters:  11/05/24 102 kg (224 lb 12.8 oz)  10/18/24 102 kg (224 lb 12.8 oz)  09/28/24 102.1 kg (225 lb)    PHYSICAL EXAM: General: NAD Neck: No JVD, no thyromegaly or thyroid  nodule.   Lungs: Clear to auscultation bilaterally with normal respiratory effort. CV: Nondisplaced PMI.  Heart regular S1/S2, no S3/S4, no murmur.  No peripheral edema.  No carotid bruit.  Normal pedal pulses.  Abdomen: Soft, nontender, no hepatosplenomegaly, no distention.  Skin: Intact without lesions or rashes.  Neurologic: Alert and oriented x 3.  Psych: Normal affect. Extremities: No clubbing or cyanosis. Varicose veins.  HEENT: Normal.   ASSESSMENT & PLAN: 1. Chronic systolic CHF: TEE in 12/25 showed EF 25-30% mod LVH, mildly RV dysfunction, mild AI, IVC dilated. No prior cardiac history. Father had MI.  Was found to be in atrial flutter with RVR when he was admitted with CHF, ?tachycardia-mediated cardiomyopathy.  He also was a fairly heavy drinker and has cut back.  He was cardioverted and has remained in NSR.  NYHA class I-II, not volume overloaded on exam.   - Arrange for repeat echo.  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  If not, will need cardiac cath to assess for CAD as cause of cardiomyopathy.   - Continue prn Lasix .  - Continue Toprol  XL 12.5 mg daily. Have not increased with bradycardia.  - Increase Entresto  to 49/51 bid.  BMET/BNP today, BMET in 10 days.  - Continue spironolactone  25 mg daily  - Continue Jardiance  10 mg daily  - Continue to limit ETOH intake.  2. Atrial flutter: Noted in 12/25, looked typical. Now s/p DCCV and remains in NSR since then. He was on amiodarone  but this was stopped with bradycardia.  - Continue Eliquis .  - Refer to EP for atrial flutter ablation.  3. Bradycardia: Resolved.  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  Significant conduction system disease.  - Maintain low dose beta blocker.  4. Hyperlipidemia: He is on statin, father with MI.  - Check lipids.   I spent 32 minutes reviewing records, interviewing/examining patient, and managing orders.   Ezra Shuck  11/05/24   [facility] [place] [address] [facility] [place] [address] [phone] (office)  "

## 2024-11-23 ENCOUNTER — Ambulatory Visit (HOSPITAL_COMMUNITY)

## 2025-01-03 ENCOUNTER — Other Ambulatory Visit (HOSPITAL_COMMUNITY)

## 2025-01-03 ENCOUNTER — Ambulatory Visit (HOSPITAL_COMMUNITY)
